# Patient Record
Sex: Male | Born: 1960
Health system: Southern US, Community
[De-identification: ages and names within clinical notes are randomized; demographics above are authoritative.]

## PROBLEM LIST (undated history)

## (undated) DIAGNOSIS — I493 Ventricular premature depolarization: Secondary | ICD-10-CM

## (undated) DIAGNOSIS — I10 Essential (primary) hypertension: Secondary | ICD-10-CM

## (undated) DIAGNOSIS — E785 Hyperlipidemia, unspecified: Secondary | ICD-10-CM

## (undated) DIAGNOSIS — E119 Type 2 diabetes mellitus without complications: Secondary | ICD-10-CM

## (undated) DIAGNOSIS — I2510 Atherosclerotic heart disease of native coronary artery without angina pectoris: Secondary | ICD-10-CM

## (undated) DIAGNOSIS — I5032 Chronic diastolic (congestive) heart failure: Secondary | ICD-10-CM

## (undated) DIAGNOSIS — I4891 Unspecified atrial fibrillation: Secondary | ICD-10-CM

## (undated) DIAGNOSIS — I509 Heart failure, unspecified: Secondary | ICD-10-CM

## (undated) DIAGNOSIS — C801 Malignant (primary) neoplasm, unspecified: Secondary | ICD-10-CM

## (undated) DIAGNOSIS — G4730 Sleep apnea, unspecified: Secondary | ICD-10-CM

## (undated) DIAGNOSIS — I219 Acute myocardial infarction, unspecified: Secondary | ICD-10-CM

## (undated) HISTORY — DX: Atherosclerotic heart disease of native coronary artery without angina pectoris: I25.10

## (undated) HISTORY — PX: INCISIONAL HERNIA REPAIR: SHX193

## (undated) HISTORY — DX: Chronic diastolic (congestive) heart failure: I50.32

## (undated) HISTORY — DX: Hyperlipidemia, unspecified: E78.5

## (undated) HISTORY — PX: ROTATOR CUFF REPAIR: SHX139

## (undated) HISTORY — DX: Ventricular premature depolarization: I49.3

## (undated) HISTORY — DX: Essential (primary) hypertension: I10

## (undated) HISTORY — DX: Type 2 diabetes mellitus without complications: E11.9

## (undated) HISTORY — DX: Sleep apnea, unspecified: G47.30

## (undated) HISTORY — PX: TONSILLECTOMY: SHX5217

## (undated) HISTORY — DX: Heart failure, unspecified: I50.9

## (undated) HISTORY — PX: HERNIA REPAIR: SHX51

## (undated) HISTORY — DX: Malignant (primary) neoplasm, unspecified: C80.1

## (undated) HISTORY — DX: Unspecified atrial fibrillation: I48.91

## (undated) HISTORY — PX: OTHER SURGICAL HISTORY: SHX169

## (undated) HISTORY — PX: HEMORRHOID BANDING: SHX5850

## (undated) HISTORY — PX: SKIN CANCER EXCISION: SHX779

---

## 2002-03-19 HISTORY — PX: CARDIAC CATHETERIZATION: SHX172

## 2002-07-24 ENCOUNTER — Inpatient Hospital Stay (HOSPITAL_COMMUNITY): Admission: AD | Admit: 2002-07-24 | Discharge: 2002-07-27 | Payer: Self-pay | Admitting: Emergency Medicine

## 2002-08-17 ENCOUNTER — Encounter (HOSPITAL_COMMUNITY): Admission: RE | Admit: 2002-08-17 | Discharge: 2002-11-15 | Payer: Self-pay | Admitting: Cardiology

## 2002-11-16 ENCOUNTER — Encounter (HOSPITAL_COMMUNITY): Admission: RE | Admit: 2002-11-16 | Discharge: 2002-12-17 | Payer: Self-pay | Admitting: Cardiology

## 2004-05-09 ENCOUNTER — Ambulatory Visit: Payer: Self-pay | Admitting: Cardiology

## 2004-05-16 ENCOUNTER — Ambulatory Visit: Payer: Self-pay | Admitting: Cardiology

## 2005-09-18 ENCOUNTER — Ambulatory Visit: Payer: Self-pay | Admitting: Cardiology

## 2006-11-06 ENCOUNTER — Ambulatory Visit: Payer: Self-pay | Admitting: Cardiology

## 2006-11-20 ENCOUNTER — Ambulatory Visit: Payer: Self-pay | Admitting: Cardiology

## 2007-10-17 ENCOUNTER — Encounter: Payer: Self-pay | Admitting: Cardiology

## 2007-11-28 ENCOUNTER — Ambulatory Visit: Payer: Self-pay | Admitting: Cardiology

## 2007-12-01 ENCOUNTER — Encounter: Payer: Self-pay | Admitting: Cardiology

## 2007-12-11 ENCOUNTER — Ambulatory Visit: Payer: Self-pay | Admitting: Cardiology

## 2007-12-19 ENCOUNTER — Ambulatory Visit: Payer: Self-pay | Admitting: Cardiovascular Disease

## 2007-12-19 ENCOUNTER — Encounter: Payer: Self-pay | Admitting: Cardiology

## 2007-12-19 ENCOUNTER — Ambulatory Visit: Payer: Self-pay

## 2007-12-26 ENCOUNTER — Ambulatory Visit: Payer: Self-pay | Admitting: Internal Medicine

## 2008-01-02 ENCOUNTER — Ambulatory Visit: Payer: Self-pay | Admitting: Cardiology

## 2008-01-09 ENCOUNTER — Ambulatory Visit: Payer: Self-pay | Admitting: Internal Medicine

## 2008-01-16 ENCOUNTER — Ambulatory Visit: Payer: Self-pay | Admitting: Internal Medicine

## 2008-01-17 ENCOUNTER — Encounter: Payer: Self-pay | Admitting: Cardiology

## 2008-01-20 ENCOUNTER — Ambulatory Visit: Payer: Self-pay | Admitting: Cardiology

## 2008-01-26 ENCOUNTER — Ambulatory Visit: Payer: Self-pay | Admitting: Cardiology

## 2008-02-09 ENCOUNTER — Ambulatory Visit: Payer: Self-pay | Admitting: Internal Medicine

## 2008-02-11 ENCOUNTER — Encounter: Payer: Self-pay | Admitting: Cardiology

## 2008-02-11 ENCOUNTER — Ambulatory Visit: Payer: Self-pay | Admitting: Cardiology

## 2008-02-11 DIAGNOSIS — E781 Pure hyperglyceridemia: Secondary | ICD-10-CM | POA: Insufficient documentation

## 2008-02-11 DIAGNOSIS — I4891 Unspecified atrial fibrillation: Secondary | ICD-10-CM | POA: Insufficient documentation

## 2008-02-11 DIAGNOSIS — I10 Essential (primary) hypertension: Secondary | ICD-10-CM | POA: Insufficient documentation

## 2008-02-11 DIAGNOSIS — I2510 Atherosclerotic heart disease of native coronary artery without angina pectoris: Secondary | ICD-10-CM | POA: Insufficient documentation

## 2008-06-30 ENCOUNTER — Encounter: Payer: Self-pay | Admitting: Cardiology

## 2008-08-18 ENCOUNTER — Ambulatory Visit: Payer: Self-pay | Admitting: Cardiology

## 2008-09-21 ENCOUNTER — Encounter: Payer: Self-pay | Admitting: Cardiology

## 2008-09-27 ENCOUNTER — Ambulatory Visit: Payer: Self-pay | Admitting: Cardiology

## 2008-09-27 ENCOUNTER — Encounter: Payer: Self-pay | Admitting: Cardiology

## 2008-09-27 DIAGNOSIS — E785 Hyperlipidemia, unspecified: Secondary | ICD-10-CM | POA: Insufficient documentation

## 2008-09-30 ENCOUNTER — Ambulatory Visit: Payer: Self-pay | Admitting: Cardiology

## 2008-09-30 LAB — CONVERTED CEMR LAB
POC INR: 1.2
Prothrombin Time: 13.8 s

## 2008-10-07 ENCOUNTER — Ambulatory Visit: Payer: Self-pay | Admitting: Cardiovascular Disease

## 2008-10-07 LAB — CONVERTED CEMR LAB
POC INR: 1.9
Prothrombin Time: 17 s

## 2008-10-14 ENCOUNTER — Ambulatory Visit: Payer: Self-pay | Admitting: Cardiovascular Disease

## 2008-10-14 LAB — CONVERTED CEMR LAB: Prothrombin Time: 22.2 s

## 2008-10-21 ENCOUNTER — Ambulatory Visit: Payer: Self-pay | Admitting: Cardiology

## 2008-10-21 LAB — CONVERTED CEMR LAB: POC INR: 2.3

## 2008-10-28 ENCOUNTER — Ambulatory Visit: Payer: Self-pay | Admitting: Internal Medicine

## 2008-11-01 ENCOUNTER — Encounter: Payer: Self-pay | Admitting: *Deleted

## 2008-11-03 ENCOUNTER — Ambulatory Visit: Payer: Self-pay | Admitting: Cardiology

## 2008-11-05 ENCOUNTER — Encounter: Payer: Self-pay | Admitting: Cardiology

## 2008-11-12 ENCOUNTER — Ambulatory Visit: Payer: Self-pay | Admitting: Cardiovascular Disease

## 2008-11-12 LAB — CONVERTED CEMR LAB: POC INR: 2.4

## 2008-11-18 ENCOUNTER — Ambulatory Visit: Payer: Self-pay | Admitting: Cardiology

## 2008-11-18 LAB — CONVERTED CEMR LAB: POC INR: 2.4

## 2008-11-25 ENCOUNTER — Ambulatory Visit: Payer: Self-pay | Admitting: Cardiovascular Disease

## 2008-11-29 ENCOUNTER — Ambulatory Visit: Payer: Self-pay | Admitting: Cardiology

## 2008-12-02 ENCOUNTER — Encounter: Payer: Self-pay | Admitting: Cardiology

## 2008-12-02 ENCOUNTER — Ambulatory Visit: Payer: Self-pay | Admitting: Cardiology

## 2008-12-02 LAB — CONVERTED CEMR LAB: POC INR: 2.4

## 2008-12-03 ENCOUNTER — Ambulatory Visit: Payer: Self-pay | Admitting: Cardiovascular Disease

## 2008-12-03 ENCOUNTER — Encounter: Payer: Self-pay | Admitting: Cardiology

## 2008-12-03 ENCOUNTER — Ambulatory Visit (HOSPITAL_COMMUNITY): Admission: RE | Admit: 2008-12-03 | Discharge: 2008-12-03 | Payer: Self-pay | Admitting: Cardiovascular Disease

## 2008-12-06 ENCOUNTER — Ambulatory Visit: Payer: Self-pay | Admitting: Cardiology

## 2008-12-06 LAB — CONVERTED CEMR LAB: POC INR: 2.1

## 2008-12-16 ENCOUNTER — Ambulatory Visit: Payer: Self-pay | Admitting: Internal Medicine

## 2008-12-16 LAB — CONVERTED CEMR LAB: POC INR: 2.3

## 2009-01-07 ENCOUNTER — Ambulatory Visit: Payer: Self-pay | Admitting: Cardiology

## 2009-01-25 ENCOUNTER — Encounter: Admission: RE | Admit: 2009-01-25 | Discharge: 2009-01-25 | Payer: Self-pay | Admitting: Family Medicine

## 2009-07-11 ENCOUNTER — Encounter: Payer: Self-pay | Admitting: Cardiology

## 2009-07-18 ENCOUNTER — Ambulatory Visit: Payer: Self-pay | Admitting: Cardiology

## 2009-07-22 ENCOUNTER — Ambulatory Visit: Payer: Self-pay | Admitting: Internal Medicine

## 2009-07-28 ENCOUNTER — Ambulatory Visit: Payer: Self-pay | Admitting: Internal Medicine

## 2009-08-04 ENCOUNTER — Ambulatory Visit: Payer: Self-pay | Admitting: Internal Medicine

## 2009-08-11 ENCOUNTER — Ambulatory Visit: Payer: Self-pay | Admitting: Cardiology

## 2009-08-22 ENCOUNTER — Ambulatory Visit: Payer: Self-pay | Admitting: Internal Medicine

## 2009-08-22 ENCOUNTER — Ambulatory Visit: Payer: Self-pay | Admitting: Cardiology

## 2009-08-22 LAB — CONVERTED CEMR LAB: POC INR: 2.6

## 2009-08-24 ENCOUNTER — Encounter: Payer: Self-pay | Admitting: Cardiology

## 2009-08-24 ENCOUNTER — Ambulatory Visit (HOSPITAL_COMMUNITY): Admission: RE | Admit: 2009-08-24 | Discharge: 2009-08-24 | Payer: Self-pay | Admitting: Cardiovascular Disease

## 2009-08-24 ENCOUNTER — Ambulatory Visit: Payer: Self-pay | Admitting: Cardiovascular Disease

## 2009-08-26 ENCOUNTER — Ambulatory Visit: Payer: Self-pay | Admitting: Cardiovascular Disease

## 2009-08-26 LAB — CONVERTED CEMR LAB: POC INR: 3

## 2009-09-08 ENCOUNTER — Ambulatory Visit: Payer: Self-pay | Admitting: Cardiology

## 2009-09-14 ENCOUNTER — Encounter: Payer: Self-pay | Admitting: Cardiology

## 2009-09-23 ENCOUNTER — Encounter: Payer: Self-pay | Admitting: Cardiology

## 2009-09-29 ENCOUNTER — Ambulatory Visit: Payer: Self-pay | Admitting: Internal Medicine

## 2009-12-12 ENCOUNTER — Encounter: Payer: Self-pay | Admitting: Internal Medicine

## 2009-12-27 ENCOUNTER — Encounter: Payer: Self-pay | Admitting: Cardiology

## 2010-03-09 ENCOUNTER — Encounter: Payer: Self-pay | Admitting: Cardiology

## 2010-03-09 ENCOUNTER — Ambulatory Visit: Payer: Self-pay | Admitting: Cardiology

## 2010-03-17 ENCOUNTER — Ambulatory Visit: Payer: Self-pay | Admitting: Cardiology

## 2010-03-23 ENCOUNTER — Ambulatory Visit: Admission: RE | Admit: 2010-03-23 | Discharge: 2010-03-23 | Payer: Self-pay | Source: Home / Self Care

## 2010-03-23 LAB — CONVERTED CEMR LAB: POC INR: 2.5

## 2010-03-30 ENCOUNTER — Ambulatory Visit: Admission: RE | Admit: 2010-03-30 | Discharge: 2010-03-30 | Payer: Self-pay | Source: Home / Self Care

## 2010-03-30 LAB — CONVERTED CEMR LAB: POC INR: 2.5

## 2010-04-06 ENCOUNTER — Ambulatory Visit: Admission: RE | Admit: 2010-04-06 | Discharge: 2010-04-06 | Payer: Self-pay | Source: Home / Self Care

## 2010-04-06 LAB — CONVERTED CEMR LAB: POC INR: 3.3

## 2010-04-13 ENCOUNTER — Ambulatory Visit: Admission: RE | Admit: 2010-04-13 | Discharge: 2010-04-13 | Payer: Self-pay | Source: Home / Self Care

## 2010-04-13 LAB — CONVERTED CEMR LAB: POC INR: 2.7

## 2010-04-16 LAB — CONVERTED CEMR LAB
Basophils Relative: 0.6 % (ref 0.0–3.0)
CO2: 30 meq/L (ref 19–32)
Chloride: 101 meq/L (ref 96–112)
Eosinophils Absolute: 0.1 10*3/uL (ref 0.0–0.7)
HCT: 45.5 % (ref 39.0–52.0)
Hemoglobin: 15.6 g/dL (ref 13.0–17.0)
Lymphocytes Relative: 34.4 % (ref 12.0–46.0)
Lymphs Abs: 2.5 10*3/uL (ref 0.7–4.0)
MCHC: 34.2 g/dL (ref 30.0–36.0)
MCV: 90.2 fL (ref 78.0–100.0)
Monocytes Absolute: 0.7 10*3/uL (ref 0.1–1.0)
Neutro Abs: 3.9 10*3/uL (ref 1.4–7.7)
RBC: 5.05 M/uL (ref 4.22–5.81)
Sodium: 141 meq/L (ref 135–145)

## 2010-04-18 NOTE — Medication Information (Signed)
Summary: rov/sp  Anticoagulant Therapy  Managed by: Inactive Referring MD: deGent PCP: [facility] [address], Dr. Chilton Si Supervising MD: Graciela Husbands MD, Viviann Spare Indication 1: Atrial Fibrillation (ICD-427.31) Indication 2: Atrial Fibrillation Lab Used: [facility] [place] Site: [address] INR POC 1.6 INR RANGE 2-3  Dietary changes: no    Health status changes: no    Bleeding/hemorrhagic complications: no    Recent/future hospitalizations: yes       Details: cardioversion was on 6/6, coumadin can be d/c 4 weeks post procedure per Dr. Andee Lineman office note from 6/23  Any changes in medication regimen? no    Recent/future dental: no  Any missed doses?: no       Is patient compliant with meds? yes       Allergies: No Known Drug Allergies  Anticoagulation Management History:      The patient is taking warfarin and comes in today for a routine follow up visit.  Negative risk factors for bleeding include an age less than 60 years old.  The bleeding index is 'low risk'.  Positive CHADS2 values include History of HTN.  Negative CHADS2 values include Age > 12 years old.  The start date was 11/28/2007.  His last INR was 5.6 RATIO.  Anticoagulation responsible provider: Graciela Husbands MD, Viviann Spare.  INR POC: 1.6.  Cuvette Lot#: [id number].  Exp: 11/2010.    Anticoagulation Management Assessment/Plan:      The patient's current anticoagulation dose is Coumadin 5 mg tabs: take as directed per coumadin clinic.  The target INR is 2 - 3.  The next INR is due 09/26/2009.  Anticoagulation instructions were given to patient.  Results were reviewed/authorized by Inactive.  He was notified by Dillard Cannon.         Prior Anticoagulation Instructions: INR 3.0  Continue same dose of 1 tablet every day.   Current Anticoagulation Instructions: INR 1.6  Stop coumadin. 4 weeks post procedure.

## 2010-04-18 NOTE — Medication Information (Signed)
Summary: rov/sp  Anticoagulant Therapy  Managed by: Weston Brass, PharmD Referring MD: Andee Lineman PCP: [facility] [address] Dr. Chilton Si Supervising MD: Antoine Poche MD, Fayrene Fearing Indication 1: Atrial Fibrillation (ICD-427.31) Indication 2: Atrial Fibrillation Lab Used: [facility] Site: [facility] INR POC 2.6 INR RANGE 2-3  Dietary changes: no    Health status changes: yes       Details: seeing Dr. Johney Frame today to discuss possible cardioversion  Bleeding/hemorrhagic complications: no    Recent/future hospitalizations: no    Any changes in medication regimen? no    Recent/future dental: no  Any missed doses?: no       Is patient compliant with meds? yes       Allergies: No Known Drug Allergies  Anticoagulation Management History:      The patient is taking warfarin and comes in today for a routine follow up visit.  Negative risk factors for bleeding include an age less than 67 years old.  The bleeding index is 'low risk'.  Positive CHADS2 values include History of HTN.  Negative CHADS2 values include Age > 65 years old.  The start date was 11/28/2007.  His last INR was 5.6 RATIO.  Anticoagulation responsible provider: Antoine Poche MD, Fayrene Fearing.  INR POC: 2.6.  Cuvette Lot#: [id number].  Exp: 10/2010.    Anticoagulation Management Assessment/Plan:      The patient's current anticoagulation dose is Coumadin 5 mg tabs: take as directed per coumadin clinic.  The target INR is 2 - 3.  The next INR is due 08/29/2009.  Anticoagulation instructions were given to patient.  Results were reviewed/authorized by Weston Brass, PharmD.  He was notified by Weston Brass PharmD.         Prior Anticoagulation Instructions: INR 2.7  Continue same dose of 1 tablet every day.    Current Anticoagulation Instructions: INR 2.6  Continue same dose of 1 tablet every day.  F/U after cardioversion.

## 2010-04-18 NOTE — Assessment & Plan Note (Signed)
Summary: nep/afib/eval for ablation   Visit Type:  Initial Consult Referring Provider:  Dr Andee Lineman Primary Provider:  [facility] [address], Dr. Chilton Si  CC:  afib.  History of Present Illness: Scott White is a pleasant 50 yo WM with a h/o persistent atrial fibrillation, CAD and obesity who presents today for EP consultation regarding afib.  He had an acute inferior wall MI 2004 for which he had BMS to RCA.  He did well thereafter.  He reports initially being diagnosed with afib late 2009 after presenting for annual exam with Dr Andee Lineman.  He was cardioverted 11/09 and treated with coumadin.  He did well until 4-5 months later, when he developed sypmtoms of fatigue and decreased exercise tolerance.  He was again cardioverted 9/10.  He reports feeling "much better" after cardioversion.  He was not placed on an antiarrhythmic medicine.  He did well until 4/11 when he again developed fatigue and decreased exercise tolerance.  He was again found to have afib.  He was restarted on coumadin and subsequently on multaq 400mg  two times a day.  He reports tolerating multaq well, without difficulty.  Current Medications (verified): 1)  Fish Oil 1000 Mg Caps (Omega-3 Fatty Acids) .... Take 2 Capsules By Mouth Two Times A Day 2)  Nitroglycerin 0.4 Mg Subl (Nitroglycerin) .... One Tablet Under Tongue Every 5 Minutes As Needed For Chest Pain---May Repeat Times Three 3)  Lisinopril-Hydrochlorothiazide 20-25 Mg Tabs (Lisinopril-Hydrochlorothiazide) .... Take 1 Tablet By Mouth Once A Day 4)  Crestor 5 Mg Tabs (Rosuvastatin Calcium) .... Take One Tablet By Mouth Daily. 5)  Niacin 100 Mg Tabs (Niacin) .... Take 1 Tablet By Mouth Once A Day 6)  Hemp Oil .... Take 2 Tablespoons Once Daily 7)  Coumadin 5 Mg Tabs (Warfarin Sodium) .... Take As Directed Per Coumadin Clinic 8)  Multaq 400 Mg Tabs (Dronedarone Hcl) .... Take 1 Tablet By Mouth Two Times A Day 9)  Aspirin 81 Mg Tbec (Aspirin) .... Take 1 Tablet Daily. 10)   Centrum Cardio  Tabs (Multiple Vitamins-Minerals) .... Take 1 Tablet Twice Daily.  Allergies (verified): No Known Drug Allergies  Past History:  Past Medical History: Persistent atrial fibrillation CHADS2 is one CAD- Status post acute inferior wall myocardial infarction emergent bare metal stenting of the mid RCA, May 2004,  residual nonobstructive coronary artery disease Ejection fraction 60% by echocardiography 04/2007 Hypertension Obesity HL  Past Surgical History: PCI RCA 2004  Family History: maternal family members with CAD/ early CAD (some in their 70s)  Social History: Pt lives in [place] alone.  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  Alcohol Use - 1 beer or glass of wine per day  Review of Systems       All systems are reviewed and negative except as listed in the HPI.   Vital Signs:  Patient profile:   50 year old male Height:      71 inches Weight:      222 pounds BMI:     31.07 Pulse rate:   63 / minute Pulse rhythm:   irregular BP sitting:   126 / 84  (left arm) Cuff size:   regular  Vitals Entered By: Judithe Modest CMA (August 22, 2009 11:41 AM)  Physical Exam  General:  Well developed, well nourished, in no acute distress. Head:  normocephalic and atraumatic Eyes:  PERRLA/EOM intact; conjunctiva and lids normal. Nose:  no deformity, discharge, inflammation, or lesions  Mouth:  Teeth, gums and palate normal. Oral mucosa normal. Neck:  Neck supple, no JVD. No masses, thyromegaly or abnormal cervical nodes. Lungs:  Clear bilaterally to auscultation and percussion. Heart:  irrr, no m/r/g Abdomen:  Bowel sounds positive; abdomen soft and non-tender without masses, organomegaly, or hernias noted. No hepatosplenomegaly. Msk:  Back normal, normal gait. Muscle strength and tone normal. Pulses:  pulses normal in all 4 extremities Extremities:  No clubbing or cyanosis. Neurologic:  Alert and oriented x 3. Skin:   Intact without lesions or rashes. Cervical Nodes:  no significant adenopathy Psych:  Normal affect.   EKG  Procedure date:  08/22/2009  Findings:      afib, V rates 60s,  QT 425,  PVCs  Impression & Recommendations:  Problem # 1:  ATRIAL FIBRILLATION (ICD-427.31) The patient has persistent symptomatic atrial fibrillation.  He has not previoulsy tried an antiarrhythmic medicine.  Therapeutic strategies for afib including medicine and ablation were discussed in detail with the patient today. Risk, benefits, and alternatives to EP study and radiofrequency ablation for afib were also discussed in detail today.  At this point, catheter ablation is reserved as a second line therapy for afib.  We will therefore proceed with cardioversion on multaq.  If he maintains sinus rhythm, then he should continue medical therapy.  If he has further afib, then we should consider catheter ablation at that time.  I have reviewed EKG from 2009 which revealed LA size of <68mm.  Given his persisent afib, I suspect that success rates are not greater than 70 % and would carry a 1/4 chance of needing more procedures.  Problem # 2:  ESSENTIAL HYPERTENSION, BENIGN (ICD-401.1)  stable  His updated medication list for this problem includes:    Lisinopril-hydrochlorothiazide 20-25 Mg Tabs (Lisinopril-hydrochlorothiazide) .[name]... Take 1 tablet by mouth once a day    Aspirin 81 Mg Tbec (Aspirin) .[name]... Take 1 tablet daily.  Orders: TLB-BMP (Basic Metabolic Panel-BMET) (80048-METABOL) TLB-CBC Platelet - w/Differential (85025-CBCD) TLB-PTT (85730-PTTL) TLB-Magnesium (Mg) (83735-MG)  Problem # 3:  CORONARY ATHEROSCLEROSIS NATIVE CORONARY ARTERY (ICD-414.01)  stable  His updated medication list for this problem includes:    Nitroglycerin 0.4 Mg Subl (Nitroglycerin) ..... One tablet under tongue every 5 minutes as needed for chest pain---may repeat times three    Lisinopril-hydrochlorothiazide 20-25 Mg Tabs  (Lisinopril-hydrochlorothiazide) .[name]... Take 1 tablet by mouth once a day    Coumadin 5 Mg Tabs (Warfarin sodium) .[name]... Take as directed per coumadin clinic    Aspirin 81 Mg Tbec (Aspirin) .[name]... Take 1 tablet daily.  Orders: TLB-BMP (Basic Metabolic Panel-BMET) (80048-METABOL) TLB-CBC Platelet - w/Differential (85025-CBCD) TLB-PTT (85730-PTTL) TLB-Magnesium (Mg) (83735-MG)  Other Orders: EKG w/ Interpretation (93000)  Patient Instructions: 1)  Your physician recommends that you schedule a follow-up appointment in: 3 months with Dr Johney Frame 2)  Your physician has recommended that you have a cardioversion (DCCV).  Electrical cardioversion uses a jolt of electricity to your heart either through paddles or wired patches attached to your chest. This is a controlled, usually prescheduled, procedure. Defibrillation is done under light anesthesia in the hospital, and you usually go home the day of the procedure. This is done to get your heart back into a normal rhythm. You are not awake for the procedure. Please see the instruction sheet given to you today.

## 2010-04-18 NOTE — Assessment & Plan Note (Signed)
Summary: 6 MONTH FU   Visit Type:  Follow-up Primary Provider:  [facility] [address], Dr. Chilton Si   History of Present Illness: the patient is a very pleasant 50 year old male with 2 prior cardioversions for paroxysmal atrial fibrillation. The patient also has a history of single-vessel coronary urgency status post bare-metal stent to the mid right coronary artery in 2004. The patient has been cardioverted in November 2009, September 2010 and now has noticed that for approximately months he's been back in atrial fibrillation. The patient is symptomatically due to fibrillation with decreased exercise tolerance and fatigue. The patient works out at [employer] and has noticed increased fatigue and decreased energy level. He denies ever any palpitations, presyncope or syncope. He also reports no recurrent substernal chest pain. An EKG today the patient is having a defibrillation with relative rate control and occasional PVCs.  Preventive Screening-Counseling & Management  Alcohol-Tobacco     Smoking Status: never  Current Medications (verified): 1)  Lipitor 20 Mg Tabs (Atorvastatin Calcium) .... Take One Tablet By Mouth Daily. 2)  Aspirin Ec 325 Mg Tbec (Aspirin) .... Take One Tablet By Mouth Daily 3)  Fish Oil 1000 Mg Caps (Omega-3 Fatty Acids) .... Take 2 Capsules By Mouth Two Times A Day 4)  Centrum Cardio  Tabs (Multiple Vitamins-Minerals) .... Take 1 Tablet By Mouth Two Times A Day 5)  Nitroglycerin 0.4 Mg Subl (Nitroglycerin) .... One Tablet Under Tongue Every 5 Minutes As Needed For Chest Pain---May Repeat Times Three 6)  Lisinopril-Hydrochlorothiazide 20-25 Mg Tabs (Lisinopril-Hydrochlorothiazide) .... Take 1 Tablet By Mouth Once A Day 7)  Crestor 5 Mg Tabs (Rosuvastatin Calcium) .... Take One Tablet By Mouth Daily. 8)  Niacin 100 Mg Tabs (Niacin) .... Take 1 Tablet By Mouth Once A Day 9)  Hemp Oil .... Take 2 Tablespoons Once Daily 10)  Coumadin 5 Mg Tabs (Warfarin Sodium) .... Take As  Directed Per Coumadin Clinic 11)  Multaq 400 Mg Tabs (Dronedarone Hcl) .... Take 1 Tablet By Mouth Two Times A Day  Allergies: No Known Drug Allergies  Comments:  Nurse/Medical Assistant: The patient's medications were reviewed with the patient and were updated in the Medication List. Pt brought a list of medications to office visit.  Cyril Loosen, RN, BSN (Jul 18, 2009 3:01 PM)  Past History:  Past Medical History: Last updated: 09/27/2008 paroxysmal atrial fibrillation with rapid ventricular response. Unknown duration no significant associated symptoms CHADS2 is one single vessel coronary artery disease. Status post acute inferior wall myocardial infarction emergent bare metal stenting of the mid RCA, May 2004 residual nonobstructive coronary artery disease Ejection fraction 60% by echocardiography 04/2007 Hypertension  Family History: Last updated: 01/17/2008 noncontributory  Social History: Last updated: 01/17/2008 Tobacco Use - No.  Alcohol Use - no  Risk Factors: Smoking Status: never (07/18/2009)  Clinical Review Panels:  Cardiac Imaging Cardiac Cath Findings left heart catheterization.  Percutaneous transluminal coronary angioplasty and stent placement in the midright coronary artery.  Placement of an express two stent with improvement of TIMI grade 1 flowTIMI grade 3 (07/24/2002)    Review of Systems       The patient complains of fatigue.  The patient denies malaise, fever, weight gain/loss, vision loss, decreased hearing, hoarseness, chest pain, palpitations, shortness of breath, prolonged cough, wheezing, sleep apnea, coughing up blood, abdominal pain, blood in stool, nausea, vomiting, diarrhea, heartburn, incontinence, blood in urine, muscle weakness, joint pain, leg swelling, rash, skin lesions, headache, fainting, dizziness, depression, anxiety, enlarged lymph nodes,  easy bruising or bleeding, and environmental allergies.    Vital Signs:  Patient  profile:   50 year old male Height:      71 inches Weight:      223.75 pounds BMI:     31.32 Pulse rate:   96 / minute BP sitting:   143 / 79  (left arm) Cuff size:   regular  Vitals Entered By: Cyril Loosen, RN, BSN (Jul 18, 2009 2:58 PM)  Nutrition Counseling: Patient's BMI is greater than 25 and therefore counseled on weight management options. Comments Follow up visit-Back in a.fib   Physical Exam  Additional Exam:  General: Well-developed, well-nourished in no distress head: Normocephalic and atraumatic eyes PERRLA/EOMI intact, conjunctiva and lids normal nose: No deformity or lesions mouth normal dentition, normal posterior pharynx neck: Supple, no JVD.  No masses, thyromegaly or abnormal cervical nodes lungs: Normal breath sounds bilaterally without wheezing.  Normal percussion heart: irregular rate and rhythm with normal S1 and S2, no S3 or S4.  PMI is normal.  No pathological murmurs abdomen: Normal bowel sounds, abdomen is soft and nontender without masses, organomegaly or hernias noted.  No hepatosplenomegaly musculoskeletal: Back normal, normal gait muscle strength and tone normal pulsus: Pulse is normal in all 4 extremities Extremities: No peripheral pitting edema neurologic: Alert and oriented x 3 skin: Intact without lesions or rashes cervical nodes: No significant adenopathy psychologic: Normal affect    EKG  Procedure date:  07/18/2009  Findings:      atrial fibrillation with occasional PVCs heart rate 91 beats per minute  Impression & Recommendations:  Problem # 1:  ATRIAL FIBRILLATION (ICD-427.31) the patient now presents back and recurrent fibrillation he is symptomatic with this. I had a long discussion with the patient about the various options including a strategy of rate control versus rhythm control versus pulmonary vein isolation ablation technique. The patient was interested in ablation but told him that he first will need a consultation with  Dr. Timmie Foerster to discuss the risk and benefits of the procedure. I do not think leaving the patient in atrial fibrillation is a great option as he is symptomatic with this. We will start him back on Coumadin at 5 mg a day and I started patient on dronaderone for 400 mg p.o. b.i.d. to be taken with meals. If the patient does not proceed with ablation then we will have him cardioverted in approximately 4 weeks. The patient does want his cardioversion done in [place] because it is more convenient for him. His updated medication list for this problem includes:    Aspirin Ec 325 Mg Tbec (Aspirin) .[name]... Take one tablet by mouth daily    Coumadin 5 Mg Tabs (Warfarin sodium) .[name]... Take as directed per coumadin clinic    Multaq 400 Mg Tabs (Dronedarone hcl) .[name]... Take 1 tablet by mouth two times a day  Problem # 2:  COUMADIN THERAPY (ICD-V58.61) I start the patient back on Coumadin therapy and thus a patient of cardioversion. PT INR will be followed in the Coumadin clinic in [facility]  Problem # 3:  CORONARY ATHEROSCLEROSIS NATIVE CORONARY ARTERY (ICD-414.01) the patient has known coronary artery disease but has no recurrent chest pain. There is no clear indication for ischemia testing. The following medications were removed from the medication list:    Ramipril 10 Mg Caps (Ramipril) .[name]... Take one capsule by mouth daily His updated medication list for this problem includes:    Aspirin Ec 325 Mg Tbec (Aspirin) .[name]... Take  one tablet by mouth daily    Nitroglycerin 0.4 Mg Subl (Nitroglycerin) ..... One tablet under tongue every 5 minutes as needed for chest pain---may repeat times three    Lisinopril-hydrochlorothiazide 20-25 Mg Tabs (Lisinopril-hydrochlorothiazide) .[name]... Take 1 tablet by mouth once a day    Coumadin 5 Mg Tabs (Warfarin sodium) .[name]... Take as directed per coumadin clinic  Problem # 4:  ESSENTIAL HYPERTENSION, BENIGN (ICD-401.1) the patient is on 2 ACE inhibitors and we will discontinue  Ramipril.  The following medications were removed from the medication list:    Ramipril 10 Mg Caps (Ramipril) .[name]... Take one capsule by mouth daily His updated medication list for this problem includes:    Aspirin Ec 325 Mg Tbec (Aspirin) .[name]... Take one tablet by mouth daily    Lisinopril-hydrochlorothiazide 20-25 Mg Tabs (Lisinopril-hydrochlorothiazide) .[name]... Take 1 tablet by mouth once a day  Other Orders: EKG w/ Interpretation (93000)  Patient Instructions: 1)  Dr. Johney Frame - Monday, June 6 at 12:00 at [facility] office 2)  Begin Coumadin 5mg  every evening  3)  First coumadin check at [facility] on Friday, May 6 at 3:15 4)  Begin Multaq 400mg  two times a day  5)  Stop Ramipril  6)  Follow up in  6-8 weeks Prescriptions: MULTAQ 400 MG TABS (DRONEDARONE HCL) Take 1 tablet by mouth two times a day  #60 x 6   Entered by:   Hoover Brunette, LPN   Authorized by:   Lewayne Bunting, MD, [facility]   Signed by:   Hoover Brunette, LPN on 16/12/9602   Method used:   Electronically to        [facility] (retail)       [address]       [place], [place]  [address]       Ph: [phone] or [phone]       Fax: [phone]   RxID:   [id number] COUMADIN 5 MG TABS (WARFARIN SODIUM) take as directed per coumadin clinic  #30 x 2   Entered by:   Hoover Brunette, LPN   Authorized by:   Lewayne Bunting, MD, [facility]   Signed by:   Hoover Brunette, LPN on 40/34/7425   Method used:   Electronically to        [facility] (retail)       [address]       [place], [place]  [address]       Ph: [phone] or [phone]       Fax: [phone]   RxID:   [id number]

## 2010-04-18 NOTE — Letter (Signed)
Summary: Cardioversion/TEE Instructions  Architectural technologist, Main Office  [address]   [place], [place] [address]   Phone: [phone]  Fax: [phone]     Cardioversion Instructions  You are scheduled for a  Cardioversion on 6/   /11 with Dr. Eden Emms.   Please arrive at the [facility] at          p.m. on the day of your procedure.  1)   DIET:  A)   Nothing to eat or drink after midnight except your medications with a sip of water.   2)   Come to the [place] office on 08/22/09 for lab work. You do not have to be fasting.  3)   MAKE SURE YOU TAKE YOUR COUMADIN.   B)   YOU MAY TAKE ALL of your remaining medications with a small amount of water.   5)  Must have a responsible person to drive you home.  6)   Bring a current list of your medications and current insurance cards.   * Special Note:  Every effort is made to have your procedure done on time. Occasionally there are emergencies that present themselves at the hospital that may cause delays. Please be patient if a delay does occur.  * If you have any questions after you get home, please call the office at [phone]. Anselm Pancoast

## 2010-04-18 NOTE — Medication Information (Signed)
Summary: RX Folder/ FAXED [facility] MULTAQ TABS  RX Folder/ FAXED [facility] MULTAQ TABS   Imported By: Dorise Hiss 09/20/2009 08:49:38  _____________________________________________________________________  External Attachment:    Type:   Image     Comment:   External Document

## 2010-04-18 NOTE — Medication Information (Signed)
Summary: rov/eac  Anticoagulant Therapy  Managed by: Weston Brass, PharmD Referring MD: Andee Lineman PCP: [facility] [address], Dr. Chilton Si Supervising MD: Johney Frame MD, Fayrene Fearing Indication 1: Atrial Fibrillation (ICD-427.31) Indication 2: Atrial Fibrillation Lab Used: [facility] Site: [facility] INR POC 2.1 INR RANGE 2-3  Dietary changes: no    Health status changes: no    Bleeding/hemorrhagic complications: no    Recent/future hospitalizations: yes       Details: pending DCCV, ablation or Tikosyn  Any changes in medication regimen? no    Recent/future dental: no  Any missed doses?: no       Is patient compliant with meds? yes       Allergies: No Known Drug Allergies  Anticoagulation Management History:      The patient is taking warfarin and comes in today for a routine follow up visit.  Negative risk factors for bleeding include an age less than 20 years old.  The bleeding index is 'low risk'.  Positive CHADS2 values include History of HTN.  Negative CHADS2 values include Age > 10 years old.  The start date was 11/28/2007.  His last INR was 5.6 RATIO.  Anticoagulation responsible provider: Evy Lutterman MD, Fayrene Fearing.  INR POC: 2.1.  Cuvette Lot#: [id number].  Exp: 10/2010.    Anticoagulation Management Assessment/Plan:      The patient's current anticoagulation dose is Coumadin 5 mg tabs: take as directed per coumadin clinic.  The target INR is 2 - 3.  The next INR is due 08/04/2009.  Anticoagulation instructions were given to patient.  Results were reviewed/authorized by Weston Brass, PharmD.  He was notified by Weston Brass PharmD.         Prior Anticoagulation Instructions: INR 1.3  Take 1.5 tablets today and tomorrow.  Then return to taking 1 tablet (5 mg) every day.  Return to clinic in 1 week.    Current Anticoagulation Instructions: INR 2.1  Continue same dose of 1 tablet daily

## 2010-04-18 NOTE — Medication Information (Signed)
Summary: rov/cb s/p DCCV  Anticoagulant Therapy  Managed by: Weston Brass, PharmD Referring MD: Andee Lineman PCP: [facility] [address], Dr. Chilton Si Supervising MD: Clifton James MD, Cristal Deer Indication 1: Atrial Fibrillation (ICD-427.31) Indication 2: Atrial Fibrillation Lab Used: [facility] [place] Site: [facility] INR POC 3.0 INR RANGE 2-3  Dietary changes: no    Health status changes: yes       Details: had DCCV on Wednesday.  INR was 1.77.  Unsure why.  Took 7.5mg  x 2 days and Lovenox until last night  Bleeding/hemorrhagic complications: no    Recent/future hospitalizations: no    Any changes in medication regimen? yes       Details: started Multaq after cardioversion  Recent/future dental: no  Any missed doses?: no       Is patient compliant with meds? yes       Allergies: No Known Drug Allergies  Anticoagulation Management History:      The patient is taking warfarin and comes in today for a routine follow up visit.  Negative risk factors for bleeding include an age less than 68 years old.  The bleeding index is 'low risk'.  Positive CHADS2 values include History of HTN.  Negative CHADS2 values include Age > 15 years old.  The start date was 11/28/2007.  His last INR was 5.6 RATIO.  Anticoagulation responsible provider: Clifton James MD, Cristal Deer.  INR POC: 3.0.  Cuvette Lot#: [id number].  Exp: 10/2010.    Anticoagulation Management Assessment/Plan:      The patient's current anticoagulation dose is Coumadin 5 mg tabs: take as directed per coumadin clinic.  The target INR is 2 - 3.  The next INR is due 09/26/2009.  Anticoagulation instructions were given to patient.  Results were reviewed/authorized by Weston Brass, PharmD.  He was notified by Weston Brass PharmD.         Prior Anticoagulation Instructions: INR 2.6  Continue same dose of 1 tablet every day.  F/U after cardioversion.   Current Anticoagulation Instructions: INR 3.0  Continue same dose of 1 tablet  every day.

## 2010-04-18 NOTE — Assessment & Plan Note (Signed)
Summary: 6 TO 8 WKS   Visit Type:  Follow-up Referring Provider:  Dr Andee Lineman Primary Provider:  [facility] [address] Dr. Chilton Si   History of Present Illness: the patient is a 50 year old male with a history of symptomatic atrial fibrillation. He has failed prior cardioversions. I placed the patient on dronaderone and referred for an elective cardioversion. I also asked Dr. Johney Frame for a second opinion regarding the possibility of pulmonaryr vein isolation for management of a fibrillation.he felt the patient should be treated first with antiarrhythmic drug therapy.however, the patient has made it clear to me already that he feels dronaderone he will not consider dofetilide. The patient has remained in normal sinus rhythm. However towards evening he notices frequent extrasystoles. His EKG today also shows frequent PVCs. He is much improved however, including his exercise tolerance and shortness of breath.  Preventive Screening-Counseling & Management  Alcohol-Tobacco     Smoking Status: never  Current Medications (verified): 1)  Fish Oil 1000 Mg Caps (Omega-3 Fatty Acids) .... Take 2 Capsules By Mouth Two Times A Day 2)  Nitroglycerin 0.4 Mg Subl (Nitroglycerin) .... One Tablet Under Tongue Every 5 Minutes As Needed For Chest Pain---May Repeat Times Three 3)  Lisinopril-Hydrochlorothiazide 20-25 Mg Tabs (Lisinopril-Hydrochlorothiazide) .... Take 1 Tablet By Mouth Once A Day 4)  Crestor 5 Mg Tabs (Rosuvastatin Calcium) .... Take One Tablet By Mouth Daily. 5)  Niacin 100 Mg Tabs (Niacin) .... Take 1 Tablet By Mouth Once A Day 6)  Hemp Oil .... Take 2 Tablespoons Once Daily 7)  Coumadin 5 Mg Tabs (Warfarin Sodium) .... Take As Directed Per Coumadin Clinic 8)  Multaq 400 Mg Tabs (Dronedarone Hcl) .... Take 1 Tablet By Mouth Two Times A Day 9)  Aspirin 81 Mg Tbec (Aspirin) .... Take 1 Tablet Daily. 10)  Centrum Cardio  Tabs (Multiple Vitamins-Minerals) .... Take 1 Tablet Twice Daily. 11)   Ramipril 10 Mg Caps (Ramipril) .... Take 1 Tablet By Mouth Once A Day  Allergies (verified): No Known Drug Allergies  Comments:  Nurse/Medical Assistant: The patient's medication list and allergies were reviewed with the patient and were updated in the Medication and Allergy Lists.  Past History:  Past Medical History: Last updated: 08/22/2009 Persistent atrial fibrillation CHADS2 is one CAD- Status post acute inferior wall myocardial infarction emergent bare metal stenting of the mid RCA, May 2004,  residual nonobstructive coronary artery disease Ejection fraction 60% by echocardiography 04/2007 Hypertension Obesity HL  Past Surgical History: Last updated: 08/22/2009 PCI RCA 2004  Family History: Last updated: 08/22/2009 maternal family members with CAD/ early CAD (some in their 30s)  Social History: Last updated: 08/22/2009 Pt lives in [place] alone.  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  Alcohol Use - 1 beer or glass of wine per day  Risk Factors: Smoking Status: never (09/08/2009)  Review of Systems  The patient denies malaise, fever, weight gain/loss, vision loss, decreased hearing, hoarseness, chest pain, palpitations, shortness of breath, prolonged cough, wheezing, sleep apnea, coughing up blood, abdominal pain, blood in stool, nausea, vomiting, diarrhea, heartburn, incontinence, blood in urine, muscle weakness, joint pain, leg swelling, rash, skin lesions, headache, fainting, dizziness, depression, anxiety, enlarged lymph nodes, easy bruising or bleeding, and environmental allergies.    Vital Signs:  Patient profile:   50 year old male Height:      71 inches Weight:      221 pounds Pulse rate:   73 / minute BP  sitting:   119 / 74  (left arm) Cuff size:   large  Vitals Entered By: Carlye Grippe (September 08, 2009 1:18 PM)  Physical Exam  Additional Exam:  General: Well-developed, well-nourished in no  distress head: Normocephalic and atraumatic eyes PERRLA/EOMI intact, conjunctiva and lids normal nose: No deformity or lesions mouth normal dentition, normal posterior pharynx neck: Supple, no JVD.  No masses, thyromegaly or abnormal cervical nodes lungs: Normal breath sounds bilaterally without wheezing.  Normal percussion heart:regular rate and rhythm with normal S1 and S2, no S3 or S4.  PMI is normal.  No pathological murmurs abdomen: Normal bowel sounds, abdomen is soft and nontender without masses, organomegaly or hernias noted.  No hepatosplenomegaly musculoskeletal: Back normal, normal gait muscle strength and tone normal pulsus: Pulse is normal in all 4 extremities Extremities: No peripheral pitting edema neurologic: Alert and oriented x 3 skin: Intact without lesions or rashes cervical nodes: No significant adenopathy psychologic: Normal affect    Impression & Recommendations:  Problem # 1:  COUMADIN THERAPY (ICD-V58.61) Coumadin therapy can be discontinued 4 weeks after cardioversion.  Problem # 2:  CORONARY ATHEROSCLEROSIS NATIVE CORONARY ARTERY (ICD-414.01) the patient has a prior history of inferior wall microinfarction. He has normal ejection fraction. We'll continue medical therapy. His updated medication list for this problem includes:    Nitroglycerin 0.4 Mg Subl (Nitroglycerin) ..... One tablet under tongue every 5 minutes as needed for chest pain---may repeat times three    Lisinopril-hydrochlorothiazide 20-25 Mg Tabs (Lisinopril-hydrochlorothiazide) .[name]... Take 1 tablet by mouth once a day    Coumadin 5 Mg Tabs (Warfarin sodium) .[name]... Take as directed per coumadin clinic    Aspirin 81 Mg Tbec (Aspirin) .[name]... Take 1 tablet daily.    Ramipril 10 Mg Caps (Ramipril) .[name]... Take 1 tablet by mouth once a day    Metoprolol Succinate 25 Mg Xr24h-tab (Metoprolol succinate) .[name]... Take 1/2 tablet by mouth every evening  Problem # 3:  ESSENTIAL HYPERTENSION, BENIGN  (ICD-401.1) blood pressure is well controlled. His updated medication list for this problem includes:    Lisinopril-hydrochlorothiazide 20-25 Mg Tabs (Lisinopril-hydrochlorothiazide) .[name]... Take 1 tablet by mouth once a day    Aspirin 81 Mg Tbec (Aspirin) .[name]... Take 1 tablet daily.    Ramipril 10 Mg Caps (Ramipril) .[name]... Take 1 tablet by mouth once a day    Metoprolol Succinate 25 Mg Xr24h-tab (Metoprolol succinate) .[name]... Take 1/2 tablet by mouth every evening  Problem # 4:  ATRIAL FIBRILLATION (ICD-427.31) continue dronaderone His updated medication list for this problem includes:    Coumadin 5 Mg Tabs (Warfarin sodium) .[name]... Take as directed per coumadin clinic    Multaq 400 Mg Tabs (Dronedarone hcl) .[name]... Take 1 tablet by mouth two times a day    Aspirin 81 Mg Tbec (Aspirin) .[name]... Take 1 tablet daily.    Metoprolol Succinate 25 Mg Xr24h-tab (Metoprolol succinate) .[name]... Take 1/2 tablet by mouth every evening  Patient Instructions: 1)  Take Metoprolol ER 25mg  1/2 tablet by mouth every evening. 2)  Your physician wants you to follow-up in: 6 months. You will receive a reminder letter in the mail one-two months in advance. If you don't receive a letter, please call our office to schedule the follow-up appointment. Prescriptions: METOPROLOL SUCCINATE 25 MG XR24H-TAB (METOPROLOL SUCCINATE) Take 1/2 tablet by mouth every evening  #15 x 6   Entered by:   Cyril Loosen, RN, BSN   Authorized by:   Lewayne Bunting, MD, [facility]   Signed by:  Cyril Loosen, RN, BSN on 09/08/2009   Method used:   Electronically to        [id number] (retail)       [address]       [place], [place]  [address]       Ph: [phone] or [phone]       Fax: [phone]   RxID:   [id number]  I have personnaly reviewed all medications changes and approved new prescriptions and refills. Lewayne Bunting, MD, [facility]  September 08, 2009 1:50 PM

## 2010-04-18 NOTE — Medication Information (Signed)
Summary: Coumadin Clinic  Anticoagulant Therapy  Managed by: Inactive Referring MD: Orhan Mayorga PCP: [facility] [address], Dr. Chilton Si Supervising MD: Clifton James MD, Cristal Deer Indication 1: Atrial Fibrillation (ICD-427.31) Indication 2: Atrial Fibrillation Lab Used: [facility] [place] Site: [facility] INR RANGE 2-3          Comments: Coumadin stopped per Dr. Andee Lineman  Allergies: No Known Drug Allergies  Anticoagulation Management History:      Negative risk factors for bleeding include an age less than 33 years old.  The bleeding index is 'low risk'.  Positive CHADS2 values include History of HTN.  Negative CHADS2 values include Age > 3 years old.  The start date was 11/28/2007.  His last INR was 5.6 RATIO.  Anticoagulation responsible provider: Clifton James MD, Cristal Deer.  Exp: 10/2010.    Anticoagulation Management Assessment/Plan:      The patient's current anticoagulation dose is Coumadin 5 mg tabs: take as directed per coumadin clinic.  The target INR is 2 - 3.  The next INR is due 09/26/2009.  Anticoagulation instructions were given to patient.  Results were reviewed/authorized by Inactive.         Prior Anticoagulation Instructions: INR 3.0  Continue same dose of 1 tablet every day.

## 2010-04-18 NOTE — Medication Information (Signed)
Summary: rov/eac  Anticoagulant Therapy  Managed by: Weston Brass, PharmD Referring MD: Andee Lineman PCP: [facility] [address], Dr. Chilton Si Supervising MD: Daleen Squibb MD, Maisie Fus Indication 1: Atrial Fibrillation (ICD-427.31) Indication 2: Atrial Fibrillation Lab Used: [facility] Site: [facility] INR POC 2.7 INR RANGE 2-3  Dietary changes: no    Health status changes: no    Bleeding/hemorrhagic complications: no    Recent/future hospitalizations: no    Any changes in medication regimen? no    Recent/future dental: no  Any missed doses?: no       Is patient compliant with meds? yes       Allergies: No Known Drug Allergies  Anticoagulation Management History:      The patient is taking warfarin and comes in today for a routine follow up visit.  Negative risk factors for bleeding include an age less than 51 years old.  The bleeding index is 'low risk'.  Positive CHADS2 values include History of HTN.  Negative CHADS2 values include Age > 35 years old.  The start date was 11/28/2007.  His last INR was 5.6 RATIO.  Anticoagulation responsible provider: Daleen Squibb MD, Maisie Fus.  INR POC: 2.7.  Cuvette Lot#: [id number].  Exp: 10/2010.    Anticoagulation Management Assessment/Plan:      The patient's current anticoagulation dose is Coumadin 5 mg tabs: take as directed per coumadin clinic.  The target INR is 2 - 3.  The next INR is due 08/18/2009.  Anticoagulation instructions were given to patient.  Results were reviewed/authorized by Weston Brass, PharmD.  He was notified by Weston Brass PharmD.         Prior Anticoagulation Instructions: INR 2.4  Continue taking 1 tablet (5 mg) daily.  Return to clinic in 1 week.  Pt pending either DCCV or possibly ablation.  Current Anticoagulation Instructions: INR 2.7  Continue same dose of 1 tablet every day.

## 2010-04-18 NOTE — Consult Note (Signed)
Summary: Consultation Report/ TEE  Consultation Report/ TEE   Imported By: Dorise Hiss 09/08/2009 12:09:18  _____________________________________________________________________  External Attachment:    Type:   Image     Comment:   External Document

## 2010-04-18 NOTE — Medication Information (Signed)
Summary: rov/sp  Anticoagulant Therapy  Managed by: Eda Keys, PharmD Referring MD: deGent PCP: [facility] [address], Dr. Chilton Si Supervising MD: Ladona Ridgel MD, Sharlot Gowda Indication 1: Atrial Fibrillation (ICD-427.31) Indication 2: Atrial Fibrillation Lab Used: [facility] [place] Site: [facility] INR POC 2.4 INR RANGE 2-3  Dietary changes: no    Health status changes: no    Bleeding/hemorrhagic complications: no    Recent/future hospitalizations: no    Any changes in medication regimen? no    Recent/future dental: no  Any missed doses?: no       Is patient compliant with meds? yes      Comments: Patient pending DCCV or other method of obtaining SR (tikosyn or ablation).  Pt meeting with Dr. Johney Frame in early June to discuss options.    Allergies: No Known Drug Allergies  Anticoagulation Management History:      The patient is taking warfarin and comes in today for a routine follow up visit.  Negative risk factors for bleeding include an age less than 37 years old.  The bleeding index is 'low risk'.  Positive CHADS2 values include History of HTN.  Negative CHADS2 values include Age > 62 years old.  The start date was 11/28/2007.  His last INR was 5.6 RATIO.  Anticoagulation responsible provider: Ladona Ridgel MD, Sharlot Gowda.  INR POC: 2.4.  Cuvette Lot#: [id number].  Exp: 10/2010.    Anticoagulation Management Assessment/Plan:      The patient's current anticoagulation dose is Coumadin 5 mg tabs: take as directed per coumadin clinic.  The target INR is 2 - 3.  The next INR is due 08/11/2009.  Anticoagulation instructions were given to patient.  Results were reviewed/authorized by Eda Keys, PharmD.  He was notified by Eda Keys.         Prior Anticoagulation Instructions: INR 2.1  Continue same dose of 1 tablet daily   Current Anticoagulation Instructions: INR 2.4  Continue taking 1 tablet (5 mg) daily.  Return to clinic in 1 week.  Pt pending either DCCV or  possibly ablation.

## 2010-04-18 NOTE — Medication Information (Signed)
Summary: restart 5mg s 07/18/09 Afib-Dr Degent  Anticoagulant Therapy  Managed by: Eda Keys, PharmD Referring MD: deGent PCP: [facility] [address], Dr. Chilton Si Supervising MD: Tenny Craw MD, Gunnar Fusi Indication 1: Atrial Fibrillation (ICD-427.31) Indication 2: Atrial Fibrillation Lab Used: [facility] [place] Site: [facility] INR POC 1.3 INR RANGE 2-3  Dietary changes: no    Health status changes: no    Bleeding/hemorrhagic complications: no    Recent/future hospitalizations: no    Any changes in medication regimen? no    Recent/future dental: no  Any missed doses?: no       Is patient compliant with meds? yes      Comments: Pt started back on coumadin this past monday 07/18/09 with 5 mg daily.  pt has been on coumadin previously and is aware of all education regarding coumadin therapy.    Current Medications (verified): 1)  Fish Oil 1000 Mg Caps (Omega-3 Fatty Acids) .... Take 2 Capsules By Mouth Two Times A Day 2)  Centrum Cardio  Tabs (Multiple Vitamins-Minerals) .... Take 1 Tablet By Mouth Two Times A Day 3)  Nitroglycerin 0.4 Mg Subl (Nitroglycerin) .... One Tablet Under Tongue Every 5 Minutes As Needed For Chest Pain---May Repeat Times Three 4)  Lisinopril-Hydrochlorothiazide 20-25 Mg Tabs (Lisinopril-Hydrochlorothiazide) .... Take 1 Tablet By Mouth Once A Day 5)  Crestor 5 Mg Tabs (Rosuvastatin Calcium) .... Take One Tablet By Mouth Daily. 6)  Niacin 100 Mg Tabs (Niacin) .... Take 1 Tablet By Mouth Once A Day 7)  Hemp Oil .... Take 2 Tablespoons Once Daily 8)  Coumadin 5 Mg Tabs (Warfarin Sodium) .... Take As Directed Per Coumadin Clinic 9)  Multaq 400 Mg Tabs (Dronedarone Hcl) .... Take 1 Tablet By Mouth Two Times A Day 10)  Aspirin 81 Mg Tbec (Aspirin) .... Take 1 Tablet Daily. 11)  Centrum Cardio  Tabs (Multiple Vitamins-Minerals) .... Take 1 Tablet Twice Daily.  Allergies (verified): No Known Drug Allergies  Anticoagulation Management History:  The patient comes in today for his initial visit for anticoagulation therapy.  Negative risk factors for bleeding include an age less than 18 years old.  The bleeding index is 'low risk'.  Positive CHADS2 values include History of HTN.  Negative CHADS2 values include Age > 24 years old.  The start date was 11/28/2007.  His last INR was 5.6 RATIO.  Anticoagulation responsible provider: Tenny Craw MD, Gunnar Fusi.  INR POC: 1.3.  Cuvette Lot#: [id number].  Exp: 08/2010.    Anticoagulation Management Assessment/Plan:      The patient's current anticoagulation dose is Coumadin 5 mg tabs: take as directed per coumadin clinic.  The target INR is 2 - 3.  The next INR is due 07/28/2009.  Anticoagulation instructions were given to patient.  Results were reviewed/authorized by Eda Keys, PharmD.  He was notified by Eda Keys.         Prior Anticoagulation Instructions: Coumadin discontinued at office visit with Dr Diona Browner today  Current Anticoagulation Instructions: INR 1.3  Take 1.5 tablets today and tomorrow.  Then return to taking 1 tablet (5 mg) every day.  Return to clinic in 1 week.

## 2010-04-18 NOTE — Miscellaneous (Signed)
Summary: Multaq Review  Clinical Lists Changes   Review of pt on Multaq   EP Quality review of patient taking Multaq.  I have reviewed clinical chart which reveals that Scott White is doing well on Multaq and receiving clinical benefit.  His EF is preserved and he has no symptoms of CHF.  He is maintaining sinus rhythm with multaq.  I do not see recent LFTs obtained in our office.   He will continue multaq and follow with Dr Andee Lineman.  If he fails medical therapy with multaq or develops side effects of this medicine, then we could consider catheter ablation. He will need LFTs obtained for surveillance on multaq.  Appended Document: Multaq Review schedule LFT's q 6 months  Appended Document: Multaq Review Left message to return call.   Appended Document: Multaq Review Patient notified.   Will send info to Dr. Chilton [facility] with [facility] [place].  He will contact them in the next week to have this lab done there as he works in [employer] and will be more convenient.

## 2010-04-19 ENCOUNTER — Telehealth (INDEPENDENT_AMBULATORY_CARE_PROVIDER_SITE_OTHER): Payer: Self-pay | Admitting: *Deleted

## 2010-04-19 ENCOUNTER — Encounter: Payer: Self-pay | Admitting: Cardiology

## 2010-04-20 ENCOUNTER — Ambulatory Visit: Payer: Self-pay | Admitting: Cardiology

## 2010-04-20 ENCOUNTER — Ambulatory Visit: Admit: 2010-04-20 | Payer: Self-pay | Admitting: Cardiology

## 2010-04-20 NOTE — Medication Information (Signed)
Summary: rov/ewj  Anticoagulant Therapy  Managed by: Weston Brass, PharmD Referring MD: Andee Lineman PCP: [facility] [address], Dr. Chilton Si Supervising MD: Myrtis Ser MD, Tinnie Gens Indication 1: Atrial Fibrillation (ICD-427.31) Indication 2: Atrial Fibrillation Lab Used: [facility] [place] Site: [address] INR POC 2.5 INR RANGE 2-3  Dietary changes: no    Health status changes: no    Bleeding/hemorrhagic complications: no    Recent/future hospitalizations: no    Any changes in medication regimen? no    Recent/future dental: no  Any missed doses?: no       Is patient compliant with meds? yes       Allergies: No Known Drug Allergies  Anticoagulation Management History:      The patient is taking warfarin and comes in today for a routine follow up visit.  Negative risk factors for bleeding include an age less than 76 years old.  The bleeding index is 'low risk'.  Positive CHADS2 values include History of HTN.  Negative CHADS2 values include Age > 64 years old.  The start date was 11/28/2007.  His last INR was 5.6 RATIO.  Anticoagulation responsible provider: Myrtis Ser MD, Tinnie Gens.  INR POC: 2.5.  Cuvette Lot#: [id number].  Exp: 04/2011.    Anticoagulation Management Assessment/Plan:      The patient's current anticoagulation dose is Coumadin 5 mg tabs: take as directed per coumadin clinic.  The target INR is 2 - 3.  The next INR is due 03/30/2010.  Anticoagulation instructions were given to patient.  Results were reviewed/authorized by Weston Brass, PharmD.  He was notified by Stephannie Peters, PharmD Candidate .         Prior Anticoagulation Instructions: INR 1.6  Take 1.5 tablets today, then start taking 1 tablet daily except 1.5 tablets on Mondays.  Recheck in 1 week.   Current Anticoagulation Instructions: INR 2.5   Coumadin 5 mg tablets - Continue 1 tablet daily except 1.5 tablets on Mondays

## 2010-04-20 NOTE — Assessment & Plan Note (Addendum)
Summary: 6 MO FUL   Visit Type:  Follow-up Referring Yechiel Erny:  Dr Andee Lineman Primary Chrisotpher Rivero:  [facility] [address] Dr. Chilton Si   History of Present Illness: the patient is a 50 year old male with a history of atrial fibrillation as well as coronary artery disease status-post inferior wall myocardial infarction the bare-metal stent placement of the mid RCA in May of 2004.  His LV function is 60% by echocardiogram in 2009.  From a coronary disease perspective he has been stable.the patient has a history of fibrillation and has failed prior cardioversions.  He was then placed on dronaderone and was also referred her to Dr. Johney Frame for a second opinion regarding the possibility of pulmonary vein isolation.  The patient has declined the use of dofetilide.  During his last office visit he was in normal sinus rhythm, unfortunately now is back in atrial fibrillation.  His heart rate is controlled.  He reports to me that he did not take his dronaderone for several days and is at this time that he suspects he may have converted back to atrial fibrillation.  He has felt his pulse to be irregular.  He denies however any palpitations shortness of breath orthopnea or PND.  Preventive Screening-Counseling & Management  Alcohol-Tobacco     Smoking Status: never  Current Medications (verified): 1)  Fish Oil Double Strength 1200 Mg Caps (Omega-3 Fatty Acids) .... Take 3 Tablet By Mouth Two Times A Day 2)  Nitroglycerin 0.4 Mg Subl (Nitroglycerin) .... One Tablet Under Tongue Every 5 Minutes As Needed For Chest Pain---May Repeat Times Three 3)  Lisinopril-Hydrochlorothiazide 20-25 Mg Tabs (Lisinopril-Hydrochlorothiazide) .... Take 1 Tablet By Mouth Once A Day 4)  Crestor 5 Mg Tabs (Rosuvastatin Calcium) .... Take One Tablet By Mouth Daily. 5)  Niacin 100 Mg Tabs (Niacin) .... Take 1 Tablet By Mouth Once A Day 6)  Multaq 400 Mg Tabs (Dronedarone Hcl) .... Take 1 Tablet By Mouth Two Times A Day 7)  Aspirin 81 Mg  Tbec (Aspirin) .... Take 1 Tablet Daily. 8)  Centrum Cardio  Tabs (Multiple Vitamins-Minerals) .... Take 1 Tablet Twice Daily. 9)  Metoprolol Succinate 25 Mg Xr24h-Tab (Metoprolol Succinate) .... Take 1 Tablet By Mouth Once A Day 10)  Coumadin 5 Mg Tabs (Warfarin Sodium) .... Take As Directed Per Coumadin Clinic  Allergies (verified): No Known Drug Allergies  Comments:  Nurse/Medical Assistant: The patient's medication list and allergies were reviewed with the patient and were updated in the Medication and Allergy Lists.  Past History:  Past Medical History: Last updated: 08/22/2009 Persistent atrial fibrillation CHADS2 is one CAD- Status post acute inferior wall myocardial infarction emergent bare metal stenting of the mid RCA, May 2004,  residual nonobstructive coronary artery disease Ejection fraction 60% by echocardiography 04/2007 Hypertension Obesity HL  Past Surgical History: Last updated: 08/22/2009 PCI RCA 2004  Family History: Last updated: 08/22/2009 maternal family members with CAD/ early CAD (some in their 30s)  Social History: Last updated: 08/22/2009 Pt lives in [place] alone.  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  Alcohol Use - 1 beer or glass of wine per day  Risk Factors: Smoking Status: never (03/09/2010)  Review of Systems       The patient complains of fatigue.  The patient denies malaise, fever, weight gain/loss, vision loss, decreased hearing, hoarseness, chest pain, palpitations, shortness of breath, prolonged cough, wheezing, sleep apnea, coughing up blood, abdominal pain, blood in stool, nausea, vomiting, diarrhea,  heartburn, incontinence, blood in urine, muscle weakness, joint pain, leg swelling, rash, skin lesions, headache, fainting, dizziness, depression, anxiety, enlarged lymph nodes, easy bruising or bleeding, and environmental allergies.    Vital Signs:  Patient profile:   50 year old  male Height:      71 inches Weight:      222 pounds Pulse rate:   87 / minute BP sitting:   136 / 88  (left arm) Cuff size:   large  Vitals Entered By: Carlye Grippe (March 09, 2010 3:31 PM)  Physical Exam  Additional Exam:  General: Well-developed, well-nourished in no distress head: Normocephalic and atraumatic eyes PERRLA/EOMI intact, conjunctiva and lids normal nose: No deformity or lesions mouth normal dentition, normal posterior pharynx neck: Supple, no JVD.  No masses, thyromegaly or abnormal cervical nodes lungs: Normal breath sounds bilaterally without wheezing.  Normal percussion heart:irregular rate and rhythm with normal S1 and S2, no S3 or S4.  PMI is normal.  No pathological murmurs abdomen: Normal bowel sounds, abdomen is soft and nontender without masses, organomegaly or hernias noted.  No hepatosplenomegaly musculoskeletal: Back normal, normal gait muscle strength and tone normal pulsus: Pulse is normal in all 4 extremities Extremities: No peripheral pitting edema neurologic: Alert and oriented x 3 skin: Intact without lesions or rashes cervical nodes: No significant adenopathy psychologic: Normal affect    Impression & Recommendations:  Problem # 1:  COUMADIN THERAPY (ICD-V58.61) the patient will be restarted on Coumadin in anticipation of repeat cardioversion on dronaderone.  if he fails this attempt he will be referred back to Dr. Johney Frame for consideration of pulmonary vein isolation particularly as the patient declines the use of dofetilide.  Problem # 2:  ATRIAL FIBRILLATION (ICD-427.31) as outlined above.  Heart rate is controlled.  Patient will be restarted on Coumadin in anticipation of an elective cardioversion.  I also asked the patient to be very compliant with his dronaderone therapy. The following medications were removed from the medication list:    Coumadin 5 Mg Tabs (Warfarin sodium) .[name]... Take as directed per coumadin clinic    Metoprolol  Succinate 25 Mg Xr24h-tab (Metoprolol succinate) .[name]... Take 1/2 tablet by mouth every evening His updated medication list for this problem includes:    Multaq 400 Mg Tabs (Dronedarone hcl) .[name]... Take 1 tablet by mouth two times a day    Aspirin 81 Mg Tbec (Aspirin) .[name]... Take 1 tablet daily.    Metoprolol Succinate 25 Mg Xr24h-tab (Metoprolol succinate) .[name]... Take 1 tablet by mouth once a day    Coumadin 5 Mg Tabs (Warfarin sodium) .[name]... Take as directed per coumadin clinic  Orders: EKG w/ Interpretation (93000)  Problem # 3:  CORONARY ATHEROSCLEROSIS NATIVE CORONARY ARTERY (ICD-414.01) the patient reports no recurrent chest pain.  Has been stable from a coronary disease perspective. The following medications were removed from the medication list:    Coumadin 5 Mg Tabs (Warfarin sodium) .[name]... Take as directed per coumadin clinic    Ramipril 10 Mg Caps (Ramipril) .[name]... Take 1 tablet by mouth once a day    Metoprolol Succinate 25 Mg Xr24h-tab (Metoprolol succinate) .[name]... Take 1/2 tablet by mouth every evening His updated medication list for this problem includes:    Nitroglycerin 0.4 Mg Subl (Nitroglycerin) ..... One tablet under tongue every 5 minutes as needed for chest pain---may repeat times three    Lisinopril-hydrochlorothiazide 20-25 Mg Tabs (Lisinopril-hydrochlorothiazide) .[name]... Take 1 tablet by mouth once a day    Aspirin 81 Mg Tbec (  Aspirin) .[name]... Take 1 tablet daily.    Metoprolol Succinate 25 Mg Xr24h-tab (Metoprolol succinate) .[name]... Take 1 tablet by mouth once a day    Coumadin 5 Mg Tabs (Warfarin sodium) .[name]... Take as directed per coumadin clinic  Patient Instructions: 1)  Metoprolol ER 25mg  daily 2)  Restart Coumadin 5mg  every evening - coumadin clinic in [place] 3)  Cardioversion after 4 weeks therapeutic  4)  Follow up in  1 month Prescriptions: COUMADIN 5 MG TABS (WARFARIN SODIUM) take as directed per coumadin clinic  #30 x 3   Entered by:   Hoover Brunette, LPN   Authorized by:    Lewayne Bunting, MD, [facility]   Signed by:   Hoover Brunette, LPN on 40/98/1191   Method used:   Electronically to        [facility] (retail)       [address]       [place], [place]  [address]       Ph: [phone] or [phone]       Fax: [phone]   RxID:   [id number] METOPROLOL SUCCINATE 25 MG XR24H-TAB (METOPROLOL SUCCINATE) Take 1 tablet by mouth once a day  #30 x 6   Entered by:   Hoover Brunette, LPN   Authorized by:   Lewayne Bunting, MD, [facility]   Signed by:   Hoover Brunette, LPN on 56/38/7564   Method used:   Electronically to        [facility] (retail)       [address]       [place], [place]  [address]       Ph: [phone] or [phone]       Fax: [phone]   RxID:   [id number]   Appended Document: 6 MO FUL the patient has been scheduled for an elective cardioversion on April 25, 2010 with Dr. Jens Som.  The patient is followed closely in the Coumadin clinic in [place].  He has been therapeutic.  There have been no changes in the history and physical on this patient.  This notes are serves as an updated addendum the last clinic note.

## 2010-04-20 NOTE — Medication Information (Signed)
Summary: restarting coumadin 12/22/sp  Anticoagulant Therapy  Managed by: Cloyde Reams, RN, BSN Referring MD: deGent PCP: [facility] [address], Dr. Chilton Si Supervising MD: Shirlee Latch MD, Freida Busman Indication 1: Atrial Fibrillation (ICD-427.31) Indication 2: Atrial Fibrillation Lab Used: [facility] of Care [place] Site: [address] INR POC 1.6 INR RANGE 2-3  Dietary changes: no    Health status changes: yes       Details: Back in atrial fibrillation.   Bleeding/hemorrhagic complications: no    Recent/future hospitalizations: no    Any changes in medication regimen? yes       Details: Resumed Coumadin 12/22 at 5mg  daily.    Recent/future dental: no  Any missed doses?: no       Is patient compliant with meds? yes       Allergies: No Known Drug Allergies  Anticoagulation Management History:      The patient is taking warfarin and comes in today for a routine follow up visit.  Negative risk factors for bleeding include an age less than 50 years old.  The bleeding index is 'low risk'.  Positive CHADS2 values include History of HTN.  Negative CHADS2 values include Age > 50 years old.  The start date was 11/28/2007.  His last INR was 5.6 RATIO.  Anticoagulation responsible provider: Shirlee Latch MD, [place].  INR POC: 1.6.  Cuvette Lot#: [id number].  Exp: 04/2011.    Anticoagulation Management Assessment/Plan:      The patient's current anticoagulation dose is Coumadin 5 mg tabs: take as directed per coumadin clinic.  The target INR is 2 - 3.  The next INR is due 03/24/2010.  Anticoagulation instructions were given to patient.  Results were reviewed/authorized by Cloyde Reams, RN, BSN.  He was notified by Cloyde Reams RN.         Prior Anticoagulation Instructions: INR 1.6  Stop coumadin. 4 weeks post procedure.  Current Anticoagulation Instructions: INR 1.6  Take 1.5 tablets today, then start taking 1 tablet daily except 1.5 tablets on Mondays.  Recheck in 1 week.

## 2010-04-20 NOTE — Medication Information (Signed)
Summary: rov/tp  Anticoagulant Therapy  Managed by: Geoffry Paradise, PharmD Referring MD: Andee Lineman PCP: [facility] [address], Dr. Chilton Si Supervising MD: Ladona Ridgel MD, Sharlot Gowda Indication 1: Atrial Fibrillation (ICD-427.31) Indication 2: Atrial Fibrillation Lab Used: [facility] Point of Care [place] Site: [address] INR POC 3.3 INR RANGE 2-3  Dietary changes: no    Health status changes: no    Bleeding/hemorrhagic complications: no    Recent/future hospitalizations: no    Any changes in medication regimen? no    Recent/future dental: no  Any missed doses?: no       Is patient compliant with meds? yes       Allergies: No Known Drug Allergies  Anticoagulation Management History:      The patient is taking warfarin and comes in today for a routine follow up visit.  Negative risk factors for bleeding include an age less than 28 years old.  The bleeding index is 'low risk'.  Positive CHADS2 values include History of HTN.  Negative CHADS2 values include Age > 43 years old.  The start date was 11/28/2007.  His last INR was 5.6 RATIO.  Anticoagulation responsible provider: Ladona Ridgel MD, Sharlot Gowda.  INR POC: 3.3.  Cuvette Lot#: [id number].  Exp: 03/2011.    Anticoagulation Management Assessment/Plan:      The patient's current anticoagulation dose is Coumadin 5 mg tabs: take as directed per coumadin clinic.  The target INR is 2 - 3.  The next INR is due 04/13/2010.  Anticoagulation instructions were given to patient.  Results were reviewed/authorized by Geoffry Paradise, PharmD.         Prior Anticoagulation Instructions: INR 2.5 Continue 5mg s everyday except 7.5mg s on Mondays. Recheck in 1 weeks.  Current Anticoagulation Instructions: INR:  3.3  Your INR is slightly higher than goal today.  This is your 3rd week of Coumadin prior to planned cardioversion.  Will rather you be on the higher end than lower at this moment.  Continue taking 5 mg daily except for 7.5 mg on Mondays.  Return in 1  week to recheck.

## 2010-04-20 NOTE — Medication Information (Signed)
Summary: ROV  Anticoagulant Therapy  Managed by: Eda Keys, PharmD Referring MD: deGent PCP: [facility] [address], Dr. Chilton Si Supervising MD: Myrtis Ser MD, Tinnie Gens Indication 1: Atrial Fibrillation (ICD-427.31) Indication 2: Atrial Fibrillation Lab Used: [facility] [place] Site: [address] INR POC 2.7 INR RANGE 2-3  Dietary changes: no    Health status changes: no    Bleeding/hemorrhagic complications: no    Recent/future hospitalizations: no    Any changes in medication regimen? no    Recent/future dental: yes     Details: patient pending DCCV (this is week 4)  Any missed doses?: no       Is patient compliant with meds? yes      Comments: Pending Cardioversion, this is week 4.   Current Medications (verified): 1)  Fish Oil Double Strength 1200 Mg Caps (Omega-3 Fatty Acids) .... Take 3 Tablet By Mouth Two Times A Day 2)  Nitroglycerin 0.4 Mg Subl (Nitroglycerin) .... One Tablet Under Tongue Every 5 Minutes As Needed For Chest Pain---May Repeat Times Three 3)  Lisinopril-Hydrochlorothiazide 20-25 Mg Tabs (Lisinopril-Hydrochlorothiazide) .... Take 1 Tablet By Mouth Once A Day 4)  Crestor 5 Mg Tabs (Rosuvastatin Calcium) .... Take One Tablet By Mouth Daily. 5)  Niacin 100 Mg Tabs (Niacin) .... Take 1 Tablet By Mouth Once A Day 6)  Multaq 400 Mg Tabs (Dronedarone Hcl) .... Take 1 Tablet By Mouth Two Times A Day 7)  Aspirin 81 Mg Tbec (Aspirin) .... Take 1 Tablet Daily. 8)  Centrum Cardio  Tabs (Multiple Vitamins-Minerals) .... Take 1 Tablet Twice Daily. 9)  Metoprolol Succinate 25 Mg Xr24h-Tab (Metoprolol Succinate) .... Take 1 Tablet By Mouth Once A Day 10)  Coumadin 5 Mg Tabs (Warfarin Sodium) .... Take As Directed Per Coumadin Clinic  Allergies (verified): No Known Drug Allergies  Anticoagulation Management History:      The patient is taking warfarin and comes in today for a routine follow up visit.  Negative risk factors for bleeding include an  age less than 67 years old.  The bleeding index is 'low risk'.  Positive CHADS2 values include History of HTN.  Negative CHADS2 values include Age > 21 years old.  The start date was 11/28/2007.  His last INR was 5.6 RATIO.  Anticoagulation responsible provider: Myrtis Ser MD, Tinnie Gens.  INR POC: 2.7.  Cuvette Lot#: [id number].  Exp: 03/2011.    Anticoagulation Management Assessment/Plan:      The patient's current anticoagulation dose is Coumadin 5 mg tabs: take as directed per coumadin clinic.  The target INR is 2 - 3.  The next INR is due 04/20/2010.  Anticoagulation instructions were given to patient.  Results were reviewed/authorized by Eda Keys, PharmD.  He was notified by Weston Brass PharmD.         Prior Anticoagulation Instructions: INR:  3.3  Your INR is slightly higher than goal today.  This is your 3rd week of Coumadin prior to planned cardioversion.  Will rather you be on the higher end than lower at this moment.  Continue taking 5 mg daily except for 7.5 mg on Mondays.  Return in 1 week to recheck.       Current Anticoagulation Instructions: INR 2.7  Continue taking 1.5 tablets on Monday and 1 tablet all other days.  Return to clinic.

## 2010-04-20 NOTE — Medication Information (Signed)
Summary: ROV/SP  Anticoagulant Therapy  Managed by: Bethena Midget, RN, BSN Referring MD: deGent PCP: [facility] [address] Dr. Chilton Si Supervising MD: Ladona Ridgel MD, Sharlot Gowda Indication 1: Atrial Fibrillation (ICD-427.31) Indication 2: Atrial Fibrillation Lab Used: [facility] [place] Site: [address] INR POC 2.5 INR RANGE 2-3  Dietary changes: no    Health status changes: no    Bleeding/hemorrhagic complications: no    Recent/future hospitalizations: no    Any changes in medication regimen? no    Recent/future dental: no  Any missed doses?: no       Is patient compliant with meds? yes      Comments: Pending DCCV  Allergies: No Known Drug Allergies  Anticoagulation Management History:      The patient is taking warfarin and comes in today for a routine follow up visit.  Negative risk factors for bleeding include an age less than 50 years old.  The bleeding index is 'low risk'.  Positive CHADS2 values include History of HTN.  Negative CHADS2 values include Age > 50 years old.  The start date was 11/28/2007.  His last INR was 5.6 RATIO.  Anticoagulation responsible provider: Ladona Ridgel MD, Sharlot Gowda.  INR POC: 2.5.  Cuvette Lot#: [id number].  Exp: 04/2011.    Anticoagulation Management Assessment/Plan:      The patient's current anticoagulation dose is Coumadin 5 mg tabs: take as directed per coumadin clinic.  The target INR is 2 - 3.  The next INR is due 04/06/2010.  Anticoagulation instructions were given to patient.  Results were reviewed/authorized by Bethena Midget, RN, BSN.  He was notified by Bethena Midget, RN, BSN.         Prior Anticoagulation Instructions: INR 2.5   Coumadin 5 mg tablets - Continue 1 tablet daily except 1.5 tablets on Mondays   Current Anticoagulation Instructions: INR 2.5 Continue 5mg s everyday except 7.5mg s on Mondays. Recheck in 1 weeks.

## 2010-04-21 ENCOUNTER — Encounter: Payer: Self-pay | Admitting: Cardiology

## 2010-04-21 ENCOUNTER — Encounter (INDEPENDENT_AMBULATORY_CARE_PROVIDER_SITE_OTHER): Payer: BC Managed Care – PPO

## 2010-04-21 DIAGNOSIS — I4891 Unspecified atrial fibrillation: Secondary | ICD-10-CM

## 2010-04-21 DIAGNOSIS — Z7901 Long term (current) use of anticoagulants: Secondary | ICD-10-CM

## 2010-04-24 ENCOUNTER — Encounter: Payer: Self-pay | Admitting: Cardiology

## 2010-04-25 ENCOUNTER — Ambulatory Visit (HOSPITAL_COMMUNITY)
Admission: RE | Admit: 2010-04-25 | Discharge: 2010-04-25 | Disposition: A | Discharge status: Home / Self Care | Payer: BC Managed Care – PPO | Source: Ambulatory Visit | Attending: Cardiology | Admitting: Cardiology

## 2010-04-25 ENCOUNTER — Encounter: Payer: Self-pay | Admitting: Cardiology

## 2010-04-25 ENCOUNTER — Ambulatory Visit (HOSPITAL_COMMUNITY): Admit: 2010-04-25 | Payer: Self-pay | Admitting: Cardiology

## 2010-04-25 DIAGNOSIS — I4891 Unspecified atrial fibrillation: Secondary | ICD-10-CM | POA: Insufficient documentation

## 2010-04-25 DIAGNOSIS — Z01810 Encounter for preprocedural cardiovascular examination: Secondary | ICD-10-CM | POA: Insufficient documentation

## 2010-04-25 DIAGNOSIS — I10 Essential (primary) hypertension: Secondary | ICD-10-CM | POA: Insufficient documentation

## 2010-04-25 DIAGNOSIS — Z8249 Family history of ischemic heart disease and other diseases of the circulatory system: Secondary | ICD-10-CM | POA: Insufficient documentation

## 2010-04-25 DIAGNOSIS — I252 Old myocardial infarction: Secondary | ICD-10-CM | POA: Insufficient documentation

## 2010-04-25 DIAGNOSIS — Z01812 Encounter for preprocedural laboratory examination: Secondary | ICD-10-CM | POA: Insufficient documentation

## 2010-04-25 DIAGNOSIS — I2510 Atherosclerotic heart disease of native coronary artery without angina pectoris: Secondary | ICD-10-CM | POA: Insufficient documentation

## 2010-04-25 DIAGNOSIS — E669 Obesity, unspecified: Secondary | ICD-10-CM | POA: Insufficient documentation

## 2010-04-25 DIAGNOSIS — E785 Hyperlipidemia, unspecified: Secondary | ICD-10-CM | POA: Insufficient documentation

## 2010-04-25 DIAGNOSIS — Z7901 Long term (current) use of anticoagulants: Secondary | ICD-10-CM | POA: Insufficient documentation

## 2010-04-25 LAB — APTT: aPTT: 36 seconds (ref 24–37)

## 2010-04-25 LAB — BASIC METABOLIC PANEL
BUN: 11 mg/dL (ref 6–23)
CO2: 28 mEq/L (ref 19–32)
Calcium: 9.6 mg/dL (ref 8.4–10.5)
Creatinine, Ser: 1.09 mg/dL (ref 0.4–1.5)
GFR calc Af Amer: >60 mL/min (ref >60)
Glucose, Bld: 110 mg/dL — ABNORMAL HIGH (ref 70–99)

## 2010-04-25 LAB — CBC
MCH: 30.2 pg (ref 26.0–34.0)
Platelets: 164 10*3/uL (ref 150–400)
RBC: 5.99 MIL/uL — ABNORMAL HIGH (ref 4.22–5.81)

## 2010-04-26 NOTE — Miscellaneous (Signed)
Summary: Orders Update - DCCV  Clinical Lists Changes  Orders: Added new Referral order of Cardioversion (Cardioversion) - Signed 

## 2010-04-26 NOTE — Progress Notes (Signed)
Summary: ready for DCCV  ---- Converted from flag ---- ---- 04/14/2010 10:13 AM, Lewayne Bunting, MD, [facility] wrote: yes no problem at all go ahead and schedule the patient in [place].  A forward a copy to [place].  Guy  ---- 04/13/2010 5:53 PM, Weston Brass PharmD wrote: Pt with 4 therapeutic INRs.  Ready for DCCV.  Would like to be scheduled in [place] if at all possible.  thanks. ------------------------------

## 2010-04-26 NOTE — Medication Information (Signed)
Summary: rov/eac pending dccv  Anticoagulant Therapy  Managed by: Cloyde Reams, RN, BSN Referring MD: deGent PCP: [facility] [address], Dr. Chilton Si Supervising MD: Jens Som MD, Arlys John Indication 1: Atrial Fibrillation (ICD-427.31) Indication 2: Atrial Fibrillation Lab Used: [facility] [place] Site: [address] INR POC 3.0 INR RANGE 2-3  Dietary changes: no    Health status changes: no    Bleeding/hemorrhagic complications: no    Recent/future hospitalizations: no    Any changes in medication regimen? no    Recent/future dental: no  Any missed doses?: no       Is patient compliant with meds? yes      Comments: DCCV scheduled for Tuesday 04/25/10.   Allergies: No Known Drug Allergies  Anticoagulation Management History:      The patient is taking warfarin and comes in today for a routine follow up visit.  Negative risk factors for bleeding include an age less than 63 years old.  The bleeding index is 'low risk'.  Positive CHADS2 values include History of HTN.  Negative CHADS2 values include Age > 40 years old.  The start date was 11/28/2007.  His last INR was 5.6 RATIO.  Anticoagulation responsible provider: Jens Som MD, Arlys John.  INR POC: 3.0.  Cuvette Lot#: [id number].  Exp: 03/2011.    Anticoagulation Management Assessment/Plan:      The patient's current anticoagulation dose is Coumadin 5 mg tabs: take as directed per coumadin clinic.  The target INR is 2 - 3.  The next INR is due 05/05/2010.  Anticoagulation instructions were given to patient.  Results were reviewed/authorized by Cloyde Reams, RN, BSN.  He was notified by Cloyde Reams RN.         Prior Anticoagulation Instructions: INR 2.7  Continue taking 1.5 tablets on Monday and 1 tablet all other days.  Return to clinic.   Current Anticoagulation Instructions: INR 3.0  Continue on same dosage 1 tablet daily except 1.5 tablets on Mondays.  Recheck in 1 week.

## 2010-05-03 NOTE — H&P (Signed)
NAME:  UTAH, Scott White NO.:  [id number]  MEDICAL RECORD NO.:  [id number]           PATIENT TYPE:  O  LOCATION:  [facility]                         FACILITY:  [facility]  PHYSICIAN:  Madolyn Frieze. Jens Som, MD, FACCDATE OF BIRTH:  1961-02-23  DATE OF ADMISSION:  04/25/2010 DATE OF DISCHARGE:  04/25/2010                             HISTORY & PHYSICAL   PRIMARY CARDIOLOGIST:  Learta Codding, MD, [facility]  PRIMARY CARE PROVIDERS:  [facility] and Dr. Chilton Si.  PATIENT PROFILE:  A 50 year old male with prior history of coronary disease status post prior MI and subsequently AFib recurrent late last year, now on Coumadin and presenting today for elective cardioversion.  PROBLEMS: 1. Coronary artery disease.     a.     Status post inferior myocardial infarction in May 2004 with      bare-metal stenting of the right coronary artery.     b.     Ejection fraction 60% by echocardiogram in December 1979. 2. Atrial fibrillation. 3. Hypertension. 4. Obesity. 5. Hyperlipidemia. 6. Significant family history of coronary disease on his mother's     side.  HISTORY OF PRESENT ILLNESS:  A 50 year old male with prior history of CAD and atrial fibrillation who has failed cardioversions in the past. She was subsequently placed on dronedarone and seen by Dr. Johney Frame for a second opinion regarding possibly pulmonary vein isolation.  At one point, Tikosyn was recommended but the patient declined.  He had previously been in sinus rhythm, but when seen in the office on March 09, 2010, he was noted to be back in AFib which was rate controlled. The patient reported symptoms of fatigue and decision was made to resume Coumadin with plan for elective cardioversion following 4 weeks of therapeutic INR.  The patient's INRs have now therapeutic for 5 weeks, and he presents today for elective cardioversion.  His only complaint is fatigue.  He does occasionally have PVCs which he notes are skipping  but otherwise has been stable without chest pain or dyspnea.  He denies PND, orthopnea, dizziness, syncope, or edema.  ALLERGIES:  No known drug allergies.  HOME MEDICATIONS: 1. Fish oil 1200 mg 3 tablets twice a day. 2. Nitroglycerin 0.4 mg sublingual p.r.n. chest pain. 3. Lisinopril HCT 20/25 mg daily. 4. Crestor 5 mg daily. 5. Niacin 100 mg daily. 6. Multaq 400 mg b.i.d. 7. Aspirin 81 mg daily. 8. Centrum cardio tablets b.i.d. 9. Toprol-XL 25 mg daily. 10.Coumadin as directed.  FAMILY HISTORY:  Mother is alive with history of coronary disease at an early age.  Most of her siblings and relatives also have coronary disease.  Father died at 54 with Alzheimer's.  He has an older brother and a younger brother, both are alive and well.  SOCIAL HISTORY:  The patient lives in [place] by himself.  He works as a Engineer, water for [employer].  He denies tobacco or drug use.  He drinks one glass of wine or beer daily.  He tries to remain active.  REVIEW OF SYSTEMS:  Notable for fatigue and occasional skipping of his heart beat which he identifies as  PVCs.  Otherwise, all systems reviewed and negative.  PHYSICAL EXAMINATION:  VITAL SIGNS:  He is afebrile, heart rate is 88, respirations 16, blood pressure is 128/86, pulse ox 98% on room air. GENERAL:  Pleasant white male in no acute distress.  Awake, alert, and oriented x3.  He has a normal affect. HEENT:  Normal.  Nares grossly intact and nonfocal. SKIN:  Warm and dry without lesions or masses. NECK:  Supple without bruits or JVD. LUNGS:  Respirations regular and unlabored.  Clear to auscultation. CARDIAC:  Irregularly irregular.  S1 and S2.  No S3, S4, murmurs. ABDOMEN:  Round, soft, nontender, nondistended.  Bowel sounds present x4. EXTREMITIES:  Warm, dry, and pink.  No clubbing, cyanosis, or edema. Dorsalis pedis and posterior tibial pulses 2+ and equal bilaterally.  ASSESSMENT AND PLAN: 1. Recurrent  atrial fibrillation.  The patient complains of fatigue     and has been maintained on dronedarone and Coumadin.  His INRs have     not been therapeutic for 5 weeks and his INR is 2.09 today.  He is     here for elective cardioversion with plan for discharge from short-     stay following. 2. Coronary artery disease.  No current complaints.  Continue home     medications.     Nicolasa Ducking, ANP   ______________________________ Madolyn Frieze. Jens Som, MD, [facility]    CB/MEDQ  D:  04/25/2010  T:  04/26/2010  Job:  [id number]  Electronically Signed by Nicolasa Ducking ANP on 05/01/2010 12:04:59 PM Electronically Signed by Olga Millers MD [facility] on 05/03/2010 07:20:16 PM

## 2010-05-04 NOTE — Letter (Signed)
Summary: Internal Correspondence/ FAXED CARDIOVERSION ORDER  Internal Correspondence/ FAXED CARDIOVERSION ORDER   Imported By: Dorise Hiss 04/26/2010 11:35:49  _____________________________________________________________________  External Attachment:    Type:   Image     Comment:   External Document

## 2010-05-05 ENCOUNTER — Encounter: Payer: Self-pay | Admitting: Cardiology

## 2010-05-05 ENCOUNTER — Encounter (INDEPENDENT_AMBULATORY_CARE_PROVIDER_SITE_OTHER): Payer: BC Managed Care – PPO

## 2010-05-05 DIAGNOSIS — Z7901 Long term (current) use of anticoagulants: Secondary | ICD-10-CM

## 2010-05-05 DIAGNOSIS — I4891 Unspecified atrial fibrillation: Secondary | ICD-10-CM

## 2010-05-10 NOTE — Medication Information (Signed)
Summary: rov/sp  Anticoagulant Therapy  Managed by: Weston Brass, PharmD Referring MD: Andee Lineman PCP: [facility] [address], Dr. Chilton Si Supervising MD: Daleen Squibb MD, Maisie Fus Indication 1: Atrial Fibrillation (ICD-427.31) Indication 2: Atrial Fibrillation Lab Used: [facility] [place] Site: [address] INR POC 2.9 INR RANGE 2-3  Dietary changes: no    Health status changes: no    Bleeding/hemorrhagic complications: no    Recent/future hospitalizations: no    Any changes in medication regimen? no    Recent/future dental: no  Any missed doses?: no       Is patient compliant with meds? yes       Allergies: No Known Drug Allergies  Anticoagulation Management History:      The patient is taking warfarin and comes in today for a routine follow up visit.  Negative risk factors for bleeding include an age less than 18 years old.  The bleeding index is 'low risk'.  Positive CHADS2 values include History of HTN.  Negative CHADS2 values include Age > 80 years old.  The start date was 11/28/2007.  His last INR was 5.6 RATIO.  Anticoagulation responsible provider: Daleen Squibb MD, Maisie Fus.  INR POC: 2.9.  Cuvette Lot#: [id number].  Exp: 03/2011.    Anticoagulation Management Assessment/Plan:      The patient's current anticoagulation dose is Coumadin 5 mg tabs: take as directed per coumadin clinic.  The target INR is 2 - 3.  The next INR is due 06/01/2010.  Anticoagulation instructions were given to patient.  Results were reviewed/authorized by Weston Brass, PharmD.  He was notified by Margot Chimes PharmD Candidate.         Prior Anticoagulation Instructions: INR 3.0  Continue on same dosage 1 tablet daily except 1.5 tablets on Mondays.  Recheck in 1 week.    Current Anticoagulation Instructions: INR 2.9  Continue current dose of 1 tablet daily except on Mondays when you take 1 and 1/2 tablets.  Recheck INR in 4 weeks.

## 2010-05-25 ENCOUNTER — Ambulatory Visit (INDEPENDENT_AMBULATORY_CARE_PROVIDER_SITE_OTHER): Payer: BC Managed Care – PPO | Admitting: Cardiology

## 2010-05-25 ENCOUNTER — Encounter: Payer: Self-pay | Admitting: Cardiology

## 2010-05-25 DIAGNOSIS — I4891 Unspecified atrial fibrillation: Secondary | ICD-10-CM

## 2010-05-31 ENCOUNTER — Encounter: Payer: Self-pay | Admitting: Cardiology

## 2010-05-31 DIAGNOSIS — Z7901 Long term (current) use of anticoagulants: Secondary | ICD-10-CM | POA: Insufficient documentation

## 2010-05-31 DIAGNOSIS — I4891 Unspecified atrial fibrillation: Secondary | ICD-10-CM

## 2010-06-05 ENCOUNTER — Encounter (INDEPENDENT_AMBULATORY_CARE_PROVIDER_SITE_OTHER): Payer: BC Managed Care – PPO

## 2010-06-05 ENCOUNTER — Encounter: Payer: Self-pay | Admitting: Internal Medicine

## 2010-06-05 DIAGNOSIS — I4891 Unspecified atrial fibrillation: Secondary | ICD-10-CM

## 2010-06-05 DIAGNOSIS — Z7901 Long term (current) use of anticoagulants: Secondary | ICD-10-CM

## 2010-06-06 NOTE — Assessment & Plan Note (Signed)
Summary: F1M-AGH/SRS   Visit Type:  Follow-up Referring Provider:  Dr Andee Lineman Primary Provider:  [facility] [address], Dr. Chilton Si   History of Present Illness: The patient underwent cardioversion last month.  The patient presents for follow-up.  He was last seen in December.  The patient was found to be in recurrent atrial fibrillation.  At that time the patient had not taken his dronedarone for several days. His ejection fraction is normal by echo.  He does have a history of coronary artery disease and status post inferior myocardial infarction the bare metal stent to the mid RCA in 2004. Unfortunately the patient is back in atrial fibrillation although the initial cardioversion was successful.  The patient has now failed antiarrhythmic therapy on several occasions.  He remains symptomatic with atrial fibrillation he feels tired and fatigued.  More importantly he still has palpitations off and on particularly the middle of the night when he feels his heart racing.  The patient stated he does not have the same energy level than when he is in normal sinus rhythm.  He also has PVCs on electrocardiogram today.   Preventive Screening-Counseling & Management  Alcohol-Tobacco     Smoking Status: never  Current Medications (verified): 1)  Fish Oil Double Strength 1200 Mg Caps (Omega-3 Fatty Acids) .... Take 3 Tablet By Mouth Two Times A Day 2)  Nitroglycerin 0.4 Mg Subl (Nitroglycerin) .... One Tablet Under Tongue Every 5 Minutes As Needed For Chest Pain---May Repeat Times Three 3)  Lisinopril-Hydrochlorothiazide 20-25 Mg Tabs (Lisinopril-Hydrochlorothiazide) .... Take 1 Tablet By Mouth Once A Day 4)  Crestor 5 Mg Tabs (Rosuvastatin Calcium) .... Take One Tablet By Mouth Daily. 5)  Niacin 100 Mg Tabs (Niacin) .... Take 1 Tablet By Mouth Once A Day 6)  Aspirin 81 Mg Tbec (Aspirin) .... Take 1 Tablet Daily. 7)  Centrum Cardio  Tabs (Multiple Vitamins-Minerals) .... Take 1 Tablet Twice Daily. 8)   Metoprolol Succinate 25 Mg Xr24h-Tab (Metoprolol Succinate) .... Take 1 Tablet By Mouth Once A Day 9)  Coumadin 5 Mg Tabs (Warfarin Sodium) .... Take As Directed Per Coumadin Clinic 10)  Cardizem 30 Mg Tabs (Diltiazem Hcl) .... Take One Tab Evey 8 Hours X 10 Days, Then As Needed For Palpitations 11)  Cardizem Cd 120 Mg Xr24h-Cap (Diltiazem Hcl Coated Beads) .... Take 1 Tablet By Mouth Once A Day  Allergies (verified): No Known Drug Allergies  Comments:  Nurse/Medical Assistant: The patient's medication list and allergies were reviewed with the patient and were updated in the Medication and Allergy Lists.  Past History:  Past Medical History: Last updated: 08/22/2009 Persistent atrial fibrillation CHADS2 is one CAD- Status post acute inferior wall myocardial infarction emergent bare metal stenting of the mid RCA, May 2004,  residual nonobstructive coronary artery disease Ejection fraction 60% by echocardiography 04/2007 Hypertension Obesity HL  Past Surgical History: Last updated: 08/22/2009 PCI RCA 2004  Family History: Last updated: 08/22/2009 maternal family members with CAD/ early CAD (some in their 30s)  Social History: Last updated: 08/22/2009 Pt lives in [place] alone.  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  Alcohol Use - 1 beer or glass of wine per day  Risk Factors: Smoking Status: never (05/25/2010)  Review of Systems       The patient complains of palpitations.  The patient denies fatigue, malaise, fever, weight gain/loss, vision loss, decreased hearing, hoarseness, chest pain, shortness of breath, prolonged cough, wheezing, sleep apnea, coughing  up blood, abdominal pain, blood in stool, nausea, vomiting, diarrhea, heartburn, incontinence, blood in urine, muscle weakness, joint pain, leg swelling, rash, skin lesions, headache, fainting, dizziness, depression, anxiety, enlarged lymph nodes, easy bruising or bleeding,  and environmental allergies.    Vital Signs:  Patient profile:   50 year old male Height:      71 inches Weight:      225 pounds BMI:     31.49 Pulse rate:   81 / minute BP sitting:   115 / 81  (left arm) Cuff size:   large  Vitals Entered By: Carlye Grippe (May 25, 2010 2:33 PM)  Nutrition Counseling: Patient's BMI is greater than 25 and therefore counseled on weight management options.  Physical Exam  Additional Exam:  General: Well-developed, well-nourished in no distress head: Normocephalic and atraumatic eyes PERRLA/EOMI intact, conjunctiva and lids normal nose: No deformity or lesions mouth normal dentition, normal posterior pharynx neck: Supple, no JVD.  No masses, thyromegaly or abnormal cervical nodes lungs: Normal breath sounds bilaterally without wheezing.  Normal percussion heart:irregular rate and rhythm with normal S1 and S2, no S3 or S4.  PMI is normal.  No pathological murmurs abdomen: Normal bowel sounds, abdomen is soft and nontender without masses, organomegaly or hernias noted.  No hepatosplenomegaly musculoskeletal: Back normal, normal gait muscle strength and tone normal pulsus: Pulse is normal in all 4 extremities Extremities: No peripheral pitting edema neurologic: Alert and oriented x 3 skin: Intact without lesions or rashes cervical nodes: No significant adenopathy psychologic: Normal affect    Impression & Recommendations:  Problem # 1:  ATRIAL FIBRILLATION (ICD-427.31) atrial fibrillation: Status post cardioversion last month.  The plan is to refer to patient the EP service for possible pulmonary vein .  The patient has declined dofetilide.  I do feel he is a reasonable candidate for pulmonary vein isolation.  In the interim we will stop dronedarone and switch him over to an initial short acting Cardizem and on long acting Cardizem.  He can also use p.r.n. diltiazem for tachycardia palpitations.  The patient will remain on Coumadin in the  interim. The following medications were removed from the medication list:    Multaq 400 Mg Tabs (Dronedarone hcl) .[name]... Take 1 tablet by mouth two times a day His updated medication list for this problem includes:    Aspirin 81 Mg Tbec (Aspirin) .[name]... Take 1 tablet daily.    Metoprolol Succinate 25 Mg Xr24h-tab (Metoprolol succinate) .[name]... Take 1 tablet by mouth once a day    Coumadin 5 Mg Tabs (Warfarin sodium) .[name]... Take as directed per coumadin clinic  Orders: EKG w/ Interpretation (93000)  Problem # 2:  CORONARY ATHEROSCLEROSIS NATIVE CORONARY ARTERY (ICD-414.01) Coronary artery disease: Stable no recurrent chest pain ejection fraction 65% His updated medication list for this problem includes:    Nitroglycerin 0.4 Mg Subl (Nitroglycerin) ..... One tablet under tongue every 5 minutes as needed for chest pain---may repeat times three    Lisinopril-hydrochlorothiazide 20-25 Mg Tabs (Lisinopril-hydrochlorothiazide) .[name]... Take 1 tablet by mouth once a day    Aspirin 81 Mg Tbec (Aspirin) .[name]... Take 1 tablet daily.    Metoprolol Succinate 25 Mg Xr24h-tab (Metoprolol succinate) .[name]... Take 1 tablet by mouth once a day    Coumadin 5 Mg Tabs (Warfarin sodium) .[name]... Take as directed per coumadin clinic    Cardizem 30 Mg Tabs (Diltiazem hcl) .[name]... Take one tab evey 8 hours x 10 days, then as needed for palpitations  Cardizem Cd 120 Mg Xr24h-cap (Diltiazem hcl coated beads) .[name]... Take 1 tablet by mouth once a day  Problem # 3:  COUMADIN THERAPY (ICD-V58.61) Coumadin anticoagulation.  Continue Coumadin in the interim  Patient Instructions: 1)  Referral to Dr. Johney Frame - Thursday, April 19 at 10:30 in [facility]. office 2)  Stop Multaq 3)  Cardizem 30mg  every 8 hours x 10 days, then change to Cardizem CD 120mg  daily 4)  Follow up in  3 months Prescriptions: CARDIZEM CD 120 MG XR24H-CAP (DILTIAZEM HCL COATED BEADS) Take 1 tablet by mouth once a day  #30 x 6   Entered by:   Hoover Brunette, LPN    Authorized by:   Lewayne Bunting, MD, [facility]   Signed by:   Hoover Brunette, LPN on 40/98/1191   Method used:   Electronically to        [id number] (retail)       [address]       [place], [place]  [address]       Ph: [phone] or [phone]       Fax: [phone]   RxID:   [id number] CARDIZEM 30 MG TABS (DILTIAZEM HCL) take one tab evey 8 hours x 10 days, then as needed for palpitations  #60 x 1   Entered by:   Hoover Brunette, LPN   Authorized by:   Lewayne Bunting, MD, [facility]   Signed by:   Hoover Brunette, LPN on 56/38/7564   Method used:   Electronically to        [facility] (retail)       [address]       [place], [place]  [address]       Ph: [phone] or [phone]       Fax: [phone]   RxID:   [id number]

## 2010-06-15 NOTE — Medication Information (Signed)
Summary: rov/tp  Anticoagulant Therapy  Managed by: Geoffry Paradise, PharmD Referring MD: Andee Lineman PCP: [facility] [address] Dr. Chilton Si Supervising MD: Tenny Craw MD, Gunnar Fusi Indication 1: Atrial Fibrillation (ICD-427.31) Indication 2: Atrial Fibrillation Lab Used: [facility] [place] Site: [address] INR POC 2.2 INR RANGE 2-3  Dietary changes: no    Health status changes: no    Bleeding/hemorrhagic complications: no    Recent/future hospitalizations: no    Any changes in medication regimen? yes       Details: Cardizem added two week ago   Recent/future dental: no  Any missed doses?: yes     Details: missed 1 dose last Friday (3/16)  Is patient compliant with meds? yes       Allergies: No Known Drug Allergies  Anticoagulation Management History:      The patient is taking warfarin and comes in today for a routine follow up visit.  Negative risk factors for bleeding include an age less than 48 years old.  The bleeding index is 'low risk'.  Positive CHADS2 values include History of HTN.  Negative CHADS2 values include Age > 37 years old.  The start date was 11/28/2007.  His last INR was 5.6 RATIO.  Anticoagulation responsible provider: Tenny Craw MD, Gunnar Fusi.  INR POC: 2.2.  Cuvette Lot#: [id number].  Exp: 05/2011.    Anticoagulation Management Assessment/Plan:      The patient's current anticoagulation dose is Coumadin 5 mg tabs: take as directed per coumadin clinic.  The target INR is 2 - 3.  The next INR is due 07/03/2010.  Anticoagulation instructions were given to patient.  Results were reviewed/authorized by Geoffry Paradise, PharmD.         Prior Anticoagulation Instructions: INR 2.9  Continue current dose of 1 tablet daily except on Mondays when you take 1 and 1/2 tablets.  Recheck INR in 4 weeks.    Current Anticoagulation Instructions: INR:  2.2 (goal 2-3)  Your INR is at goal today.  Continue taking 1 tablet everyday EXCEPT 1.5 tablet on Mondays.  Return in 4  weeks for another INR check.

## 2010-06-23 LAB — CBC
Hemoglobin: 15.6 g/dL (ref 13.0–17.0)
MCHC: 33.8 g/dL (ref 30.0–36.0)
MCV: 90.6 fL (ref 78.0–100.0)
RBC: 5.08 MIL/uL (ref 4.22–5.81)

## 2010-06-23 LAB — BASIC METABOLIC PANEL
CO2: 26 mEq/L (ref 19–32)
Chloride: 103 mEq/L (ref 96–112)
GFR calc Af Amer: >60 mL/min (ref >60)
Potassium: 3.9 mEq/L (ref 3.5–5.1)
Sodium: 140 mEq/L (ref 135–145)

## 2010-07-03 ENCOUNTER — Encounter: Payer: Self-pay | Admitting: Cardiology

## 2010-07-03 ENCOUNTER — Ambulatory Visit (INDEPENDENT_AMBULATORY_CARE_PROVIDER_SITE_OTHER): Payer: BC Managed Care – PPO | Admitting: *Deleted

## 2010-07-03 DIAGNOSIS — I4891 Unspecified atrial fibrillation: Secondary | ICD-10-CM

## 2010-07-03 DIAGNOSIS — Z7901 Long term (current) use of anticoagulants: Secondary | ICD-10-CM

## 2010-07-03 LAB — POCT INR: INR: 2.7

## 2010-07-03 MED ORDER — WARFARIN SODIUM 5 MG PO TABS: 5.0000 mg | ORAL_TABLET | ORAL | Status: DC

## 2010-07-05 ENCOUNTER — Encounter: Payer: Self-pay | Admitting: Internal Medicine

## 2010-07-06 ENCOUNTER — Institutional Professional Consult (permissible substitution): Payer: BC Managed Care – PPO | Admitting: Internal Medicine

## 2010-07-10 ENCOUNTER — Ambulatory Visit (INDEPENDENT_AMBULATORY_CARE_PROVIDER_SITE_OTHER): Payer: BC Managed Care – PPO | Admitting: Internal Medicine

## 2010-07-10 ENCOUNTER — Encounter: Payer: Self-pay | Admitting: Internal Medicine

## 2010-07-10 ENCOUNTER — Encounter: Payer: Self-pay | Admitting: *Deleted

## 2010-07-10 DIAGNOSIS — I2510 Atherosclerotic heart disease of native coronary artery without angina pectoris: Secondary | ICD-10-CM

## 2010-07-10 DIAGNOSIS — R0609 Other forms of dyspnea: Secondary | ICD-10-CM

## 2010-07-10 DIAGNOSIS — R0683 Snoring: Secondary | ICD-10-CM

## 2010-07-10 DIAGNOSIS — I10 Essential (primary) hypertension: Secondary | ICD-10-CM

## 2010-07-10 DIAGNOSIS — I4891 Unspecified atrial fibrillation: Secondary | ICD-10-CM

## 2010-07-10 DIAGNOSIS — R0989 Other specified symptoms and signs involving the circulatory and respiratory systems: Secondary | ICD-10-CM

## 2010-07-10 NOTE — Patient Instructions (Addendum)
Your physician has recommended that you have an ablation. Catheter ablation is a medical procedure used to treat some cardiac arrhythmias (irregular heartbeats). During catheter ablation, a long, thin, flexible tube is put into a blood vessel in your groin (upper thigh), or neck. This tube is called an ablation catheter. It is then guided to your heart through the blood vessel. Radio frequency waves destroy small areas of heart tissue where abnormal heartbeats may cause an arrhythmia to start. Please see the instruction sheet given to you today. afib  Your physician has recommended that you have a sleep study. This test records several body functions during sleep, including: brain activity, eye movement, oxygen and carbon dioxide blood levels, heart rate and rhythm, breathing rate and rhythm, the flow of air through your mouth and nose, snoring, body muscle movements, and chest and belly movement. afib snoring  TEE will be on 07/31/10 with Dr Tenny Craw  Check in at short stay center  At 9:00am  Nothing to eat or drink after midnight

## 2010-07-10 NOTE — Assessment & Plan Note (Signed)
The patient has symptomatic persistent atrial fibrillation despite medical therapy with multaq, metoprolol, and cardizem.  He is appropriately anticoagulated with coumadin.  Therapeutic strategies for afib including medicine and ablation were discussed in detail with the patient today. Risk, benefits, and alternatives to EP study and radiofrequency ablation for afib were also discussed in detail today. These risks include but are not limited to stroke, bleeding, vascular damage, tamponade, perforation, damage to the esophagus, lungs, and other structures, pulmonary vein stenosis, worsening renal function, and death. The patient understands these risk and wishes to proceed.  We will therefore proceed with catheter ablation at the next available time.  He snores and feels fatigued in the daytime.  I will therefore refer him for sleep study to evaluate for sleep apnea prior to ablation.

## 2010-07-10 NOTE — Assessment & Plan Note (Signed)
Stable without ischemic symptoms No change required today  

## 2010-07-10 NOTE — Progress Notes (Signed)
The patient presents today for further electrophysiology followup of persistent atrial fibrillation. He had an acute inferior wall MI 2004 for which he had BMS to RCA.  He did well thereafter.  He reports initially being diagnosed with afib late 2009 after presenting for annual exam with Dr Andee Lineman.  He was cardioverted 11/09 and treated with coumadin.  He did well until 4-5 months later, when he developed sypmtoms of fatigue and decreased exercise tolerance.  He was again cardioverted 9/10.  He reports feeling "much better" after cardioversion.  He was not placed on an antiarrhythmic medicine.  He did well until 4/11 when he again developed fatigue and decreased exercise tolerance.  He was again found to have afib.  He was restarted on coumadin and subsequently on multaq 400mg  two times a day.  He was cardioverted 6/11 and maintained sinus rhythm for 4-5 months.  He reports significant improvement in fatigue and exercise tolerance when in sinus rhythm.  Unfortunately, upon return to Dr Andee Lineman 12/11, he had returned to atrial fibrillation.  He was again cardioverted 2/12 but returned to afib after several weeks.  He now presents for further EP evaluation.   Past Medical History  Diagnosis Date  . Atrial fibrillation     Persistent, failed medical therapy with multaq  . CAD (coronary artery disease)     Status post acute inferior wall myocardial infarction emergent bare metal stenting of the mid RCA, May 2004,  residual nonobstructive coronary artery disease Ejection fraction 60% by echocardiography 04/2007  . HTN (hypertension)   . Hyperlipidemia   . Snoring    Past Surgical History  Procedure Date  . Cardiac catheterization 2004     Successful PTCA and stent placement in the mid right   coronary artery with an extra 99% narrowing to 0% with placement of a 3.5 x  20 mm Express 2 stent with improvement of TIMI grade 1 flow to TIMI grade 3.    Current Outpatient Prescriptions  Medication Sig Dispense  Refill  . aspirin 81 MG tablet Take 81 mg by mouth daily.        [name] diltiazem (CARDIZEM) 120 MG tablet Take 120 mg by mouth daily.       [name] lisinopril-hydrochlorothiazide (PRINZIDE,ZESTORETIC) 20-25 MG per tablet Take 1 tablet by mouth daily.        . metoprolol succinate (TOPROL-XL) 25 MG 24 hr tablet Take 25 mg by mouth daily.        . Multiple Vitamins-Minerals (CENTRUM PO) 1 tab po qd       . niacin 100 MG tablet Take 100 mg by mouth daily with breakfast.        . nitroGLYCERIN (NITROSTAT) 0.4 MG SL tablet Place 0.4 mg under the tongue every 5 (five) minutes as needed.        . Omega-3 Fatty Acids (FISH OIL) 1200 MG CAPS 1 tab po qd       . warfarin (COUMADIN) 5 MG tablet Take 1 tablet (5 mg total) by mouth as directed.  40 tablet  3  . rosuvastatin (CRESTOR) 5 MG tablet Take 5 mg by mouth daily.          No Known Allergies  History   Social History  . Marital Status: Single    Spouse Name: N/A    Number of Children: N/A  . Years of Education: N/A   Occupational History  . Not on file.   Social History Main Topics  . Smoking status: Never  Smoker   . Smokeless tobacco: Not on file  . Alcohol Use: Yes     1 drink per day  . Drug Use: No  . Sexually Active: Not on file   Other Topics Concern  . Not on file   Social History Narrative   Pt lives in [place] alone.  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 Coronary artery disease      ROS-  All systems are reviewed and are negative except as outlined in the HPI above  Physical Exam: Filed Vitals:   07/10/10 1509  BP: 136/82  Pulse: 89  Height: 5\' 11"  (1.803 m)  Weight: 229 lb (103.874 kg)    GEN- The patient is well appearing, alert and oriented x 3 today.   Head- normocephalic, atraumatic Eyes-  Sclera clear, conjunctiva pink Ears- hearing intact Oropharynx- clear Neck- supple, no JVP Lymph- no cervical lymphadenopathy Lungs- Clear  to ausculation bilaterally, normal work of breathing Heart- irregular rate and rhythm, no murmurs, rubs or gallops, PMI not laterally displaced GI- soft, NT, ND, + BS Extremities- no clubbing, cyanosis, or edema MS- no significant deformity or atrophy Skin- no rash or lesion Psych- euthymic mood, full affect Neuro- strength and sensation are intact  Echo 2009 reveals preserved EF with normal LA size. TEE 6/11 reveals moderate biatrial enlargement with mild MR EKG 05/25/10 reveals afib with occasional PVCs, V rate 80 bpm  Assessment and Plan:

## 2010-07-10 NOTE — Assessment & Plan Note (Signed)
Stable No change required today  

## 2010-07-25 ENCOUNTER — Other Ambulatory Visit (INDEPENDENT_AMBULATORY_CARE_PROVIDER_SITE_OTHER): Payer: BC Managed Care – PPO | Admitting: *Deleted

## 2010-07-25 DIAGNOSIS — I4891 Unspecified atrial fibrillation: Secondary | ICD-10-CM

## 2010-07-26 LAB — CBC WITH DIFFERENTIAL/PLATELET
Basophils Relative: 2.5 % (ref 0.0–3.0)
Eosinophils Absolute: 0.1 10*3/uL (ref 0.0–0.7)
Eosinophils Relative: 1.8 % (ref 0.0–5.0)
Hemoglobin: 15.8 g/dL (ref 13.0–17.0)
Lymphocytes Relative: 34.3 % (ref 12.0–46.0)
MCHC: 34.5 g/dL (ref 30.0–36.0)
MCV: 89.1 fl (ref 78.0–100.0)
Monocytes Absolute: 0.8 10*3/uL (ref 0.1–1.0)
Neutro Abs: 3.6 10*3/uL (ref 1.4–7.7)
RBC: 5.14 Mil/uL (ref 4.22–5.81)

## 2010-07-26 LAB — PROTIME-INR: Prothrombin Time: 39.5 s — ABNORMAL HIGH (ref 10.2–12.4)

## 2010-07-26 LAB — BASIC METABOLIC PANEL
BUN: 18 mg/dL (ref 6–23)
Chloride: 98 mEq/L (ref 96–112)
Creatinine, Ser: 1.2 mg/dL (ref 0.4–1.5)
Glucose, Bld: 102 mg/dL — ABNORMAL HIGH (ref 70–99)

## 2010-07-31 ENCOUNTER — Encounter: Payer: BC Managed Care – PPO | Admitting: *Deleted

## 2010-07-31 ENCOUNTER — Ambulatory Visit (HOSPITAL_COMMUNITY)
Admission: RE | Admit: 2010-07-31 | Discharge: 2010-07-31 | Disposition: A | Discharge status: Home / Self Care | Payer: BC Managed Care – PPO | Source: Ambulatory Visit | Attending: Internal Medicine | Admitting: Internal Medicine

## 2010-07-31 ENCOUNTER — Ambulatory Visit (INDEPENDENT_AMBULATORY_CARE_PROVIDER_SITE_OTHER): Payer: BC Managed Care – PPO | Admitting: *Deleted

## 2010-07-31 DIAGNOSIS — I4891 Unspecified atrial fibrillation: Secondary | ICD-10-CM

## 2010-07-31 DIAGNOSIS — Z7901 Long term (current) use of anticoagulants: Secondary | ICD-10-CM

## 2010-08-01 ENCOUNTER — Ambulatory Visit (HOSPITAL_COMMUNITY)
Admission: RE | Admit: 2010-08-01 | Discharge: 2010-08-01 | Disposition: A | Discharge status: Home / Self Care | Payer: BC Managed Care – PPO | Source: Ambulatory Visit | Attending: Internal Medicine | Admitting: Internal Medicine

## 2010-08-01 DIAGNOSIS — I10 Essential (primary) hypertension: Secondary | ICD-10-CM | POA: Insufficient documentation

## 2010-08-01 DIAGNOSIS — I2510 Atherosclerotic heart disease of native coronary artery without angina pectoris: Secondary | ICD-10-CM | POA: Insufficient documentation

## 2010-08-01 DIAGNOSIS — I4891 Unspecified atrial fibrillation: Secondary | ICD-10-CM | POA: Insufficient documentation

## 2010-08-01 NOTE — Assessment & Plan Note (Signed)
[facility]                          [facility]   Abrian, [place] GIULIAN GOLDRING                    MRN:          [id number]  DATE:11/06/2006                            DOB:          12-24-1960    HISTORY OF PRESENT ILLNESS:  The patient is a 50 year old male with a  history of inferior wall myocardial infarction in 2004.  The patient has  done remarkably well.  He has no recurrence of jaw or chest pain.  He  has good exercise tolerance.  He has no shortness of breath, orthopnea,  PND or palpitations.  EKG in the office demonstrates normal sinus  rhythm, small inferior Q waves.  He has an ejection fraction of 40%  established by catheterization 2004.  Followup stress study in 2005  showed no evidence of ischemia.  The patient did not have blood work  done recently, but does report that he has some muscle tightness and  weakness on Lipitor.   MEDICATIONS:  1. Metoprolol 25 mg p.o. b.i.d.  2. Altace 10 mg p.o. daily.  3. Lipitor 40 mg p.o. daily.  4. Aspirin 81 mg p.o. daily.  5. Fish oil daily.  6. Multivitamin.  7. Plavix 75 mg p.o. daily.   PHYSICAL EXAMINATION:  VITAL SIGNS:  Blood pressure 135/96, heart rate  83.  GENERAL:  A well-nourished white male in no apparent distress.  NECK:  Normal carotid upstroke, no bruits.  LUNGS:  Clear breath sounds bilaterally.  HEART:  Regular rate and rhythm, normal S1, S2, no murmurs, rubs or  gallops.  ABDOMEN:  Soft, nontender, no rebound or guarding, good bowel sounds.  EXTREMITIES:  No cyanosis, clubbing or edema.   PROBLEM LIST:  1. Coronary artery disease.      a.     Status post prior inferior wall myocardial infarction May       2004.      b.     Residual nonobstructive coronary artery disease.      c.     Ejection fraction 40% by catheterization.  2. Dyslipidemia.  3. Hypertension  .   PLAN:  1. The patient will have blood work done at the urgent care center in      six weeks.  2. We cut his Lipitor to 20 mg p.o. daily due to side effects.  We      will _increase  fish oil to 1000 mg b.i.d.  3. The patient's blood pressure is elevated, and I have asked him to      monitor it very closely.      He did, however, not take any of his medications today.  4. The patient will follow up with [place] in one year.     Learta Codding, MD,FACC  Electronically Signed    GED/MedQ  DD: 11/06/2006  DT: 11/07/2006  Job #: [id number]   cc:   Jonita Albee, M.D.

## 2010-08-01 NOTE — Assessment & Plan Note (Signed)
[facility]                          [facility]   Scott White, Scott White                    MRN:          [id number]  DATE:08/18/2008                            DOB:          [birth year]    REFERRING PHYSICIAN:  [facility]   HISTORY OF PRESENT ILLNESS:  The patient is a 50 year old male with a  history of paroxysmal atrial fibrillation with rapid response.  He also  has single-vessel coronary artery disease requiring bare-metal stent of  the RCA in May 2004.  The patient underwent cardioversion in November by  Dr. Jens Som.  Initially, he maintained normal sinus rhythm.  The  patient stated, however, 2 months ago thought having a little bit more  fatigue and felt skipped beats in his heart.  When he brought a blood  pressure cuff and again noted that his heart rate was irregular.  Today  in the office in deed presence of atrial fibrillation confirmed by EKG.  The patient denies, however, any significant decrease in exercise  tolerance, shortness of breath, orthopnea, PND, or chest pain for that  matter.   MEDICATIONS:  1. Aspirin 81 mg p.o. daily.  2. Fish oil 2000 mg p.o. b.i.d.  3. Multivitamin b.i.d.  4. Lipitor 20 mg p.o. daily.  5. Ramipril 10 mg p.o. daily.  6. Lisinopril and hydrochlorothiazide 20/25 mg p.o. daily.   PHYSICAL EXAMINATION:  VITAL SIGNS:  Blood pressure 129/85, heart rate  is 72, weight is 221 pounds.  NECK:  Normal carotid upstroke and no carotid bruits.  No thyromegaly.  Non-nodular thyroid.  LUNGS:  Clear breath sounds bilaterally.  HEART:  Irregular rate and rhythm with normal S1 and S2.  No murmur,  rubs, or gallops.  ABDOMEN:  Soft, nontender.  EXTREMITIES:  No cyanosis, clubbing, or edema.   PROBLEM LIST:  1. Recurrent atrial fibrillation.  2. Coronary artery disease, status post stenting of the right coronary      artery.  3. Dyslipidemia.   PLAN:  1. The patient is back in  atrial fibrillation.  We discussed at length      rate control versus rhythm control strategy.  The patient has opted      for rhythm control.  He is going to check with his insurance      company first whether they will reimburse the hospitalization for 3      days to initiate Tikosyn versus outpatient Dronedarone.  2. As soon as the patient calls [place] back, we start on Coumadin in      preparation for an elective cardioversion on antiarrhythmic drug      therapy.     Learta Codding, MD,FACC  Electronically Signed    GED/MedQ  DD: 08/18/2008  DT: 08/19/2008  Job #: [id number]   cc:   [facility]

## 2010-08-01 NOTE — Assessment & Plan Note (Signed)
[facility]                          [facility] NOTE   Scott White, Scott White                    MRN:          [id number]  DATE:11/28/2007                            DOB:          1960/11/29    PRIMARY CARDIOLOGIST:  Scott Codding, MD, [facility]   REASON FOR VISIT:  Annual followup.   Scott White reports no interim development of signs/symptoms suggestive  of unstable angina pectoris, since last seen in our clinic a little over  a year ago.  He has, however, noted occasional irregular heartbeat,  but with no associated tachy palpitations.  He has no prior history of  documented dysrhythmia.   His last ischemic workup consisted of a routine treadmill test in  September 2008, which was reviewed by Dr. Andee White, and which was negative  for ischemia.  He has not had a repeat cardiac catheterization since he  presented with acute inferior MI in 2004, at which time he was treated  with bare metal stenting of the right coronary artery.   Electrocardiogram in our office today reveals atrial fibrillation at  approximately 90 bpm; normal axis; single PVCs; and no definite ischemic  changes, with Q waves in leads III and aVF.   CURRENT MEDICATIONS:  1. Aspirin 81 daily.  2. Lipitor 40 daily.  3. Altace 10 daily.  4. Metoprolol 25 b.i.d.  5. Fish oil 2000 b.i.d.   PHYSICAL EXAMINATION:  VITAL SIGNS:  Blood pressure 130/85, pulse 71,  regular, weight 227.  GENERAL:  A 50 year old male, moderately obese, sitting upright, in no  distress.  HEENT:  Normocephalic and atraumatic.  NECK:  Palpable bilateral pulses without bruits; no JVD.  LUNGS:  Clear to auscultation.  HEART:  Regular rate and rhythm.  No significant murmurs.  No rubs.  ABDOMEN:  Protuberant, nontender, intact bowel sounds.  EXTREMITIES:  Palpable distal pulses without edema.  NEURO:  No focal deficit.   IMPRESSION:  1. Paroxysmal atrial fibrillation with CVR.      a.     Unknown  duration.      b.     No significant associated symptoms.      c.     CHAD2 score: 1.  2. Single-vessel CAD.      a.     Status post acute inferior MI/emergent bare metal stenting       of mid RCA, May 2004.      b.     Residual nonobstructive CAD.      c.     Ejection fraction 40%.  3. Dyslipidemia.  4. Hypertension.   PLAN:  1. Initiate Coumadin anticoagulation with plans to proceed with DC      cardioversion in approximately 4 weeks.  If this is successful,      then the patient is to remain on Coumadin for an additional 4      weeks.  This can then be subsequently discontinued and he can      remain on aspirin indefinitely, given his low risk of stroke.  2. Baseline 2-D echo for reassessment of left ventricular function.  3.  TSH level.  Request most recent lipid profile from the patient's      primary care office.  4. The patient is advised to diminish his caffeinated beverage intake.  5. Schedule return clinic followup with myself and Dr. Andee White in      approximately 2 months.      Scott Serpe, PA-C  Electronically Signed      Scott Codding, MD,FACC  Electronically Signed   GS/MedQ  DD: 11/28/2007  DT: 11/29/2007  Job #: [id number]   cc:   Jonita Albee, M.D.

## 2010-08-02 ENCOUNTER — Telehealth: Payer: Self-pay | Admitting: Internal Medicine

## 2010-08-02 NOTE — Telephone Encounter (Signed)
Pt calling to see if ablation has been rs yet? Pt aware kelly out today, will call tomorrow

## 2010-08-03 NOTE — Telephone Encounter (Signed)
Spoke with pt and it has been moved to 08/15/10  Labs on 08/08/10 at 4:30

## 2010-08-04 NOTE — Cardiovascular Report (Signed)
NAME:  Scott White, Scott White NO.:  [id number]   MEDICAL RECORD NO.:  [id number]                   PATIENT TYPE:  INP   LOCATION:  1827                                 FACILITY:  [facility]   PHYSICIAN:  Veneda Melter, M.D.                   DATE OF BIRTH:  [birth year]   DATE OF PROCEDURE:  07/24/2002  DATE OF DISCHARGE:                              CARDIAC CATHETERIZATION   PROCEDURES PERFORMED:  1. Left heart catheterization.  2. Left ventriculogram.  3. Selective coronary angiography.  4. Percutaneous transluminal coronary angioplasty and stent placement in the     mid right coronary artery.  5. Perclose right femoral artery.   DIAGNOSES:  1. Coronary atherosclerotic disease.  2. Mild left ventricular systolic dysfunction.  3. Acute inferior wall myocardial infarction.   HISTORY OF PRESENT ILLNESS:  The patient is a 50 year old gentleman with a  strong family history of coronary disease who presents with stuttering chest  discomfort. The patient had mild discomfort the day  prior to  admission  that resolved. On the morning of presentation approximately 5:30 a.m. he had  some recurrence of the chest pain. He presented to [facility] where  he was found to have ST elevation in the inferior leads and was referred for  urgent assessment at [facility].   DESCRIPTION OF PROCEDURE:  Informed consent was obtained. The patient was  brought  to the catheterization laboratory. A 6 French sheath was placed in  the right femoral artery using modified Seldinger technique. A 6 French JL-4  and JR-4 catheter was then used  to engage the left and right coronary  arteries. Selective angiography was performed in various projections using  manual injection of contrast. A 6 French pigtail catheter was advanced to  the left ventricle and a left ventriculogram was performed using power  injection of contrast.   FINDINGS:  Initial findings are as follows:  1.  The left main trunk is a medium caliber vessel with a proximal taper of     20%.  2. The _________ is a medium caliber vessel that provides 2 small diagonal     branches in the midsection. The LAD has mild irregularities of 20% in the     midsection. The 1st diagonal branch has proximal narrowing of 20% to 30%.  3. Left circumflex artery. This is a medium caliber vessel that provides 2     marginal branches in the midsection. The proximal circumflex has moderate     irregular plaque of 40% to 50%. The distal branch vessels have mild     disease at 20% to 30%.  4. The right coronary artery is a dominant single large caliber vessel. It     provides the posterior descending artery and posterior ventricular branch     terminal segment. The right coronary artery has a severe narrowing of 99%  in the midsection with an ulcerated plaque. There is TIMI 1 flow     distally.  5. Left ventricle. Mildly dilated end systolic dimension. Overall left     ventricular function is mildly impaired. Ejection fraction approximately     40%. There is akinesis of the inferior wall. No mitral regurgitation is     noted. Left ventricular pressure is 130/10. Aortic is 130/90. LVEDP is     18.   With these finding it was elected to proceed with percutaneous intervention  of the right coronary artery. The patient was given heparin and Integrilin  at a weighted dose base to maintain ACT greater than 225 seconds. He was  also given 600 mg of Plavix. He had been pretreated with aspirin. Metoprolol  was given during the case for control of blood pressure and heart rate.   A 6 [place] JR-4 guide catheter was used  to engage the right coronary artery  and a 0.014 support wire was advanced into the distal RCA. A 2.5 x 50 mm  Cross Sail balloon was introduced and used to predilate the lesion at 6  atmospheres for 30 seconds. Repeat angiography showed significant  improvement in vessel diameter.   A 3.5 x 20 mm Express  2 stent was then introduced and carefully positioned  in the mid RCA and deployed at 10 atmospheres for 60 seconds. Inflation and  repeat angiography showed mild residual waist in the proximal segment of 20%  and a 3.5 x 12 mm Quantum Maverick balloon was then introduced and used to  post dilate the stent. Three inflations were performed within the distal,  mid and proximal segments of the stent at 12, 14 and 16 atmospheres  respectively x30 seconds each.   Angiography was then performed after the administration of intracoronary  nitroglycerin as well as IC verapamil showing no residual stenosis, no  distal vessel damage and improvement of flow to TIMI grade 3. Final  angiography was performed in various projections and the guide catheter was  then removed.   A Perclose suture closure device was deployed to the right femoral artery  until adequate hemostasis was achieved. The patient tolerated the procedure  well and was transferred to the CCU in stable condition.   FINAL RESULTS:  Successful PTCA and stent placement in the mid right  coronary artery with an extra 99% narrowing to 0% with placement of a 3.5 x  20 mm Express 2 stent with improvement of TIMI grade 1 flow to TIMI grade 3.                                               Veneda Melter, M.D.    NG/MEDQ  D:  07/24/2002  T:  07/27/2002  Job:  [id number]   cc:   [facility]   Learta Codding, M.D.  [address]  [address]  [place]  [place] [address]
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
## 2010-08-04 NOTE — Discharge Summary (Signed)
NAME:  Scott White, Scott White NO.:  [id number]   MEDICAL RECORD NO.:  [id number]                   PATIENT TYPE:  INP   LOCATION:  3737                                 FACILITY:  [facility]   PHYSICIAN:  Learta Codding, M.D.                 DATE OF BIRTH:  12/09/1960   DATE OF ADMISSION:  07/24/2002  DATE OF DISCHARGE:  07/27/2002                           DISCHARGE SUMMARY - REFERRING   BRIEF HISTORY:  This is a 50 year old male with no previous history of  coronary artery disease who had sudden onset of substernal chest pain at  approximately 1:30 the day prior to admission.  The patient went to the  Urgent Care Center on Jul 24, 2002.  He was found to have an abnormal EKG.  He was sent to [facility] where he was admitted.   PAST MEDICAL HISTORY:  Significant for mild obesity with a body mass index  of approximately 30.  He has not had regular medical follow-up but  apparently has been healthy for the most part.   ALLERGIES:  NO KNOWN DRUG ALLERGIES.   MEDICATIONS PRIOR TO ADMISSION:  Occasional aspirin.   SOCIAL HISTORY:  The patient lives alone in [place].  He works as a  Comptroller.  He does not use alcohol or tobacco.   FAMILY HISTORY:  The patient's mother is alive at age 42.  She has a history  of coronary artery disease that started in her 55s.  His father is age 66,  is living and has no known coronary artery disease.   HOSPITAL COURSE:  As noted, this patient was admitted to [facility]  through the emergency room after being seen at the Urgent Care Center with  an acute inferior wall MI.  The patient was taken to the catheterization  laboratory where he underwent PTCA stenting of a mid RCA lesion that was 90%  occluded.  This was reduced to 0%.  The patient had mild LV dysfunction.  The left main coronary artery had a proximal taper of approximately 20%.  The LAD had a 20% mid lesion.  The left circumflex had a 40-50%  AV lesion.  The ejection fraction was estimated at 40%.  It was recommended that the  patient stay on Plavix for at least a month.  The procedure was performed by  Dr. Chales Abrahams.   The remainder of the patient's hospital course was fairly unremarkable.  His  enzymes were elevated as would be expected.  They peaked on Jul 24, 2002 with  a troponin of 10.93.  A CK was 1501, MB was 175, index was 11.7.   The patient was also noted to have elevated glucose levels and a hemoglobin  A1c was elevated at 7.2.   The patient had a lipid profile performed that revealed cholesterol 200,  triglycerides 496, HDL 34, LDL was not calculated.  The patient was  placed  on a Statin.   The patient was discharged in improved condition on Jul 27, 2002.   LABORATORY DATA:  Please see results of labs as noted above, including lipid  profile and cardiac enzymes.  Stool for blood was negative.  A CBC on May 8  revealed hemoglobin 15, hematocrit 44, WBCs 9.4 thousand, platelets 184,000.  A chemistry profile on May 8 revealed a BUN of 8, creatinine 1.0, potassium  4.4, glucose was 153.  Previous glucose level was 334.  An AST was 65.  Hemoglobin A1c was 7.2 as noted.  A TSH was 2.829.   DISCHARGE MEDICATIONS:  1. Enteric coated aspirin 325 mg daily.  2. Lipitor 40 mg each evening.  3. Altace 10 mg daily.  4. Plavix 75 mg daily for six months.  5. Lopressor 50 mg b.i.d.  6. Nitroglycerin p.r.n. chest pain.  The patient was given instructions in     the use of nitroglycerin.  7. Tylenol as needed for pain.   The patient was told to avoid any strenuous activity or driving until  cleared by his physician.  He was not to lift more than 10 pounds for two  weeks.  He was to be on a low salt, low fat, no sweets diet.  He was to call  the office if he had any increased pain, swelling, or bleeding from his  groin.  He was to follow up with cardiac rehab as instructed.  He was to see  Dr. Margarita Mail physician assistant on  May 26 at 12 noon.  He was to follow up  with his primary care physician in 1-2 weeks for further evaluation of his  glucose levels.   PROBLEM LIST AT THE TIME OF DISCHARGE:  1. Acute inferior myocardial infarction.  2. Percutaneous transluminal coronary angioplasty stenting of the right     coronary artery performed on Jul 24, 2002 by Dr. Chales Abrahams with minimal     residual disease as noted above.  Ejection fraction 40%.  3. Elevated glucose levels with no previous history of diabetes.  4. Dyslipidemia.  5. Mild obesity.  6. Hypertension.  7. Positive family history of coronary artery disease.     Delton See, P.A. [facility]                  Learta Codding, M.D.    DR/MEDQ  D:  07/27/2002  T:  07/28/2002  Job:  [id number]   cc:   [facility]  [address]

## 2010-08-08 ENCOUNTER — Other Ambulatory Visit (INDEPENDENT_AMBULATORY_CARE_PROVIDER_SITE_OTHER): Payer: BC Managed Care – PPO | Admitting: *Deleted

## 2010-08-08 DIAGNOSIS — I4891 Unspecified atrial fibrillation: Secondary | ICD-10-CM

## 2010-08-09 LAB — CBC WITH DIFFERENTIAL/PLATELET
Basophils Relative: 1.2 % (ref 0.0–3.0)
Eosinophils Absolute: 0.1 10*3/uL (ref 0.0–0.7)
HCT: 45.7 % (ref 39.0–52.0)
Hemoglobin: 15.7 g/dL (ref 13.0–17.0)
Lymphocytes Relative: 37.2 % (ref 12.0–46.0)
Lymphs Abs: 2.6 10*3/uL (ref 0.7–4.0)
MCHC: 34.5 g/dL (ref 30.0–36.0)
MCV: 89.6 fl (ref 78.0–100.0)
Monocytes Absolute: 0.6 10*3/uL (ref 0.1–1.0)
Neutro Abs: 3.6 10*3/uL (ref 1.4–7.7)
RBC: 5.09 Mil/uL (ref 4.22–5.81)

## 2010-08-09 LAB — BASIC METABOLIC PANEL
BUN: 19 mg/dL (ref 6–23)
Chloride: 101 mEq/L (ref 96–112)
Creatinine, Ser: 1.1 mg/dL (ref 0.4–1.5)

## 2010-08-15 ENCOUNTER — Ambulatory Visit (HOSPITAL_COMMUNITY)
Admission: RE | Admit: 2010-08-15 | Discharge: 2010-08-16 | Disposition: A | Discharge status: Home / Self Care | Payer: BC Managed Care – PPO | Source: Ambulatory Visit | Attending: Internal Medicine | Admitting: Internal Medicine

## 2010-08-15 DIAGNOSIS — E669 Obesity, unspecified: Secondary | ICD-10-CM | POA: Insufficient documentation

## 2010-08-15 DIAGNOSIS — Z01810 Encounter for preprocedural cardiovascular examination: Secondary | ICD-10-CM | POA: Insufficient documentation

## 2010-08-15 DIAGNOSIS — I10 Essential (primary) hypertension: Secondary | ICD-10-CM | POA: Insufficient documentation

## 2010-08-15 DIAGNOSIS — E785 Hyperlipidemia, unspecified: Secondary | ICD-10-CM | POA: Insufficient documentation

## 2010-08-15 DIAGNOSIS — I2510 Atherosclerotic heart disease of native coronary artery without angina pectoris: Secondary | ICD-10-CM | POA: Insufficient documentation

## 2010-08-15 DIAGNOSIS — I4891 Unspecified atrial fibrillation: Secondary | ICD-10-CM

## 2010-08-15 LAB — APTT: aPTT: 40 seconds — ABNORMAL HIGH (ref 24–37)

## 2010-08-15 LAB — POCT ACTIVATED CLOTTING TIME
Activated Clotting Time: 299 seconds
Activated Clotting Time: 305 seconds
Activated Clotting Time: 158 seconds

## 2010-08-16 HISTORY — PX: OTHER SURGICAL HISTORY: SHX169

## 2010-08-18 ENCOUNTER — Ambulatory Visit (HOSPITAL_BASED_OUTPATIENT_CLINIC_OR_DEPARTMENT_OTHER): Payer: BC Managed Care – PPO | Attending: Internal Medicine

## 2010-08-18 ENCOUNTER — Telehealth: Payer: Self-pay | Admitting: Internal Medicine

## 2010-08-18 DIAGNOSIS — G4737 Central sleep apnea in conditions classified elsewhere: Secondary | ICD-10-CM | POA: Insufficient documentation

## 2010-08-18 DIAGNOSIS — I4891 Unspecified atrial fibrillation: Secondary | ICD-10-CM | POA: Insufficient documentation

## 2010-08-18 DIAGNOSIS — G4733 Obstructive sleep apnea (adult) (pediatric): Secondary | ICD-10-CM | POA: Insufficient documentation

## 2010-08-18 DIAGNOSIS — R0683 Snoring: Secondary | ICD-10-CM

## 2010-08-18 NOTE — Telephone Encounter (Signed)
Had physical today and back in  a-fib

## 2010-08-18 NOTE — Progress Notes (Signed)
Addended by: Dennis Bast F on: 08/18/2010 11:21 AM   Modules accepted: Orders

## 2010-08-18 NOTE — Telephone Encounter (Signed)
Went out of rhythm on Thurs His HR 89-96  BP 139/89  Will call him back on Monday after discussing with Dr Johney Frame  Also need to make sure his return to work papers have been resent

## 2010-08-21 ENCOUNTER — Encounter: Payer: Self-pay | Admitting: Cardiology

## 2010-08-21 ENCOUNTER — Telehealth: Payer: Self-pay | Admitting: Internal Medicine

## 2010-08-21 ENCOUNTER — Ambulatory Visit: Payer: BC Managed Care – PPO | Admitting: Cardiology

## 2010-08-21 ENCOUNTER — Encounter: Payer: Self-pay | Admitting: *Deleted

## 2010-08-21 ENCOUNTER — Encounter: Payer: BC Managed Care – PPO | Admitting: *Deleted

## 2010-08-21 DIAGNOSIS — I4891 Unspecified atrial fibrillation: Secondary | ICD-10-CM

## 2010-08-21 MED ORDER — AMIODARONE HCL 200 MG PO TABS: 200.0000 mg | ORAL_TABLET | Freq: Two times a day (BID) | ORAL | Status: DC

## 2010-08-21 NOTE — Telephone Encounter (Signed)
Per pt calling. Need a note to return to work on tomorrow. Status of FMLA paperwork

## 2010-08-21 NOTE — Telephone Encounter (Signed)
Spoke with patient  Papers refaxed  He will stop by today to get a note for work  Also showed Dr Johney Frame his EKG and he wants to start him on Amiodarone 200mg  bid  Have an EKG in two weeks  Around 09/04/10

## 2010-08-23 DIAGNOSIS — I4891 Unspecified atrial fibrillation: Secondary | ICD-10-CM

## 2010-08-23 DIAGNOSIS — G4733 Obstructive sleep apnea (adult) (pediatric): Secondary | ICD-10-CM

## 2010-08-23 DIAGNOSIS — G4737 Central sleep apnea in conditions classified elsewhere: Secondary | ICD-10-CM

## 2010-08-24 NOTE — Procedures (Signed)
NAME:  Scott White, Scott White NO.:  [id number]  MEDICAL RECORD NO.:  [id number]          PATIENT TYPE:  OUT  LOCATION:  SLEEP CENTER                 FACILITY[facility]  [facility]  PHYSICIAN:  Barbaraann Share, MD,FCCPDATE OF BIRTH:  08/19/60  DATE OF STUDY:  08/18/2010                           NOCTURNAL POLYSOMNOGRAM  REFERRING PHYSICIAN:  JAMES D ALLRED  INDICATION FOR STUDY:  Hypersomnia with sleep apnea.  EPWORTH SLEEPINESS SCORE:  6.  MEDICATIONS:  SLEEP ARCHITECTURE:  The patient had a total sleep time of 349 minutes with no slow wave sleep and only 46 minutes of REM.  Sleep onset latency was prolonged at 43 minutes, and REM onset was very prolonged at 241 minutes.  Sleep efficiency was moderately reduced at 83%.  RESPIRATORY DATA:  The patient was found to have 17 obstructive apneas, 50 central apneas, and 71 obstructive hypopneas, giving him an apnea- hypopnea index of 24 events per hour.  The events occurred all in the supine position and there was moderate snoring noted throughout.  The patient did not meet split night criteria secondary to the majority of his events occurring well after 2 a.m.  OXYGEN DATA:  There was O2 desaturation as low as 87% with his obstructive and central events.  CARDIAC DATA:  The patient was noted to be in atrial fibrillation with a controlled ventricular response throughout.  He did have fairly frequent wide complex QRS occurrences that appeared to possibly be paced.  MOVEMENT-PARASOMNIA:  The patient was found to have moderate numbers of periodic leg movements, but very few resulted in arousal or awakening. This is felt to be clinically insignificant.  IMPRESSIONS-RECOMMENDATIONS: 1. Moderate obstructive and central sleep apnea with an AHI of 24     events per hour and O2 desaturation as low as 87%.  Treatment for     his complex apnea can include a trial of weight loss alone, upper     airway surgery, dental appliance, and also  CPAP.  Clinical     correlation is suggested. 2. The patient was found to have atrial fibrillation with a controlled     ventricular response throughout the night.  He had superimposed     over this frequent wide complex beats that appeared to be paced?     Barbaraann Share, MD,FCCP Diplomate, American Board of Sleep Medicine Electronically Signed    [facility]/MEDQ  D:  08/23/2010 21:26:30  T:  08/24/2010 11:43:01  Job:  [id number]

## 2010-08-28 ENCOUNTER — Encounter (HOSPITAL_BASED_OUTPATIENT_CLINIC_OR_DEPARTMENT_OTHER): Payer: BC Managed Care – PPO

## 2010-08-28 NOTE — Op Note (Signed)
NAME:  SEITH, AIKEY NO.:  [id number]  MEDICAL RECORD NO.:  [id number]           PATIENT TYPE:  I  LOCATION:  2920                         FACILITY:  [facility]  PHYSICIAN:  Hillis Range, MD       DATE OF BIRTH:  [birth year]  DATE OF PROCEDURE: DATE OF DISCHARGE:                              OPERATIVE REPORT   SURGEON:  Hillis Range, MD  PREPROCEDURE DIAGNOSIS:  Persistent atrial fibrillation.  POSTPROCEDURE DIAGNOSES:  Persistent atrial fibrillation.  PROCEDURES: 1. Comprehensive EP study. 2. Coronary sinus pacing and recording. 3. A 3-D mapping of SVT. 4. Radiofrequency ablation of SVT. 5. Arterial blood pressure monitoring. 6. Intracardiac echocardiography. 7. Transseptal puncture of an intact septum. 8. Pulmonary venography. 9. External cardioversion.  INTRODUCTION:  Mr. Gentle is a pleasant 50 year old gentleman with a history of symptomatic persistent atrial fibrillation who presents today for EP study and radiofrequency ablation.  He has previously failed medical therapy with Multaq, metoprolol, and Cardizem.  He reports ongoing symptomatic atrial fibrillation.  He therefore presents today for EP study and radiofrequency ablation.  DESCRIPTION OF PROCEDURE:  Informed written consent was obtained and the patient was brought to the electrophysiology lab in the fasting state. He was adequately sedated with intravenous medications as outlined in the anesthesia report.  Prior to his ablation, the patient underwent transesophageal echocardiography as performed by Dr. Arvilla Meres and reported separately.  The patient did have some difficulty swallowing the TEE probe; however, this was subsequently achieved after several attempts by Dr. Gala Romney.  The patient was noted to have mild left atrial enlargement with no left atrial appendage.  There was no evidence of thrombus within the left atrium.  The patient was then prepped and draped in the usual  sterile fashion by the EP lab staff. Using a percutaneous Seldinger technique, the 7-French and one 8-French hemostasis sheaths were placed in the right common femoral vein.  A 4- [place] hemostasis sheath was placed in the right common femoral artery for blood pressure monitoring.  An 11-French hemostasis sheath was placed in the left common femoral vein.  A 7-French Biosense Webster decapolar coronary sinus catheter was introduced through the right common femoral vein and advanced into the coronary sinus for recording and pacing from this location.  The patient was noted to have an anteriorly displaced coronary sinus.  A 6-French quadripolar Josephson catheter was introduced through the right common femoral vein and advanced into the right ventricle for recording and pacing.  This catheter was then pulled back to the His bundle location.  The patient presented to the electrophysiology lab in atrial fibrillation.  His QRS duration was 75 msec with a QT interval of 366 msec.  His HV interval was 49 msec.  A 10-French Biosense Webster AcuNav intracardiac echocardiography catheter was introduced through the left common femoral vein and advanced into the right atrium.  Intracardiac echocardiography was performed which revealed a mildly enlarged left atrium.  The patient was noted to have four separate pulmonary veins with no evidence of pulmonary vein stenosis.  The middle right common femoral vein sheath was exchanged for an 8.5-French SL-2 transseptal  sheath and transseptal access was achieved in a standard fashion using a Brockenbrough needle under biplane fluoroscopy with intracardiac echocardiography confirmation of the transseptal puncture.  Once transseptal access had been achieved, heparin was administered intravenously and intra- arterially with a target ACT of greater than 350 seconds.  A 6-French multipurpose angiographic catheter with guidewire was introduced through the  transseptal sheath and positioned over the mouth of all four pulmonary veins.  Pulmonary venograms were performed by hand injection of nonionic contrast.  This demonstrated a moderate-sized left atrium. The patient was noted to have four separate pulmonary veins with no evidence of pulmonary vein stenosis.  The pulmonary veins were all moderate in size.  The angiographic catheter was then removed.  The His bundle catheter was removed and in its place a 3.5-mm Biosense Webster EZ Steer cervical ablation catheter was advanced into the right atrium. The transseptal sheath was pulled back into the IVC over a guidewire. The ablation catheter was advanced across the transseptal hole using the wire as a guide.  The transseptal sheath was then re-advanced over the guidewire into the left atrium.  A dual decapolar circular mapping catheter was introduced through the transseptal sheath and positioned over the mouth of all four pulmonary veins.  Three-dimensional electroanatomical mapping was performed using [employer].  The patient was noted to have prodigious conduction within all four pulmonary veins at baseline.  He underwent successful sequential electrical isolation and anatomical encircling of all four pulmonary veins using radiofrequency current with a circular mapping catheter as a guide.  Following ablation, complex fractionated atrial electrograms were identified and successfully ablated along the roof of the left atrium, at the base of the left atrial appendage, along the lateral wall of the left atrium, and along the mitral valve annulus above the coronary sinus.  After ablation of complex fractionated atrial electrograms, the patient was then successfully cardioverted to sinus rhythm with a single synchronized 360-joule biphasic shock with cardioversion electrodes in the anterior-posterior thoracic configuration.  He remained in sinus rhythm thereafter.  He was noted to have  frequent premature ventricular contractions.  These PVCs were of a right bundle-branch left superior axis with a duration of 132 msec. Following ablation, ventricular pacing was performed which revealed VA dissociation when pacing at a cycle length of 700 msec.  Atrial pacing was then performed which revealed decremental AV conduction with no evidence of PR greater than RR.  The AV Wenckebach cycle length was 520 msec.  Atrial pacing was continued down to a cycle length of 300 msec with no arrhythmias induced.  Following ablation, the AH interval measured 91 msec with an HV interval of 52 msec.  Left atrial pacing was performed following ablation and confirmed pulmonary vein isolation. The procedure was therefore considered completed.  All catheters were removed and the sheaths were aspirated and flushed.  Intracardiac echocardiography was again performed and revealed no pericardial effusion.  The patient was transferred to the recovery area for sheath removal per protocol.  A limited bedside transthoracic echocardiogram confirmed no pericardial effusion.  There were no early apparent complications.  CONCLUSIONS: 1. Atrial fibrillation upon presentation. 2. Successful electrical isolation and anatomical encircling of all     four pulmonary veins using radiofrequency current. 3. Complex fractionated atrial electrograms were identified and     ablated along the roof of the left atrium at the base of the left     atrial appendage along the lateral wall of the left atrium and  along the mitral valve annulus     above the coronary sinus. 4. Successful cardioversion to sinus rhythm. 5. No early apparent complications.     Hillis Range, MD     JA/MEDQ  D:  08/15/2010  T:  08/16/2010  Job:  [id number]  cc:   Learta Codding, MD,FACC  Electronically Signed by Hillis Range MD on 08/28/2010 09:18:15 AM

## 2010-08-28 NOTE — Discharge Summary (Signed)
NAME:  Scott White, Scott White NO.:  [id number]  MEDICAL RECORD NO.:  [id number]           PATIENT TYPE:  I  LOCATION:  2920                         FACILITY:  [facility]  PHYSICIAN:  Hillis Range, MD       DATE OF BIRTH:  08-Jul-1960  DATE OF ADMISSION:  08/15/2010 DATE OF DISCHARGE:  08/16/2010                              DISCHARGE SUMMARY   PRIMARY CARE PHYSICIAN:  Lenon Curt. Chilton Si, MD  PRIMARY CARDIOLOGIST:  Learta Codding, MD,FACC  ELECTROPHYSIOLOGIST:  Hillis Range, MD.  PRIMARY DIAGNOSIS:  Atrial fibrillation.  SECONDARY DIAGNOSES: 1. Coronary artery disease - status post inferior myocardial     infarction in May 2004 with intervention of the right     coronary artery.  Ejection fraction is 60% by echocardiogram. 2. Hypertension. 3. Obesity. 4. Hyperlipidemia.  ALLERGIES:  The patient has no known drug allergies.  PROCEDURES THIS ADMISSION: 1. TEE on Aug 15, 2010 by Dr. Gala Romney demonstrated an ejection     fraction of 55% with no clot in the left atrial appendage. 2. Electrophysiology study and radiofrequency catheter ablation of     atrial fibrillation on Aug 15, 2010 by Dr. Johney Frame.  This study     demonstrated atrial fibrillation upon presentation.     Successful electrical isolation and anatomical encircling of all 4     pulmonary veins, complex fractionated atrial electrocardiogram were     identified and ablated.  The patient also underwent successful     cardioversion to sinus rhythm during the procedure and had no early     apparent complications.  BRIEF HISTORY AND PRESENT ILLNESS:  Scott White is a 50 year old male with a history of coronary artery disease and atrial fibrillation for which he failed medical therapy with Multaq, metoprolol, and Cardizem.  He was referred to Dr. Johney Frame in the outpatient setting for alternative treatment options.  Risks, benefits, and alternatives of radiofrequency catheter ablation were discussed with the patient.   He wished to proceed.  HOSPITAL COURSE:  The patient was admitted on Aug 15, 2010 with planned ablation of atrial fibrillation.  This was carried out by Dr. Johney Frame with the details as outlined above.  The patient was monitored on telemetry overnight which demonstrated sinus rhythm with PVCs.  The patient's groin incisions were without hematoma or bruise.  Dr. Johney Frame examined the patient and considered him stable for discharge.  DISCHARGE INSTRUCTIONS: 1. Increase activity slowly. 2. No driving for 4 days. 3. Follow a low-sodium, heart-healthy diet. 4. Keep incisions clean and dry.  FOLLOWUP APPOINTMENTS: 1. Learta Codding, MD,FACC on August 21, 2010 at 3:15 p.m. 2. Hillis Range, MD on October 01, 2010 at 10 a.m.  DISCHARGE MEDICATIONS: 1. Pradaxa 150 mg twice daily. 2. Niacin 100 mg daily. 3. Multivitamin daily. 4. Metoprolol XL succinate 25 mg daily. 5. Lisinopril/HCTZ 20/25 mg daily. 6. Fish oil 1000 mg daily. 7. Diltiazem 120 mg daily. 8. Aspirin 81 mg daily. 9. Artificial tears 1 drop in both eyes every 4 hours as needed for     dry eyes.  DISPOSITION:  The patient was seen and examined by Dr.  Kyannah Climer on Aug 16, 2010 and considered stable for discharge.  DURATION OF DISCHARGE ENCOUNTER:  35 minutes.     Gypsy Balsam, RN,BSN   ______________________________ Hillis Range, MD    AS/MEDQ  D:  08/16/2010  T:  08/16/2010  Job:  [id number]  cc:   Learta Codding, MD,FACC  Electronically Signed by Gypsy Balsam RNBSN on 08/23/2010 05:36:35 PM Electronically Signed by Hillis Range MD on 08/28/2010 09:18:18 AM

## 2010-09-04 ENCOUNTER — Telehealth: Payer: Self-pay | Admitting: Internal Medicine

## 2010-09-04 DIAGNOSIS — G4730 Sleep apnea, unspecified: Secondary | ICD-10-CM

## 2010-09-04 NOTE — Telephone Encounter (Signed)
Spoke with patient and will have him come in tomorrow for EKG at 4pm  Will refer to Dr [name] for sleep apnea

## 2010-09-04 NOTE — Telephone Encounter (Signed)
Pt calling re results of sleep test, pt taking amiodarone for 2 full weeks now wants to know how long to be on it before having an ekg

## 2010-09-05 ENCOUNTER — Other Ambulatory Visit (INDEPENDENT_AMBULATORY_CARE_PROVIDER_SITE_OTHER): Payer: BC Managed Care – PPO | Admitting: *Deleted

## 2010-09-05 ENCOUNTER — Encounter: Payer: Self-pay | Admitting: *Deleted

## 2010-09-05 ENCOUNTER — Telehealth: Payer: Self-pay | Admitting: *Deleted

## 2010-09-05 DIAGNOSIS — I4891 Unspecified atrial fibrillation: Secondary | ICD-10-CM

## 2010-09-05 NOTE — Telephone Encounter (Signed)
Pt here for EKG  Still in afib  Will arrange DCCV for 09/11/10 with Dr Tenny Craw

## 2010-09-06 LAB — MAGNESIUM: Magnesium: 2.1 mg/dL (ref 1.5–2.5)

## 2010-09-06 LAB — CBC WITH DIFFERENTIAL/PLATELET
Eosinophils Relative: 1.7 % (ref 0.0–5.0)
HCT: 42.1 % (ref 39.0–52.0)
Lymphocytes Relative: 36.3 % (ref 12.0–46.0)
Monocytes Relative: 9.9 % (ref 3.0–12.0)
Neutrophils Relative %: 51.4 % (ref 43.0–77.0)
Platelets: 153 10*3/uL (ref 150.0–400.0)
WBC: 6.2 10*3/uL (ref 4.5–10.5)

## 2010-09-06 LAB — BASIC METABOLIC PANEL
BUN: 15 mg/dL (ref 6–23)
Creatinine, Ser: 1 mg/dL (ref 0.4–1.5)
GFR: 81.2 mL/min (ref >60.00)
Potassium: 3.9 mEq/L (ref 3.5–5.1)

## 2010-09-11 ENCOUNTER — Ambulatory Visit (HOSPITAL_COMMUNITY)
Admission: RE | Admit: 2010-09-11 | Discharge: 2010-09-11 | Disposition: A | Discharge status: Home / Self Care | Payer: BC Managed Care – PPO | Source: Ambulatory Visit | Attending: Internal Medicine | Admitting: Internal Medicine

## 2010-09-11 DIAGNOSIS — Z01810 Encounter for preprocedural cardiovascular examination: Secondary | ICD-10-CM | POA: Insufficient documentation

## 2010-09-11 DIAGNOSIS — I2510 Atherosclerotic heart disease of native coronary artery without angina pectoris: Secondary | ICD-10-CM | POA: Insufficient documentation

## 2010-09-11 DIAGNOSIS — G4733 Obstructive sleep apnea (adult) (pediatric): Secondary | ICD-10-CM | POA: Insufficient documentation

## 2010-09-11 DIAGNOSIS — I4891 Unspecified atrial fibrillation: Secondary | ICD-10-CM

## 2010-09-11 DIAGNOSIS — Z9861 Coronary angioplasty status: Secondary | ICD-10-CM | POA: Insufficient documentation

## 2010-09-19 ENCOUNTER — Encounter: Payer: Self-pay | Admitting: Internal Medicine

## 2010-09-21 ENCOUNTER — Encounter: Payer: Self-pay | Admitting: Pulmonary Disease

## 2010-09-22 ENCOUNTER — Ambulatory Visit (INDEPENDENT_AMBULATORY_CARE_PROVIDER_SITE_OTHER): Payer: BC Managed Care – PPO | Admitting: Pulmonary Disease

## 2010-09-22 ENCOUNTER — Encounter: Payer: Self-pay | Admitting: Pulmonary Disease

## 2010-09-22 VITALS — BP 118/82 | HR 57 | Temp 98.0°F | Ht 71.0 in | Wt 227.8 lb

## 2010-09-22 DIAGNOSIS — G4731 Primary central sleep apnea: Secondary | ICD-10-CM | POA: Insufficient documentation

## 2010-09-22 DIAGNOSIS — G4730 Sleep apnea, unspecified: Secondary | ICD-10-CM

## 2010-09-22 DIAGNOSIS — G4733 Obstructive sleep apnea (adult) (pediatric): Secondary | ICD-10-CM | POA: Insufficient documentation

## 2010-09-22 NOTE — Progress Notes (Signed)
  Subjective:    Patient ID: Scott White, male    DOB: 09-08-1960, 50 y.o.   MRN: [id number]  HPI The pt is a 50y/o male who I have been asked to see for management of complex apnea.  He recently underwent npsg where he was found to have both central and obstructive events, with AHI 24/hr.  He has afib, and there is concern about exacerbation from his sleep apnea.  The pt's history is significant for the following: -unsure if snores (lives alone), +occas choking arousals during sleep -frequent awakenings, +nonrestorative sleep -denies EDS, but can have decreased alertness after lunch.  No issues with tv in evening or driving distances. -weight neutral the past 5 yrs.  Sleep Questionnaire: What time do you typically go to bed?( Between what hours) 9:30 to 10:30pm How long does it take you to fall asleep? 30 mins How many times during the night do you wake up? 4 What time do you get out of bed to start your day? 4098 Do you drive or operate heavy machinery in your occupation? No How much has your weight changed (up or down) over the past two years? (In pounds) 3 lb (1.361 kg) Have you ever had a sleep study before? Yes If yes, location of study? [facility] If yes, date of study? 2012 Do you currently use CPAP? No Do you wear oxygen at any time? No     Review of Systems  Constitutional: Negative for fever and unexpected weight change.  HENT: Negative for ear pain, nosebleeds, congestion, sore throat, rhinorrhea, sneezing, trouble swallowing, dental problem, postnasal drip and sinus pressure.   Eyes: Negative for redness and itching.  Respiratory: Negative for cough, chest tightness, shortness of breath and wheezing.   Cardiovascular: Positive for palpitations. Negative for leg swelling.  Gastrointestinal: Negative for nausea and vomiting.  Genitourinary: Negative for dysuria.  Musculoskeletal: Negative for joint swelling.  Skin: Negative for rash.  Neurological: Negative for headaches.    Hematological: Does not bruise/bleed easily.  Psychiatric/Behavioral: Negative for dysphoric mood. The patient is not nervous/anxious.        Objective:   Physical Exam Constitutional:  Well developed, no acute distress  HENT:  Nares patent without discharge  Oropharynx without exudate, palate and uvula are mildly elongated.  Eyes:  Perrla, eomi, no scleral icterus  Neck:  No JVD, no TMG  Cardiovascular:  Normal rate, regular except for occasional ectopic, no rubs or gallops.  No murmurs        Intact distal pulses  Pulmonary :  Normal breath sounds, no stridor or respiratory distress   No rales, rhonchi, or wheezing  Abdominal:  Soft, nondistended, bowel sounds present.  No tenderness noted.   Musculoskeletal:  No lower extremity edema noted.  Lymph Nodes:  No cervical lymphadenopathy noted  Skin:  No cyanosis noted  Neurologic:  Alert, appropriate, moves all 4 extremities without obvious deficit.         Assessment & Plan:

## 2010-09-22 NOTE — Assessment & Plan Note (Signed)
The pt has moderate complex apnea with both obstructive and central events.  I suspect the centrals are more related to the obstructive events, and would respond to both cpap and dental appliance.  I have outlined a conservative approach with a trial of weight loss alone vs dental appliance or cpap while working on weight reduction.  He would like to think about both, and is willing to see a dentist in [place] who specializes in appliances.   I lean more toward more aggressive treatment given his afib, and the increased sympathetic drive associated with sleep apnea.

## 2010-09-22 NOTE — Patient Instructions (Signed)
Will refer you to dental medicine for consideration of a dental appliance.  If you decide to try cpap instead, please call me.   Work on weight loss

## 2010-09-28 ENCOUNTER — Encounter: Payer: Self-pay | Admitting: Internal Medicine

## 2010-10-02 ENCOUNTER — Encounter: Payer: BC Managed Care – PPO | Admitting: Internal Medicine

## 2010-10-10 ENCOUNTER — Telehealth: Payer: Self-pay | Admitting: Pulmonary Disease

## 2010-10-10 NOTE — Telephone Encounter (Signed)
Last ov pt was to decide on dental appliance vs CPAP- LMTCB

## 2010-10-11 NOTE — Telephone Encounter (Signed)
Spoke with pt. He states that he had tried using oral appliance and this did not give him relief. Wants to try CPAP. He states that his brother used to use CPAP and gave him his machine and so now just wants a mask. I advised KC will probably want him to have a new machine as well, but he just wants a mask. Pt aware KC gone until next wk and is okay with waiting. KC, pls advise, thanks!

## 2010-10-12 ENCOUNTER — Encounter: Payer: Self-pay | Admitting: Internal Medicine

## 2010-10-16 ENCOUNTER — Ambulatory Visit (INDEPENDENT_AMBULATORY_CARE_PROVIDER_SITE_OTHER): Payer: BC Managed Care – PPO | Admitting: Internal Medicine

## 2010-10-16 ENCOUNTER — Encounter: Payer: Self-pay | Admitting: Internal Medicine

## 2010-10-16 VITALS — BP 118/74 | HR 54 | Ht 71.0 in | Wt 223.4 lb

## 2010-10-16 DIAGNOSIS — I4891 Unspecified atrial fibrillation: Secondary | ICD-10-CM

## 2010-10-16 DIAGNOSIS — G4730 Sleep apnea, unspecified: Secondary | ICD-10-CM

## 2010-10-16 MED ORDER — AMIODARONE HCL 200 MG PO TABS: 200.0000 mg | ORAL_TABLET | Freq: Every day | ORAL | Status: DC

## 2010-10-16 NOTE — Progress Notes (Signed)
The patient presents today for routine electrophysiology followup.  He is doing well at this time.  He developed ERAF post ablation and required early cardioversion.  He has been maintaining sinus rhythm since that time and presently appears to feel well.  Today, he denies symptoms of palpitations, chest pain, shortness of breath, orthopnea, PND, lower extremity edema, dizziness, presyncope, syncope, or neurologic sequela.  The patient feels that he is tolerating medications without difficulties and is otherwise without complaint today.   Past Medical History  Diagnosis Date  . Atrial fibrillation     Persistent, s/p afib ablation by JA 08/16/10  . CAD (coronary artery disease)     Status post acute inferior wall myocardial infarction emergent bare metal stenting of the mid RCA, May 2004,  residual nonobstructive coronary artery disease Ejection fraction 60% by echocardiography 04/2007  . HTN (hypertension)   . Hyperlipidemia   . Sleep apnea     followed by Dr [name]   Past Surgical History  Procedure Date  . Cardiac catheterization 2004     Successful PTCA and stent placement in the mid right   coronary artery with an extra 99% narrowing to 0% with placement of a 3.5 x  20 mm Express 2 stent with improvement of TIMI grade 1 flow to TIMI grade 3.  . Atrial ablatio 08/16/10    afib ablation by JA    Current Outpatient Prescriptions  Medication Sig Dispense Refill  . amiodarone (PACERONE) 200 MG tablet Take 1 tablet (200 mg total) by mouth daily.  60 tablet  3  . aspirin 81 MG tablet Take 81 mg by mouth daily.        [name] diltiazem (CARDIZEM) 120 MG tablet Take 120 mg by mouth daily.       [name] lisinopril-hydrochlorothiazide (PRINZIDE,ZESTORETIC) 20-25 MG per tablet Take 1 tablet by mouth daily.        . metoprolol succinate (TOPROL-XL) 25 MG 24 hr tablet Take 25 mg by mouth daily.        . Multiple Vitamins-Minerals (CENTRUM PO) 1 tab po qd       . niacin 100 MG tablet Take 100 mg by mouth daily  with breakfast.        . nitroGLYCERIN (NITROSTAT) 0.4 MG SL tablet Place 0.4 mg under the tongue every 5 (five) minutes as needed.        . Omega-3 Fatty Acids (FISH OIL) 1200 MG CAPS 1 tab po qd       . PRADAXA 150 MG CAPS 150 mg. One po bid      . rosuvastatin (CRESTOR) 5 MG tablet Take 5 mg by mouth daily.        [name] DISCONTD: amiodarone (PACERONE) 200 MG tablet Take 1 tablet (200 mg total) by mouth 2 (two) times daily.  60 tablet  3    No Known Allergies  History   Social History  . Marital Status: Single    Spouse Name: N/A    Number of Children: N  . Years of Education: N/A   Occupational History  . Engineer, water    Social History Main Topics  . Smoking status: Never Smoker   . Smokeless tobacco: Never Used  . Alcohol Use: Yes     1 drink per day  . Drug Use: No  . Sexually Active: Not on file   Other Topics Concern  . Not on file   Social History Narrative   Pt lives in [place] alone.  Single.He  is a Engineer, water for [employer]    Family History  Problem Relation Age of Onset  . Coronary artery disease Mother    Physical Exam: Filed Vitals:   10/16/10 1558  BP: 118/74  Pulse: 54  Height: 5\' 11"  (1.803 m)  Weight: 223 lb 6.4 oz (101.334 kg)    GEN- The patient is well appearing, alert and oriented x 3 today.   Head- normocephalic, atraumatic Eyes-  Sclera clear, conjunctiva pink Ears- hearing intact Oropharynx- clear Neck- supple, no JVP Lymph- no cervical lymphadenopathy Lungs- Clear to ausculation bilaterally, normal work of breathing Heart- Regular rate and rhythm, no murmurs, rubs or gallops, PMI not laterally displaced GI- soft, NT, ND, + BS Extremities- no clubbing, cyanosis, or edema MS- no significant deformity or atrophy Skin- no rash or lesion Psych- euthymic mood, full affect Neuro- strength and sensation are intact  ekg today reveals sinus bradycardia 54 bpm, otherwise normal ekg  Assessment and Plan:

## 2010-10-16 NOTE — Assessment & Plan Note (Signed)
Doing well s/p ablation. Decrease amiodarone to 200mg  daily at this time. Continue pradaxa.  He will return in 6 weeks.  If he is maintaining sinus rhythm at that time, we will decrease amiodarone to 100mg  daily.  Our goal is to stop amiodarone once his atria have remodeled.

## 2010-10-16 NOTE — Assessment & Plan Note (Signed)
Weight loss advised He will continue to receive treatment under direction of Dr [name].

## 2010-10-16 NOTE — Patient Instructions (Addendum)
Your physician recommends that you schedule a follow-up appointment in: 6 weeks with Dr Johney Frame  Your physician has recommended you make the following change in your medication: decrease Amiodarone to 200mg  daily

## 2010-10-17 ENCOUNTER — Other Ambulatory Visit: Payer: Self-pay | Admitting: Pulmonary Disease

## 2010-10-17 DIAGNOSIS — G4730 Sleep apnea, unspecified: Secondary | ICD-10-CM

## 2010-10-17 NOTE — Telephone Encounter (Signed)
Let him know that he can't just get brother's machine and start "using cpap".  He will need to have dme check machine, see if it is working, and get pressure calibrated for him.  I am happy to get dme to due this.  Will send order to [facility].  Let pt know.

## 2010-10-18 NOTE — Telephone Encounter (Signed)
Spoke with pt and notified of recs per [facility]. He verbalized understanding and denied any further questions.

## 2010-10-19 ENCOUNTER — Other Ambulatory Visit: Payer: Self-pay | Admitting: *Deleted

## 2010-10-19 MED ORDER — METOPROLOL SUCCINATE ER 25 MG PO TB24: 25.0000 mg | ORAL_TABLET | Freq: Every day | ORAL | Status: DC

## 2010-11-07 ENCOUNTER — Encounter: Payer: Self-pay | Admitting: Internal Medicine

## 2010-11-27 ENCOUNTER — Encounter: Payer: Self-pay | Admitting: Internal Medicine

## 2010-11-27 ENCOUNTER — Ambulatory Visit (INDEPENDENT_AMBULATORY_CARE_PROVIDER_SITE_OTHER): Payer: BC Managed Care – PPO | Admitting: Internal Medicine

## 2010-11-27 VITALS — BP 122/80 | HR 63 | Ht 71.0 in | Wt 218.8 lb

## 2010-11-27 DIAGNOSIS — I4891 Unspecified atrial fibrillation: Secondary | ICD-10-CM

## 2010-11-27 MED ORDER — AMIODARONE HCL 200 MG PO TABS: 100.0000 mg | ORAL_TABLET | Freq: Every day | ORAL | Status: DC

## 2010-11-27 NOTE — Progress Notes (Signed)
The patient presents today for routine electrophysiology followup.  He is doing well at this time.  He has been maintaining sinus rhythm since his last visit to our office.  Today, he denies symptoms of palpitations, chest pain, shortness of breath, orthopnea, PND, lower extremity edema, dizziness, presyncope, syncope, or neurologic sequela.  The patient feels that he is tolerating medications without difficulties and is otherwise without complaint today.   Past Medical History  Diagnosis Date  . Atrial fibrillation     Persistent, s/p afib ablation by JA 08/16/10  . CAD (coronary artery disease)     Status post acute inferior wall myocardial infarction emergent bare metal stenting of the mid RCA, May 2004,  residual nonobstructive coronary artery disease Ejection fraction 60% by echocardiography 04/2007  . HTN (hypertension)   . Hyperlipidemia   . Sleep apnea     followed by Dr [name]   Past Surgical History  Procedure Date  . Cardiac catheterization 2004     Successful PTCA and stent placement in the mid right   coronary artery with an extra 99% narrowing to 0% with placement of a 3.5 x  20 mm Express 2 stent with improvement of TIMI grade 1 flow to TIMI grade 3.  . Atrial ablatio 08/16/10    afib ablation by JA    Current Outpatient Prescriptions  Medication Sig Dispense Refill  . amiodarone (PACERONE) 200 MG tablet Take 1 tablet (200 mg total) by mouth daily.  60 tablet  3  . aspirin 81 MG tablet Take 81 mg by mouth daily.        [name] diltiazem (CARDIZEM) 120 MG tablet Take 120 mg by mouth daily.       [name] lisinopril-hydrochlorothiazide (PRINZIDE,ZESTORETIC) 20-25 MG per tablet Take 1 tablet by mouth daily.        . metoprolol succinate (TOPROL-XL) 25 MG 24 hr tablet Take 1 tablet (25 mg total) by mouth daily.  30 tablet  6  . Multiple Vitamins-Minerals (CENTRUM PO) 1 tab po qd       . niacin 100 MG tablet Take 100 mg by mouth daily with breakfast.        . nitroGLYCERIN (NITROSTAT) 0.4 MG  SL tablet Place 0.4 mg under the tongue every 5 (five) minutes as needed.        . Omega-3 Fatty Acids (FISH OIL) 1200 MG CAPS 1 tab po qd       . PRADAXA 150 MG CAPS 150 mg. One po bid      . rosuvastatin (CRESTOR) 5 MG tablet Take 5 mg by mouth daily.          No Known Allergies  History   Social History  . Marital Status: Single    Spouse Name: N/A    Number of Children: N  . Years of Education: N/A   Occupational History  . Engineer, water    Social History Main Topics  . Smoking status: Never Smoker   . Smokeless tobacco: Never Used  . Alcohol Use: Yes     1 drink per day  . Drug Use: No  . Sexually Active: Not on file   Other Topics Concern  . Not on file   Social History Narrative   Pt lives in [place] alone.  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 Coronary artery disease Mother    Physical Exam: Filed Vitals:  11/27/10 1545  BP: 122/80  Pulse: 63  Height: 5\' 11"  (1.803 m)  Weight: 218 lb 12.8 oz (99.247 kg)    GEN- The patient is well appearing, alert and oriented x 3 today.   Head- normocephalic, atraumatic Eyes-  Sclera clear, conjunctiva pink Ears- hearing intact Oropharynx- clear Neck- supple, no JVP Lymph- no cervical lymphadenopathy Lungs- Clear to ausculation bilaterally, normal work of breathing Heart- Regular rate and rhythm, no murmurs, rubs or gallops, PMI not laterally displaced GI- soft, NT, ND, + BS Extremities- no clubbing, cyanosis, or edema MS- no significant deformity or atrophy Skin- no rash or lesion Psych- euthymic mood, full affect Neuro- strength and sensation are intact  ekg today reveals sinus rhythm 69 bpm,  Nonspecific ST/ T changes  Assessment and Plan:

## 2010-11-27 NOTE — Patient Instructions (Addendum)
Your physician recommends that you schedule a follow-up appointment in: 3 months with Dr Johney Frame  Decrease Amiodarone to 100mg  daily

## 2010-11-27 NOTE — Assessment & Plan Note (Signed)
Doing well s/p ablation. Decrease amiodarone to 100mg  daily at this time. Continue pradaxa.  He will return in 3 months.  If he is maintaining sinus rhythm at that time, we will stop amiodarone.  Our goal is to stop amiodarone once his atria have remodeled.

## 2010-11-29 LAB — HEPATIC FUNCTION PANEL
Bilirubin, Direct: 0.1 mg/dL (ref 0.0–0.3)
Total Bilirubin: 0.6 mg/dL (ref 0.3–1.2)

## 2010-12-04 ENCOUNTER — Other Ambulatory Visit: Payer: Self-pay | Admitting: Internal Medicine

## 2010-12-08 ENCOUNTER — Encounter: Payer: Self-pay | Admitting: *Deleted

## 2011-01-01 ENCOUNTER — Telehealth: Payer: Self-pay | Admitting: Internal Medicine

## 2011-01-01 ENCOUNTER — Other Ambulatory Visit: Payer: Self-pay | Admitting: *Deleted

## 2011-01-01 MED ORDER — DILTIAZEM HCL 120 MG PO TABS: 120.0000 mg | ORAL_TABLET | Freq: Every day | ORAL | Status: DC

## 2011-01-01 NOTE — Telephone Encounter (Signed)
Returned call to patient and discussed with him what is going on  Thursday had a sharpe pain, lasting seconds and then Friday got some heaviness that lasted 5-32min (sitting at desk) Saturday he felt SOB, and Wynelle Link was a good day  Walked 2 miles no problems  Last night woke up and felt heaviness in chest, denies SOB, lasted 15-20 min  Around 8:00am he says had a heaviness in chest described has a real heavy contraction with a dull pain(lasted 5-10 sec)   Feels good now  Just wondering if he needs a stress test or some kind of follow up  He was previously followed by Dr DeGent(prior to ablation) but wants to be seen in the [place] office  Will forward to Dr Johney Frame for review and call patient back

## 2011-01-01 NOTE — Telephone Encounter (Signed)
Pain sounds atypical.  Would recommend that if he has persistent pain that he go to the ER.  Otherwise, please schedule with Tereso Newcomer this week or I could add him to my schedule on Wednesday.

## 2011-01-01 NOTE — Telephone Encounter (Signed)
Pt would like for you to call him regarding scheduling an ekg.  He had some issues and would like to get some test scheduled.  Pl call him back

## 2011-01-02 ENCOUNTER — Telehealth: Payer: Self-pay | Admitting: *Deleted

## 2011-01-02 ENCOUNTER — Other Ambulatory Visit: Payer: Self-pay | Admitting: *Deleted

## 2011-01-02 MED ORDER — DILTIAZEM HCL ER COATED BEADS 120 MG PO CP24: 120.0000 mg | ORAL_CAPSULE | Freq: Every day | ORAL | Status: DC

## 2011-01-02 NOTE — Telephone Encounter (Signed)
Per pharmacist, patient normally get diltiazem er 120mg  and our office sent back plain diltiazem. Nurse informed pharmacist to continue same dosage as before and we would correct in chart. Patient to remain on cardizem cd 120mg  daily.

## 2011-01-03 NOTE — Telephone Encounter (Signed)
Will set patient up with Scott White for appointment to follow up on pain per Dr Johney Frame

## 2011-01-15 ENCOUNTER — Other Ambulatory Visit: Payer: Self-pay | Admitting: *Deleted

## 2011-01-15 MED ORDER — LISINOPRIL-HYDROCHLOROTHIAZIDE 20-25 MG PO TABS: 1.0000 | ORAL_TABLET | Freq: Every day | ORAL | Status: DC

## 2011-02-24 ENCOUNTER — Ambulatory Visit (INDEPENDENT_AMBULATORY_CARE_PROVIDER_SITE_OTHER): Payer: BC Managed Care – PPO

## 2011-02-24 DIAGNOSIS — I10 Essential (primary) hypertension: Secondary | ICD-10-CM

## 2011-02-24 DIAGNOSIS — R7989 Other specified abnormal findings of blood chemistry: Secondary | ICD-10-CM

## 2011-03-05 ENCOUNTER — Ambulatory Visit: Payer: BC Managed Care – PPO | Admitting: Internal Medicine

## 2011-03-08 ENCOUNTER — Encounter: Payer: Self-pay | Admitting: Internal Medicine

## 2011-03-08 ENCOUNTER — Ambulatory Visit (INDEPENDENT_AMBULATORY_CARE_PROVIDER_SITE_OTHER): Payer: BC Managed Care – PPO | Admitting: Internal Medicine

## 2011-03-08 VITALS — BP 133/80 | HR 59 | Resp 18 | Ht 71.0 in | Wt 215.8 lb

## 2011-03-08 DIAGNOSIS — I4891 Unspecified atrial fibrillation: Secondary | ICD-10-CM

## 2011-03-08 NOTE — Patient Instructions (Signed)
Your physician recommends that you schedule a follow-up appointment in: 3 months with Dr Johney Frame  Your physician has recommended you make the following change in your medication:  1) STOP Amiodarone 2) STOP Aspirin

## 2011-03-08 NOTE — Assessment & Plan Note (Signed)
Doing very well s/p ablation Stop amiodarone Continue pradaxa,  Stop ASA  Return in 3 months Consider stopping diltiazem if in sinus rhythm upon return

## 2011-03-08 NOTE — Progress Notes (Signed)
The patient presents today for routine electrophysiology followup.  He is doing well at this time.  He has been maintaining sinus rhythm since his last visit to our office.  Today, he denies symptoms of palpitations, chest pain, shortness of breath,  dizziness, presyncope, syncope, or neurologic sequela.  The patient feels that he is tolerating medications without difficulties and is otherwise without complaint today.   Past Medical History  Diagnosis Date  . Atrial fibrillation     Persistent, s/p afib ablation by JA 08/16/10  . CAD (coronary artery disease)     Status post acute inferior wall myocardial infarction emergent bare metal stenting of the mid RCA, May 2004,  residual nonobstructive coronary artery disease Ejection fraction 60% by echocardiography 04/2007  . HTN (hypertension)   . Hyperlipidemia   . Sleep apnea     followed by Dr [name]   Past Surgical History  Procedure Date  . Cardiac catheterization 2004     Successful PTCA and stent placement in the mid right   coronary artery with an extra 99% narrowing to 0% with placement of a 3.5 x  20 mm Express 2 stent with improvement of TIMI grade 1 flow to TIMI grade 3.  . Atrial ablatio 08/16/10    afib ablation by JA    Current Outpatient Prescriptions  Medication Sig Dispense Refill  . diltiazem (CARDIZEM CD) 120 MG 24 hr capsule Take 1 capsule (120 mg total) by mouth daily.  30 capsule  5  . lisinopril-hydrochlorothiazide (PRINZIDE,ZESTORETIC) 20-25 MG per tablet Take 1 tablet by mouth daily.  30 tablet  6  . metoprolol succinate (TOPROL-XL) 25 MG 24 hr tablet Take 1 tablet (25 mg total) by mouth daily.  30 tablet  6  . Multiple Vitamins-Minerals (CENTRUM PO) 1 tab po qd       . niacin 100 MG tablet Take 100 mg by mouth daily with breakfast.        . nitroGLYCERIN (NITROSTAT) 0.4 MG SL tablet Place 0.4 mg under the tongue every 5 (five) minutes as needed.        . Omega-3 Fatty Acids (FISH OIL) 1200 MG CAPS 1 tab po qd       .  PRADAXA 150 MG CAPS TAKE 1 CAPSULE TWICE A DAY  60 capsule  3  . rosuvastatin (CRESTOR) 5 MG tablet Take 5 mg by mouth daily.          No Known Allergies  History   Social History  . Marital Status: Single    Spouse Name: N/A    Number of Children: N  . Years of Education: N/A   Occupational History  . Engineer, water    Social History Main Topics  . Smoking status: Never Smoker   . Smokeless tobacco: Never Used  . Alcohol Use: Yes     1 drink per day  . Drug Use: No  . Sexually Active: Not on file   Other Topics Concern  . Not on file   Social History Narrative   Pt lives in [place] alone.  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 Coronary artery disease Mother    Physical Exam: Filed Vitals:   03/08/11 1639  BP: 133/80  Pulse: 59  Resp: 18  Height: 5\' 11"  (1.803 m)  Weight: 215 lb 12.8 oz (97.886 kg)    GEN- The patient is well appearing, alert and  oriented x 3 today.   Head- normocephalic, atraumatic Eyes-  Sclera clear, conjunctiva pink Ears- hearing intact Oropharynx- clear Neck- supple, no JVP Lymph- no cervical lymphadenopathy Lungs- Clear to ausculation bilaterally, normal work of breathing Heart- Regular rate and rhythm, no murmurs, rubs or gallops, PMI not laterally displaced GI- soft, NT, ND, + BS Extremities- no clubbing, cyanosis, or edema MS- no significant deformity or atrophy Skin- no rash or lesion Psych- euthymic mood, full affect Neuro- strength and sensation are intact  ekg today reveals sinus rhythm 59 bpm, rate PVCs (RBB L superior axis)  Assessment and Plan:

## 2011-04-02 ENCOUNTER — Other Ambulatory Visit: Payer: Self-pay

## 2011-04-02 MED ORDER — DABIGATRAN ETEXILATE MESYLATE 150 MG PO CAPS: 150.0000 mg | ORAL_CAPSULE | Freq: Two times a day (BID) | ORAL | Status: DC

## 2011-04-03 ENCOUNTER — Ambulatory Visit: Payer: Self-pay | Admitting: Pharmacist

## 2011-04-03 DIAGNOSIS — I4891 Unspecified atrial fibrillation: Secondary | ICD-10-CM

## 2011-04-03 DIAGNOSIS — Z7901 Long term (current) use of anticoagulants: Secondary | ICD-10-CM

## 2011-05-12 ENCOUNTER — Other Ambulatory Visit: Payer: Self-pay | Admitting: Cardiology

## 2011-06-06 ENCOUNTER — Ambulatory Visit (INDEPENDENT_AMBULATORY_CARE_PROVIDER_SITE_OTHER): Payer: BC Managed Care – PPO | Admitting: Internal Medicine

## 2011-06-06 ENCOUNTER — Encounter: Payer: Self-pay | Admitting: Internal Medicine

## 2011-06-06 ENCOUNTER — Encounter: Payer: Self-pay | Admitting: *Deleted

## 2011-06-06 VITALS — BP 118/69 | HR 97 | Resp 18 | Ht 71.0 in | Wt 213.4 lb

## 2011-06-06 DIAGNOSIS — I4891 Unspecified atrial fibrillation: Secondary | ICD-10-CM

## 2011-06-06 DIAGNOSIS — G4730 Sleep apnea, unspecified: Secondary | ICD-10-CM

## 2011-06-06 DIAGNOSIS — I10 Essential (primary) hypertension: Secondary | ICD-10-CM

## 2011-06-06 NOTE — Patient Instructions (Addendum)
Your physician has recommended that you have a Cardioversion (DCCV). Electrical Cardioversion uses a jolt of electricity to your heart either through paddles or wired patches attached to your chest. This is a controlled, usually prescheduled, procedure. Defibrillation is done under light anesthesia in the hospital, and you usually go home the day of the procedure. This is done to get your heart back into a normal rhythm. You are not awake for the procedure. Please see the instruction sheet given to you today.---06/15/11 at 9:00am  Your physician recommends that you schedule a follow-up appointment in: 5 weeks with Dr Johney Frame   Your physician has requested that you have an echocardiogram. Echocardiography is a painless test that uses sound waves to create images of your heart. It provides your doctor with information about the size and shape of your heart and how well your heart's chambers and valves are working. This procedure takes approximately one hour. There are no restrictions for this procedure.

## 2011-06-07 ENCOUNTER — Other Ambulatory Visit: Payer: Self-pay | Admitting: *Deleted

## 2011-06-07 DIAGNOSIS — I4891 Unspecified atrial fibrillation: Secondary | ICD-10-CM

## 2011-06-07 LAB — BASIC METABOLIC PANEL
BUN: 24 mg/dL — ABNORMAL HIGH (ref 6–23)
Chloride: 99 mEq/L (ref 96–112)
Creatinine, Ser: 1.1 mg/dL (ref 0.4–1.5)
GFR: 78.31 mL/min (ref >60.00)

## 2011-06-07 LAB — MAGNESIUM: Magnesium: 2 mg/dL (ref 1.5–2.5)

## 2011-06-07 LAB — CBC WITH DIFFERENTIAL/PLATELET
Eosinophils Relative: 0.7 % (ref 0.0–5.0)
MCV: 88.9 fl (ref 78.0–100.0)
Monocytes Absolute: 0.7 10*3/uL (ref 0.1–1.0)
Neutrophils Relative %: 53.2 % (ref 43.0–77.0)
Platelets: 175 10*3/uL (ref 150.0–400.0)
WBC: 6.8 10*3/uL (ref 4.5–10.5)

## 2011-06-10 ENCOUNTER — Encounter: Payer: Self-pay | Admitting: Internal Medicine

## 2011-06-10 NOTE — Progress Notes (Signed)
PCP:  Shade Flood, MD, MD  The patient presents today for routine electrophysiology followup.  Since last being seen in our clinic, the patient reports doing reasonably well.  He remains very active.  Unfortunately, he has returned to afib.  He is much less symptomatic with his afib now than he was prior to his ablation.  He does have frequent palpitations but feels that his exercise tolerance is mostly preserved.  Today, he denies symptoms of  chest pain, shortness of breath, orthopnea, PND, lower extremity edema, dizziness, presyncope, syncope, or neurologic sequela.  The patient feels that he is tolerating medications without difficulties and is otherwise without complaint today.   Past Medical History  Diagnosis Date  . Atrial fibrillation     Persistent, s/p afib ablation by JA 08/16/10  . CAD (coronary artery disease)     Status post acute inferior wall myocardial infarction emergent bare metal stenting of the mid RCA, May 2004,  residual nonobstructive coronary artery disease Ejection fraction 60% by echocardiography 04/2007  . HTN (hypertension)   . Hyperlipidemia   . Sleep apnea     followed by Dr [name]   Past Surgical History  Procedure Date  . Cardiac catheterization 2004     Successful PTCA and stent placement in the mid right   coronary artery with an extra 99% narrowing to 0% with placement of a 3.5 x  20 mm Express 2 stent with improvement of TIMI grade 1 flow to TIMI grade 3.  . Atrial ablatio 08/16/10    afib ablation by JA    Current Outpatient Prescriptions  Medication Sig Dispense Refill  . dabigatran (PRADAXA) 150 MG CAPS Take 1 capsule (150 mg total) by mouth 2 (two) times daily.  60 capsule  3  . diltiazem (CARDIZEM CD) 120 MG 24 hr capsule Take 1 capsule (120 mg total) by mouth daily.  30 capsule  5  . lisinopril-hydrochlorothiazide (PRINZIDE,ZESTORETIC) 20-25 MG per tablet Take 1 tablet by mouth daily.  30 tablet  6  . metoprolol succinate (TOPROL-XL) 25 MG 24  hr tablet TAKE 1 TABLET BY MOUTH EVERY DAY  30 tablet  6  . Multiple Vitamins-Minerals (CENTRUM PO) 1 tab po qd       . niacin 100 MG tablet Take 100 mg by mouth daily with breakfast.        . nitroGLYCERIN (NITROSTAT) 0.4 MG SL tablet Place 0.4 mg under the tongue every 5 (five) minutes as needed.        . Omega-3 Fatty Acids (FISH OIL) 1200 MG CAPS 1 tab po qd       . rosuvastatin (CRESTOR) 5 MG tablet Take 10 mg by mouth daily.         No Known Allergies  History   Social History  . Marital Status: Single    Spouse Name: N/A    Number of Children: N  . Years of Education: N/A   Occupational History  . Engineer, water    Social History Main Topics  . Smoking status: Never Smoker   . Smokeless tobacco: Never Used  . Alcohol Use: Yes     1 drink per day  . Drug Use: No  . Sexually Active: Not on file   Other Topics Concern  . Not on file   Social History Narrative   Pt lives in [place] alone.  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  Coronary artery disease Mother     ROS-  All systems are reviewed and are negative except as outlined in the HPI above   Physical Exam: Filed Vitals:   06/06/11 1553  BP: 118/69  Pulse: 97  Resp: 18  Height: 5\' 11"  (1.803 m)  Weight: 213 lb 6.4 oz (96.798 kg)    GEN- The patient is well appearing, alert and oriented x 3 today.   Head- normocephalic, atraumatic Eyes-  Sclera clear, conjunctiva pink Ears- hearing intact Oropharynx- clear Neck- supple, no JVP Lymph- no cervical lymphadenopathy Lungs- Clear to ausculation bilaterally, normal work of breathing Heart- irregular rate and rhythm, no murmurs, rubs or gallops, PMI not laterally displaced GI- soft, NT, ND, + BS Extremities- no clubbing, cyanosis, or edema MS- no significant deformity or atrophy Skin- no rash or lesion Psych- euthymic mood, full affect Neuro- strength and sensation are  intact  ekg today reveals afib with occasional PVCs, nonspecific ST/T changes  Assessment and Plan:

## 2011-06-10 NOTE — Assessment & Plan Note (Signed)
He is doing very well with weight loss.  I am hopeful that his sleep apnea will improve with this (however he has both central and obstructive dz)

## 2011-06-10 NOTE — Assessment & Plan Note (Signed)
Stable No change required today  

## 2011-06-10 NOTE — Assessment & Plan Note (Signed)
The patient has symptomatic persistent atrial fibrillation.  Unfortunately, he has returned to afib post ablation.  He is much less symptomatic at this point however. Therapeutic strategies for afib including medicine and ablation were discussed in detail with the patient today. At this point, he and I agree to proceed with cardioversion as this is his first recurrence post ablation.  Risks, benefits, and alternatives to cardioversion were discussed at length with the patient who wishes to proceed.  He has been adequately anticoagulated.  We will therefore scheduled cardioversion according to his work schedule (probably late next week).   If he has further afib then we will consider either tikosyn or repeat ablation at that point.

## 2011-06-13 ENCOUNTER — Encounter (HOSPITAL_COMMUNITY): Payer: Self-pay | Admitting: Pharmacy Technician

## 2011-06-15 ENCOUNTER — Encounter (HOSPITAL_COMMUNITY)
Admission: RE | Disposition: A | Discharge status: Home / Self Care | Payer: Self-pay | Source: Ambulatory Visit | Attending: Internal Medicine

## 2011-06-15 ENCOUNTER — Encounter (HOSPITAL_COMMUNITY): Payer: Self-pay | Admitting: Anesthesiology

## 2011-06-15 ENCOUNTER — Other Ambulatory Visit: Payer: Self-pay

## 2011-06-15 ENCOUNTER — Ambulatory Visit (HOSPITAL_COMMUNITY): Payer: BC Managed Care – PPO | Admitting: Anesthesiology

## 2011-06-15 ENCOUNTER — Ambulatory Visit (HOSPITAL_COMMUNITY)
Admission: RE | Admit: 2011-06-15 | Discharge: 2011-06-15 | Disposition: A | Discharge status: Home / Self Care | Payer: BC Managed Care – PPO | Source: Ambulatory Visit | Attending: Internal Medicine | Admitting: Internal Medicine

## 2011-06-15 DIAGNOSIS — I4891 Unspecified atrial fibrillation: Secondary | ICD-10-CM | POA: Insufficient documentation

## 2011-06-15 HISTORY — PX: CARDIOVERSION: SHX1299

## 2011-06-15 SURGERY — CARDIOVERSION
Anesthesia: General | Wound class: Clean

## 2011-06-15 MED ORDER — SODIUM CHLORIDE 0.9 % IJ SOLN: 3.0000 mL | Freq: Two times a day (BID) | INTRAMUSCULAR | Status: DC

## 2011-06-15 MED ORDER — HYDROCORTISONE 1 % EX CREA: 1.0000 "application " | TOPICAL_CREAM | Freq: Three times a day (TID) | CUTANEOUS | Status: DC | PRN

## 2011-06-15 MED ORDER — SODIUM CHLORIDE 0.9 % IJ SOLN: 3.0000 mL | INTRAMUSCULAR | Status: DC | PRN

## 2011-06-15 MED ORDER — HYDROCORTISONE 1 % EX CREA
TOPICAL_CREAM | CUTANEOUS | Status: AC
  Filled 2011-06-15: qty 28

## 2011-06-15 MED ORDER — SODIUM CHLORIDE 0.9 % IV SOLN: 250.0000 mL | INTRAVENOUS | Status: DC

## 2011-06-15 MED ORDER — PROPOFOL 10 MG/ML IV EMUL
INTRAVENOUS | Status: DC | PRN
  Administered 2011-06-15: 100 mg via INTRAVENOUS

## 2011-06-15 NOTE — Op Note (Signed)
Patient anesthetized by anesthesia with 100 mg Propofol IV  With pads in AP position attempt at cardioverson with 150J synchronized biphasic energy was unsuccessful for conversion.  Repeat at 200J was successful with return to NSR.  12 lead EKG pending  Procedure without complication.  Dietrich Pates

## 2011-06-15 NOTE — Transfer of Care (Signed)
Immediate Anesthesia Transfer of Care Note  Patient: Scott White  Procedure(s) Performed: Procedure(s) (LRB): CARDIOVERSION (N/A)  Patient Location: PACU  Anesthesia Type: MAC  Level of Consciousness: awake, alert , oriented and sedated  Airway & Oxygen Therapy: Patient Spontanous Breathing and Patient connected to nasal cannula oxygen  Post-op Assessment: Report given to PACU RN and Post -op Vital signs reviewed and stable  Post vital signs: Reviewed and stable  Complications: No apparent anesthesia complications

## 2011-06-15 NOTE — Anesthesia Postprocedure Evaluation (Signed)
  Anesthesia Post-op Note  Patient: Scott White  Procedure(s) Performed: Procedure(s) (LRB): CARDIOVERSION (N/A)  Patient Location: PACU  Anesthesia Type: MAC  Level of Consciousness: awake and alert   Airway and Oxygen Therapy: Patient Spontanous Breathing and Patient connected to nasal cannula oxygen  Post-op Pain: none  Post-op Assessment: Post-op Vital signs reviewed, Patient's Cardiovascular Status Stable and Respiratory Function Stable  Post-op Vital Signs: Reviewed and stable  Complications: No apparent anesthesia complications

## 2011-06-15 NOTE — Discharge Instructions (Signed)
Electrical Cardioversion Cardioversion is the delivery of a jolt of electricity to change the rhythm of the heart. Sticky patches or metal paddles are placed on the chest to deliver the electricity from a special device. This is done to restore a normal rhythm. A rhythm that is too fast or not regular keeps the heart from pumping well. Compared to medicines used to change an abnormal rhythm, cardioversion is faster and works better. It is also unpleasant and may dislodge blood clots from the heart. WHEN WOULD THIS BE DONE?  In an emergency:   There is low or no blood pressure as a result of the heart rhythm.   Normal rhythm must be restored as fast as possible to protect the brain and heart from further damage.   It may save a life.   For less serious heart rhythms, such as atrial fibrillation or flutter, in which:   The heart is beating too fast or is not regular.   The heart is still able to pump enough blood, but not as well as it should.   Medicine to change the rhythm has not worked.   It is safe to wait in order to allow time for preparation.  LET YOUR CAREGIVER KNOW ABOUT:   Every medicine you are taking. It is very important to do this! Know when to take or stop taking any of them.   Any time in the past that you have felt your heart was not beating normally.  RISKS AND COMPLICATIONS   Clots may form in the chambers of the heart if it is beating too fast. These clots may be dislodged during the procedure and travel to other parts of the body.   There is risk of a stroke during and after the procedure if a clot moves. Blood thinners lower this risk.   You may have a special test of your heart (TEE) to make sure there are no clots in your heart.  BEFORE THE PROCEDURE   You may have some tests to see how well your heart is working.   You may start taking blood thinners so your blood does not clot as easily.   Other drugs may be given to help your heart work better.   PROCEDURE (SCHEDULED)  The procedure is typically done in a hospital by a heart doctor (cardiologist).   You will be told when and where to go.   You may be given some medicine through an intravenous (IV) access to reduce discomfort and make you sleepy before the procedure.   Your whole body may move when the shock is delivered. Your chest may feel sore.   You may be able to go home after a few hours. Your heart rhythm will be watched to make sure it does not change.  HOME CARE INSTRUCTIONS   Only take medicine as directed by your caregiver. Be sure you understand how and when to take your medicine.   Learn how to feel your pulse and check it often.   Limit your activity for 48 hours.   Avoid caffeine and other stimulants as directed.  SEEK MEDICAL CARE IF:   You feel like your heart is beating too fast or your pulse is not regular.   You have any questions about your medicines.   You have bleeding that will not stop.  SEEK IMMEDIATE MEDICAL CARE IF:   You are dizzy or feel faint.   It is hard to breathe or you feel short of breath.     There is a change in discomfort in your chest.   Your speech is slurred or you have trouble moving your arm or leg on one side.   You get a muscle cramp.   Your fingers or toes turn cold or blue.  MAKE SURE YOU:   Understand these instructions.   Will watch your condition.   Will get help right away if you are not doing well or get worse.  Document Released: 02/23/2002 Document Revised: 02/22/2011 Document Reviewed: 06/25/2007 Exit[employer] Patient Information 2012 [employer]. 

## 2011-06-15 NOTE — Preoperative (Signed)
Beta Blockers   Reason not to administer Beta Blockers:Not Applicable 

## 2011-06-15 NOTE — Anesthesia Preprocedure Evaluation (Addendum)
Anesthesia Evaluation  Patient identified by MRN, date of birth, ID band Patient awake    Reviewed: Allergy & Precautions, H&P , NPO status , Patient's Chart, lab work & pertinent test results, reviewed documented beta blocker date and time   Airway Mallampati: II TM Distance: >3 FB Neck ROM: full    Dental  (+) Dental Advidsory Given, Caps and Teeth Intact   Pulmonary sleep apnea ,          Cardiovascular hypertension, On Home Beta Blockers and On Medications + CAD + dysrhythmias Atrial Fibrillation Rhythm:irregular Rate:Normal     Neuro/Psych    GI/Hepatic   Endo/Other    Renal/GU      Musculoskeletal   Abdominal   Peds  Hematology   Anesthesia Other Findings   Reproductive/Obstetrics                          Anesthesia Physical Anesthesia Plan  ASA: III  Anesthesia Plan: General   Post-op Pain Management:    Induction: Intravenous  Airway Management Planned: Mask  Additional Equipment:   Intra-op Plan:   Post-operative Plan:   Informed Consent: I have reviewed the patients History and Physical, chart, labs and discussed the procedure including the risks, benefits and alternatives for the proposed anesthesia with the patient or authorized representative who has indicated his/her understanding and acceptance.   Dental Advisory Given  Plan Discussed with: Anesthesiologist, CRNA and Surgeon  Anesthesia Plan Comments:        Anesthesia Quick Evaluation

## 2011-06-18 ENCOUNTER — Other Ambulatory Visit: Payer: Self-pay

## 2011-06-18 ENCOUNTER — Ambulatory Visit (HOSPITAL_COMMUNITY): Payer: BC Managed Care – PPO | Attending: Cardiology

## 2011-06-18 DIAGNOSIS — I10 Essential (primary) hypertension: Secondary | ICD-10-CM | POA: Insufficient documentation

## 2011-06-18 DIAGNOSIS — I4891 Unspecified atrial fibrillation: Secondary | ICD-10-CM

## 2011-06-18 DIAGNOSIS — I252 Old myocardial infarction: Secondary | ICD-10-CM | POA: Insufficient documentation

## 2011-06-18 DIAGNOSIS — E785 Hyperlipidemia, unspecified: Secondary | ICD-10-CM | POA: Insufficient documentation

## 2011-06-19 ENCOUNTER — Encounter (HOSPITAL_COMMUNITY): Payer: Self-pay | Admitting: Internal Medicine

## 2011-06-25 ENCOUNTER — Other Ambulatory Visit: Payer: Self-pay | Admitting: Cardiology

## 2011-07-11 ENCOUNTER — Ambulatory Visit (INDEPENDENT_AMBULATORY_CARE_PROVIDER_SITE_OTHER): Payer: BC Managed Care – PPO | Admitting: Internal Medicine

## 2011-07-11 ENCOUNTER — Encounter: Payer: Self-pay | Admitting: Internal Medicine

## 2011-07-11 VITALS — BP 127/75 | HR 66 | Resp 18 | Ht 71.0 in | Wt 215.4 lb

## 2011-07-11 DIAGNOSIS — Z7901 Long term (current) use of anticoagulants: Secondary | ICD-10-CM

## 2011-07-11 DIAGNOSIS — I10 Essential (primary) hypertension: Secondary | ICD-10-CM

## 2011-07-11 DIAGNOSIS — I4891 Unspecified atrial fibrillation: Secondary | ICD-10-CM

## 2011-07-11 DIAGNOSIS — I2510 Atherosclerotic heart disease of native coronary artery without angina pectoris: Secondary | ICD-10-CM

## 2011-07-11 MED ORDER — LISINOPRIL 20 MG PO TABS: 20.0000 mg | ORAL_TABLET | Freq: Every day | ORAL | Status: DC

## 2011-07-11 NOTE — Progress Notes (Signed)
PCP:  Shade Flood, MD, MD  The patient presents today for routine electrophysiology followup.  He was recently cardioverted but quickly returned to afib.  He reports some subjective improvement in overall health quality but has difficult articulating this.  He is probably minimally symptomatic with afib presently.  Today, he denies symptoms of  chest pain, shortness of breath, orthopnea, PND, lower extremity edema, dizziness, presyncope, syncope, or neurologic sequela.  The patient feels that he is tolerating medications without difficulties and is otherwise without complaint today.   Past Medical History  Diagnosis Date  . Atrial fibrillation     Persistent, s/p afib ablation by JA 08/16/10  . CAD (coronary artery disease)     Status post acute inferior wall myocardial infarction emergent bare metal stenting of the mid RCA, May 2004,  residual nonobstructive coronary artery disease Ejection fraction 60% by echocardiography 04/2007  . HTN (hypertension)   . Hyperlipidemia   . Sleep apnea     followed by Dr [name]   Past Surgical History  Procedure Date  . Cardiac catheterization 2004     Successful PTCA and stent placement in the mid right   coronary artery with an extra 99% narrowing to 0% with placement of a 3.5 x  20 mm Express 2 stent with improvement of TIMI grade 1 flow to TIMI grade 3.  . Atrial ablatio 08/16/10    afib ablation by JA  . Cardioversion 06/15/2011    Procedure: CARDIOVERSION;  Surgeon: Pricilla Riffle, MD;  Location: [facility] OR;  Service: Cardiovascular;  Laterality: N/A;    Current Outpatient Prescriptions  Medication Sig Dispense Refill  . dabigatran (PRADAXA) 150 MG CAPS Take 150 mg by mouth 2 (two) times daily.      [name] diltiazem (CARDIZEM CD) 120 MG 24 hr capsule Take 120 mg by mouth daily.      [name] diltiazem (CARDIZEM CD) 120 MG 24 hr capsule Take 1 capsule (120 mg total) by mouth daily.  30 capsule  1  . hydrocortisone cream 1 % Apply 1 application topically 3 (three)  times daily as needed (skin irritation).  30 g  0  . metoprolol succinate (TOPROL-XL) 25 MG 24 hr tablet Take 25 mg by mouth daily.      . Multiple Vitamins-Minerals (CENTRUM PO) Take 1 tablet by mouth 2 (two) times daily.       . niacin 100 MG tablet Take 100 mg by mouth daily with breakfast.        . nitroGLYCERIN (NITROSTAT) 0.4 MG SL tablet Place 0.4 mg under the tongue every 5 (five) minutes as needed. For chest apin      . omega-3 acid ethyl esters (LOVAZA) 1 G capsule Take 2 g by mouth daily.      . rosuvastatin (CRESTOR) 5 MG tablet Take 10 mg by mouth daily.       [name] lisinopril (PRINIVIL,ZESTRIL) 20 MG tablet Take 1 tablet (20 mg total) by mouth daily.  30 tablet  11    No Known Allergies  History   Social History  . Marital Status: Single    Spouse Name: N/A    Number of Children: N  . Years of Education: N/A   Occupational History  . Engineer, water    Social History Main Topics  . Smoking status: Never Smoker   . Smokeless tobacco: Never Used  . Alcohol Use: Yes     1 drink per day  . Drug Use: No  . Sexually Active:  Not on file   Other Topics Concern  . Not on file   Social History Narrative   Pt lives in [place] alone.  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 Coronary artery disease Mother     ROS-  All systems are reviewed and are negative except as outlined in the HPI above   Physical Exam: Filed Vitals:   07/11/11 1631  BP: 127/75  Pulse: 66  Resp: 18  Height: 5\' 11"  (1.803 m)  Weight: 215 lb 6.4 oz (97.705 kg)    GEN- The patient is well appearing, alert and oriented x 3 today.   Head- normocephalic, atraumatic Eyes-  Sclera clear, conjunctiva pink Ears- hearing intact Oropharynx- clear Neck- supple, no JVP Lymph- no cervical lymphadenopathy Lungs- Clear to ausculation bilaterally, normal work of breathing Heart- irregular rate and rhythm, no murmurs, rubs or  gallops, PMI not laterally displaced GI- soft, NT, ND, + BS Extremities- no clubbing, cyanosis, or edema MS- no significant deformity or atrophy Skin- no rash or lesion Psych- euthymic mood, full affect Neuro- strength and sensation are intact  ekg today reveals afib 70 bpm, Qtc 425, otherwise normal ekg  Assessment and Plan:

## 2011-07-11 NOTE — Patient Instructions (Signed)
Will set up patient for a Tikosyn Load  Your physician has recommended you make the following change in your medication:  1) Stop Lisinopril/HCTZ 2) Start Lisinopril 20 mg daily

## 2011-07-11 NOTE — Assessment & Plan Note (Signed)
Stable No change required today  

## 2011-07-11 NOTE — Assessment & Plan Note (Signed)
Stable As we anticipate starting tikosyn, I will stop hczt.

## 2011-07-11 NOTE — Assessment & Plan Note (Signed)
Recurrent persistent afib. Recent echo was reviewed which revealed normal EF and preserved LA size. Therapeutic strategies for afib including medicine and ablation were discussed in detail with the patient today.  At this time, he would like to try another AAD.  I would recommend tikosyn. I will therefore schedule admission over the next few weeks for initiation of tikosyn. If he has significant afib on tikosyn, then we will consider repeat catheter ablation at that point.

## 2011-07-11 NOTE — Assessment & Plan Note (Signed)
He reports compliance with pradaxa.

## 2011-07-26 ENCOUNTER — Inpatient Hospital Stay (HOSPITAL_COMMUNITY)
Admission: AD | Admit: 2011-07-26 | Discharge: 2011-07-30 | DRG: 139 | Disposition: A | Discharge status: Home / Self Care | Payer: BC Managed Care – PPO | Source: Ambulatory Visit | Attending: Internal Medicine | Admitting: Internal Medicine

## 2011-07-26 ENCOUNTER — Encounter (HOSPITAL_COMMUNITY): Payer: Self-pay | Admitting: *Deleted

## 2011-07-26 ENCOUNTER — Ambulatory Visit (INDEPENDENT_AMBULATORY_CARE_PROVIDER_SITE_OTHER): Payer: BC Managed Care – PPO | Admitting: Pharmacist

## 2011-07-26 ENCOUNTER — Other Ambulatory Visit: Payer: Self-pay

## 2011-07-26 VITALS — BP 130/78 | HR 67 | Ht 71.0 in | Wt 213.0 lb

## 2011-07-26 DIAGNOSIS — I252 Old myocardial infarction: Secondary | ICD-10-CM

## 2011-07-26 DIAGNOSIS — G4733 Obstructive sleep apnea (adult) (pediatric): Secondary | ICD-10-CM | POA: Diagnosis present

## 2011-07-26 DIAGNOSIS — Z9861 Coronary angioplasty status: Secondary | ICD-10-CM

## 2011-07-26 DIAGNOSIS — E785 Hyperlipidemia, unspecified: Secondary | ICD-10-CM | POA: Diagnosis present

## 2011-07-26 DIAGNOSIS — Z79899 Other long term (current) drug therapy: Secondary | ICD-10-CM

## 2011-07-26 DIAGNOSIS — I4891 Unspecified atrial fibrillation: Principal | ICD-10-CM | POA: Diagnosis present

## 2011-07-26 DIAGNOSIS — I10 Essential (primary) hypertension: Secondary | ICD-10-CM

## 2011-07-26 DIAGNOSIS — R0989 Other specified symptoms and signs involving the circulatory and respiratory systems: Secondary | ICD-10-CM

## 2011-07-26 DIAGNOSIS — I2510 Atherosclerotic heart disease of native coronary artery without angina pectoris: Secondary | ICD-10-CM | POA: Diagnosis present

## 2011-07-26 HISTORY — DX: Acute myocardial infarction, unspecified: I21.9

## 2011-07-26 LAB — BASIC METABOLIC PANEL
CO2: 27 mEq/L (ref 19–32)
Calcium: 9.5 mg/dL (ref 8.4–10.5)
Chloride: 100 mEq/L (ref 96–112)
Glucose, Bld: 145 mg/dL — ABNORMAL HIGH (ref 70–99)
Potassium: 4.2 mEq/L (ref 3.5–5.3)
Sodium: 140 mEq/L (ref 135–145)

## 2011-07-26 MED ORDER — ALPRAZOLAM 0.25 MG PO TABS: 0.2500 mg | ORAL_TABLET | Freq: Two times a day (BID) | ORAL | Status: DC | PRN

## 2011-07-26 MED ORDER — LISINOPRIL 20 MG PO TABS
20.0000 mg | ORAL_TABLET | Freq: Every evening | ORAL | Status: DC
  Administered 2011-07-26 – 2011-07-29 (×4): 20 mg via ORAL
  Filled 2011-07-26 (×5): qty 1

## 2011-07-26 MED ORDER — NIACIN 100 MG PO TABS
100.0000 mg | ORAL_TABLET | Freq: Every day | ORAL | Status: DC
  Administered 2011-07-27 – 2011-07-30 (×4): 100 mg via ORAL
  Filled 2011-07-26 (×5): qty 1

## 2011-07-26 MED ORDER — SODIUM CHLORIDE 0.9 % IJ SOLN: 3.0000 mL | INTRAMUSCULAR | Status: DC | PRN

## 2011-07-26 MED ORDER — NITROGLYCERIN 0.4 MG SL SUBL: 0.4000 mg | SUBLINGUAL_TABLET | SUBLINGUAL | Status: DC | PRN

## 2011-07-26 MED ORDER — ATORVASTATIN CALCIUM 10 MG PO TABS
10.0000 mg | ORAL_TABLET | Freq: Every day | ORAL | Status: DC
  Administered 2011-07-26 – 2011-07-29 (×4): 10 mg via ORAL
  Filled 2011-07-26 (×5): qty 1

## 2011-07-26 MED ORDER — DILTIAZEM HCL ER COATED BEADS 120 MG PO CP24
120.0000 mg | ORAL_CAPSULE | Freq: Every evening | ORAL | Status: DC
  Administered 2011-07-26 – 2011-07-29 (×4): 120 mg via ORAL
  Filled 2011-07-26 (×5): qty 1

## 2011-07-26 MED ORDER — DABIGATRAN ETEXILATE MESYLATE 150 MG PO CAPS
150.0000 mg | ORAL_CAPSULE | Freq: Two times a day (BID) | ORAL | Status: DC
  Administered 2011-07-26 – 2011-07-30 (×8): 150 mg via ORAL
  Filled 2011-07-26 (×9): qty 1

## 2011-07-26 MED ORDER — OMEGA-3-ACID ETHYL ESTERS 1 G PO CAPS
1.0000 g | ORAL_CAPSULE | Freq: Two times a day (BID) | ORAL | Status: DC
  Administered 2011-07-26 – 2011-07-30 (×8): 1 g via ORAL
  Filled 2011-07-26 (×9): qty 1

## 2011-07-26 MED ORDER — SODIUM CHLORIDE 0.9 % IV SOLN: 250.0000 mL | INTRAVENOUS | Status: DC | PRN

## 2011-07-26 MED ORDER — ACETAMINOPHEN 325 MG PO TABS: 650.0000 mg | ORAL_TABLET | ORAL | Status: DC | PRN

## 2011-07-26 MED ORDER — METOPROLOL SUCCINATE ER 25 MG PO TB24
25.0000 mg | ORAL_TABLET | Freq: Every evening | ORAL | Status: DC
  Administered 2011-07-26 – 2011-07-29 (×4): 25 mg via ORAL
  Filled 2011-07-26 (×5): qty 1

## 2011-07-26 MED ORDER — ZOLPIDEM TARTRATE 5 MG PO TABS: 10.0000 mg | ORAL_TABLET | Freq: Every evening | ORAL | Status: DC | PRN

## 2011-07-26 MED ORDER — SODIUM CHLORIDE 0.9 % IJ SOLN
3.0000 mL | Freq: Two times a day (BID) | INTRAMUSCULAR | Status: DC
  Administered 2011-07-27 – 2011-07-29 (×5): 3 mL via INTRAVENOUS

## 2011-07-26 MED ORDER — DOFETILIDE 500 MCG PO CAPS
500.0000 ug | ORAL_CAPSULE | Freq: Two times a day (BID) | ORAL | Status: DC
  Administered 2011-07-26 – 2011-07-30 (×8): 500 ug via ORAL
  Filled 2011-07-26 (×9): qty 1

## 2011-07-26 MED ORDER — SODIUM CHLORIDE 0.9 % IJ SOLN
3.0000 mL | Freq: Two times a day (BID) | INTRAMUSCULAR | Status: DC
  Administered 2011-07-26: 3 mL via INTRAVENOUS

## 2011-07-26 MED ORDER — ONDANSETRON HCL 4 MG/2ML IJ SOLN: 4.0000 mg | Freq: Four times a day (QID) | INTRAMUSCULAR | Status: DC | PRN

## 2011-07-26 NOTE — H&P (Signed)
HPI: 51 year old male with past medical history of hypertension, hyperlipidemia, coronary artery disease and atrial fibrillation admitted for tikosyn load. Patient does have a history of coronary disease and has had previous inferior myocardial infarction treated with PCI in May of 2004. He had atrial fibrillation ablation by Dr. Johney Frame in may of 2012. He subsequently had recurrent atrial fibrillation and was treated with amiodarone. However after this medication was discontinued his atrial fibrillation returned and he underwent cardioversion in March of 2013 but did not hold sinus. He now presents for tikosyn load. Last echocardiogram in April of 2013 showed normal LV function. Patient has fatigue with his atrial fibrillation and palpitations. He otherwise denies dyspnea on exertion, orthopnea, PND, pedal edema, syncope or exertional chest pain.  Medications Prior to Admission  Medication Sig Dispense Refill  . dabigatran (PRADAXA) 150 MG CAPS Take 150 mg by mouth 2 (two) times daily.      [name] diltiazem (CARDIZEM CD) 120 MG 24 hr capsule Take 1 capsule (120 mg total) by mouth daily.  30 capsule  1  . hydrocortisone cream 1 % Apply 1 application topically 3 (three) times daily as needed (skin irritation).  30 g  0  . lisinopril (PRINIVIL,ZESTRIL) 20 MG tablet Take 1 tablet (20 mg total) by mouth daily.  30 tablet  11  . metoprolol succinate (TOPROL-XL) 25 MG 24 hr tablet Take 25 mg by mouth daily.      . Multiple Vitamins-Minerals (CENTRUM PO) Take 1 tablet by mouth 2 (two) times daily.       . niacin 100 MG tablet Take 100 mg by mouth daily with breakfast.        . nitroGLYCERIN (NITROSTAT) 0.4 MG SL tablet Place 0.4 mg under the tongue every 5 (five) minutes as needed. For chest apin      . omega-3 acid ethyl esters (LOVAZA) 1 G capsule Take 2 g by mouth daily.      . rosuvastatin (CRESTOR) 10 MG tablet Take 10 mg by mouth daily.        No Known Allergies  Past Medical History  Diagnosis Date    . Atrial fibrillation     a. Persistent, s/p afib ablation by JA 08/16/10;  b. s/p failed DCCV 05/2011  . CAD (coronary artery disease)     a. Status post acute inferior wall myocardial infarction emergent bare metal stenting of the mid RCA, May 2004,  residual nonobstructive coronary artery disease Ejection fraction 60% by echocardiography 04/2007  . HTN (hypertension)   . Hyperlipidemia   . Sleep apnea     followed by Dr [name]    Past Surgical History  Procedure Date  . Cardiac catheterization 2004     Successful PTCA and stent placement in the mid right   coronary artery with an extra 99% narrowing to 0% with placement of a 3.5 x  20 mm Express 2 stent with improvement of TIMI grade 1 flow to TIMI grade 3.  . Atrial ablatio 08/16/10    afib ablation by JA  . Cardioversion 06/15/2011    Procedure: CARDIOVERSION;  Surgeon: Pricilla Riffle, MD;  Location: [facility] OR;  Service: Cardiovascular;  Laterality: N/A;    History   Social History  . Marital Status: Single    Spouse Name: N/A    Number of Children: N  . Years of Education: N/A   Occupational History  . Engineer, water    Social History Main Topics  . Smoking status: Never Smoker   .  Smokeless tobacco: Never Used  . Alcohol Use: Yes     1 drink per day  . Drug Use: No  . Sexually Active: Not on file   Other Topics Concern  . Not on file   Social History Narrative   Pt lives in [place] alone.  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 Coronary artery disease Mother     ROS:  no fevers or chills, productive cough, hemoptysis, dysphasia, odynophagia, melena, hematochezia, dysuria, hematuria, rash, seizure activity, orthopnea, PND, pedal edema, claudication. Remaining systems are negative.  Physical Exam:   Blood pressure 117/79, pulse 81, temperature 98.2 F (36.8 C), temperature source Oral, resp. rate 20, height 5\' 11"  (1.803 m), weight 94.7  kg (208 lb 12.4 oz), SpO2 95.00%.  General:  Well developed/well nourished in NAD Skin warm/dry Patient not depressed No peripheral clubbing Back-normal HEENT-normal/normal eyelids Neck supple/normal carotid upstroke bilaterally; no bruits; no JVD; no thyromegaly chest - CTA/ normal expansion CV - irregular/normal S1 and S2; no murmurs, rubs or gallops;  PMI nondisplaced Abdomen -NT/ND, no HSM, no mass, + bowel sounds, no bruit 2+ femoral pulses, no bruits Ext-no edema, chords, 2+ DP Neuro-grossly nonfocal  ECG atrial fibrillation with PVCs or aberrantly conducted beats.   Results for orders placed in visit on 07/26/11 (from the past 48 hour(s))  BASIC METABOLIC PANEL     Status: Abnormal   Collection Time   07/26/11  9:33 AM      Component Value Range Comment   Sodium 140  135 - 145 (mEq/L)    Potassium 4.2  3.5 - 5.3 (mEq/L)    Chloride 100  96 - 112 (mEq/L)    CO2 27  19 - 32 (mEq/L)    Glucose, Bld 145 (*) 70 - 99 (mg/dL)    BUN 16  6 - 23 (mg/dL)    Creat 4.54  0.98 - 1.35 (mg/dL)    Calcium 9.5  8.4 - 10.5 (mg/dL)   MAGNESIUM     Status: Normal   Collection Time   07/26/11  9:33 AM      Component Value Range Comment   Magnesium 1.8  1.5 - 2.5 (mg/dL)      Assessment/Plan #1 atrial fibrillation-the patient is symptomatic with his atrial fibrillation predominantly complaining of fatigue. He has had previous ablation but did not hold sinus rhythm. His electrolytes are reviewed and his potassium is 4.2 and magnesium 1.8. Begin tikosyn; follow telemetry, electrolytes and QTC. He will need daily electrocardiograms. Continue Pradaxa and note he has been on this for approximately one year and he reports compliance. If patient does not convert on his own we will plan cardioversion once tikosyn fully loaded. If he has recurrent atrial fibrillation despite this antiarrhythmic and we will most likely consider repeat ablation. Continue beta blocker and Cardizem. #2-CAD-continue statin.  Not on given need for Pradaxa. #3-hypertension-monitor blood pressure and adjust as needed. #4-hyperlipidemia-continue statin.  Olga Millers MD 07/26/2011, 5:15 PM

## 2011-07-26 NOTE — Assessment & Plan Note (Signed)
Reviewed pt's labwork.  K 4.2 (goal>4.0), Mg- 1.8 (goal>1.8), SCr- 1.01.  CrCl>178mL/min.  No INR required since pt on Pradaxa.  Pt's labs appropriate to start Tikosyn therapy.  Will initiate at BID and adjust based on QTc as needed.  Pt aware to go to hospital for admission.

## 2011-07-26 NOTE — Progress Notes (Signed)
HPI The patient presents today for Tikoysn initiation.  He was recently seen by Dr. Johney Frame.  He had an ablation in May 2012 but unfortunately has had episodes of atrial fibrillation since then.  He attempted DCCV but only remained in sinus rhythm for a short time.  Dr. Johney Frame suggested antiarrhythmic therapy or ablation.  The patient preferred trying Tikosyn at this time.   Reviewed process for Tikosyn initiation with patient.  We discussed potential side effects and importance of compliance with this medication.  Reviewed medication list.  He is currently not taking any QTc prolongating medications or contraindicated medications.  He stopped his HCTZ at the end of April.  He reports compliance with his anticoagulation therapy.  Will check labs today in anticipation of admission.   EKG: Atrial fibrillation with HR of 67 and QTc-  Current Outpatient Prescriptions on File Prior to Visit  Medication Sig Dispense Refill  . dabigatran (PRADAXA) 150 MG CAPS Take 150 mg by mouth 2 (two) times daily.      [name] diltiazem (CARDIZEM CD) 120 MG 24 hr capsule Take 1 capsule (120 mg total) by mouth daily.  30 capsule  1  . hydrocortisone cream 1 % Apply 1 application topically 3 (three) times daily as needed (skin irritation).  30 g  0  . lisinopril (PRINIVIL,ZESTRIL) 20 MG tablet Take 1 tablet (20 mg total) by mouth daily.  30 tablet  11  . metoprolol succinate (TOPROL-XL) 25 MG 24 hr tablet Take 25 mg by mouth daily.      . Multiple Vitamins-Minerals (CENTRUM PO) Take 1 tablet by mouth 2 (two) times daily.       . niacin 100 MG tablet Take 100 mg by mouth daily with breakfast.        . nitroGLYCERIN (NITROSTAT) 0.4 MG SL tablet Place 0.4 mg under the tongue every 5 (five) minutes as needed. For chest apin      . omega-3 acid ethyl esters (LOVAZA) 1 G capsule Take 2 g by mouth daily.      [name] DISCONTD: diltiazem (CARDIZEM CD) 120 MG 24 hr capsule Take 120 mg by mouth daily.        No Known Allergies

## 2011-07-27 ENCOUNTER — Other Ambulatory Visit: Payer: Self-pay

## 2011-07-27 LAB — BASIC METABOLIC PANEL
CO2: 24 mEq/L (ref 19–32)
Calcium: 9.1 mg/dL (ref 8.4–10.5)
Chloride: 103 mEq/L (ref 96–112)
Glucose, Bld: 101 mg/dL — ABNORMAL HIGH (ref 70–99)
Potassium: 4 mEq/L (ref 3.5–5.1)
Sodium: 139 mEq/L (ref 135–145)

## 2011-07-27 NOTE — Progress Notes (Signed)
07/27/2011 [facility], Bosie Clos SPARKS Case Management Note [phone]    CARE MANAGEMENT NOTE 07/27/2011  Patient:  CARTRELL, BENTSEN   Account Number:  [id number]  Date Initiated:  07/27/2011  Documentation initiated by:  Fransico Michael  Subjective/Objective Assessment:   admitted on 07/26/11 with atrial fib.     Action/Plan:   prior to admission, patient lived at home alone and was independent with ADLs.   Anticipated DC Date:  07/30/2011   Anticipated DC Plan:  HOME/SELF CARE      DC Planning Services  CM consult      Choice offered to / List presented to:             Status of service:  In process, will continue to follow Medicare Important Message given?   (If response is "NO", the following Medicare IM given date fields will be blank) Date Medicare IM given:   Date Additional Medicare IM given:    Discharge Disposition:    Per UR Regulation:  Reviewed for med. necessity/level of care/duration of stay  If discussed at Long Length of Stay Meetings, dates discussed:    Comments:  07/27/11 1510-J.Theola Cuellar,RN,BSN [phone]      Called [facility] at [phone] regarding Tikosyn. This pharmacy does not carry Tikosyn. Called [facility] at the corner of [address] and [address] in [place] at [phone]. Tikosyn is available at this pharmacy. Patient notified of need to fill prescription at [facility]. Information placed on discharge instructions. Sticky note left on chart for MD to write 7 day prescription for Tikosyn to be filled in main pharmacy at [facility] using the Tikosyn fund prior to discharge. CM will continue to follow.  07/27/11-1439-J.Kaela Beitz,RN,BSN [phone]      Noted initiation of Tikosyn. In to speak to patient regarding medication. [facility] [address] in [place] for prescriptions. Benefits check entered for copay information. No other home needs identified at present. CM will continue to monitor.

## 2011-07-27 NOTE — Progress Notes (Signed)
Pts. Heart rate down to 39 not sustained.  Pt asleep and asymptomatic.  HR sustaining a-fib in 50s.  Dr. Mayford Knife made aware as per ordered.  Will continue to monitor patient.

## 2011-07-27 NOTE — Progress Notes (Signed)
     Patient: Ottie Tillery Chizmar Date of Encounter: 07/27/2011, 1:09 PM Admit date: 07/26/2011     Subjective  Mr. Gorton reports "irregular" palpitations which are not new for him. He denies chest pain, shortness of breath, dizziness/near-syncope or syncope.    Objective   Filed Vitals:   07/27/11 0930  BP: 118/68  Pulse: 65  Temp: 97.9 F (36.6 C)  Resp: 20     Intake/Output Summary (Last 24 hours) at 07/27/11 1309 Last data filed at 07/27/11 1000  Gross per 24 hour  Intake    483 ml  Output   1600 ml  Net  -1117 ml    Physical Exam: General: Well developed, well appearing 51 year old male, in no acute distress. Head: Normocephalic, atraumatic, sclera non-icteric, no xanthomas, nares are without discharge.  Neck: Supple. JVD not elevated. Lungs: Clear bilaterally to auscultation without wheezes, rales, or rhonchi. Breathing is unlabored. Heart: Irregularly irregular S1 S2 without murmurs, rubs, or gallops.  Abdomen: Soft, non-distended. Extremities: No clubbing or cyanosis. No edema.  Distal pedal pulses are 2+ and equal bilaterally. Neuro: Alert and oriented X 3. Moves all extremities spontaneously. No focal deficits. Psych:  Responds to questions appropriately with a normal affect.  Inpatient Medications:  . atorvastatin  10 mg Oral q1800  . dabigatran  150 mg Oral BID  . diltiazem  120 mg Oral QPM  . dofetilide  500 mcg Oral Q12H  . lisinopril  20 mg Oral QPM  . metoprolol succinate  25 mg Oral QPM  . niacin  100 mg Oral Q breakfast  . omega-3 acid ethyl esters  1 g Oral BID   Labs:  [facility] 07/27/11 0635 07/26/11 0933  NA 139 140  K 4.0 4.2  CL 103 100  CO2 24 27  GLUCOSE 101* 145*  BUN 12 16  CREATININE 0.98 1.01  CALCIUM 9.1 9.5  MG 1.9 1.8  PHOS -- --    12-lead ECG: atrial fibrillation at 65 bpm with PVCs; QT/QTc 450/468 ms. Telemetry: currrently shows rate controlled atrial fibrillation; occasional PVCs   Assessment and Plan  1.   Paroxysmal atrial fibrillation - symptomatic with fatigue and exertional dyspnea; s/p atrial fibrillation ablation by Dr. Johney Frame May 2012 and was treated with amiodarone short term; however, after this was discontinued his atrial fibrillation returned and he underwent cardioversion in March 2013 but did not maintain sinus rhythm; now admitted for Tikosyn drug loading, currently on 500 mcg PO every 12 hours; will continue Tikosyn drug load per protocol  2.  Normal LV function 3.  CAD - stable with no anginal symptoms; continue medical therapy  Signed, EDMISTEN, BROOKE O. PA-C  EP Attending  Patient seen and examined. Agree with above exam, assess and plan. Will likely be ready for discharge Sunday P.m. Or Monday a.m.  Lewayne Bunting, M.D.

## 2011-07-28 NOTE — Progress Notes (Signed)
     Patient: Scott White Date of Encounter: 07/28/2011, 10:14 AM Admit date: 07/26/2011     Subjective  Tolerating tikosyn load so far. QTc this am . He denies chest pain, shortness of breath, dizziness/near-syncope or syncope.    Objective   Filed Vitals:   07/28/11 0636  BP: 112/70  Pulse: 53  Temp: 97.3 F (36.3 C)  Resp: 16     Intake/Output Summary (Last 24 hours) at 07/28/11 1014 Last data filed at 07/28/11 0865  Gross per 24 hour  Intake    603 ml  Output   1780 ml  Net  -1177 ml    Physical Exam: General: Well developed, well appearing 51 year old male, in no acute distress. Head: Normocephalic, atraumatic, sclera non-icteric, no xanthomas, nares are without discharge.  Neck: Supple. JVD not elevated. Lungs: Clear bilaterally to auscultation without wheezes, rales, or rhonchi. Breathing is unlabored. Heart: Irregularly irregular S1 S2 without murmurs, rubs, or gallops.  Abdomen: Soft, non-distended. Extremities: No clubbing or cyanosis. No edema.  Distal pedal pulses are 2+ and equal bilaterally. Neuro: Alert and oriented X 3. Moves all extremities spontaneously. No focal deficits. Psych:  Responds to questions appropriately with a normal affect.  Inpatient Medications:  . atorvastatin  10 mg Oral q1800  . dabigatran  150 mg Oral BID  . diltiazem  120 mg Oral QPM  . dofetilide  500 mcg Oral Q12H  . lisinopril  20 mg Oral QPM  . metoprolol succinate  25 mg Oral QPM  . niacin  100 mg Oral Q breakfast  . omega-3 acid ethyl esters  1 g Oral BID   Labs:  [facility] 07/27/11 0635 07/26/11 0933  NA 139 140  K 4.0 4.2  CL 103 100  CO2 24 27  GLUCOSE 101* 145*  BUN 12 16  CREATININE 0.98 1.01  CALCIUM 9.1 9.5  MG 1.9 1.8  PHOS -- --    12-lead ECG: atrial fib with VR 57. QT/QTc 446/434 Telemetry: currrently shows rate controlled atrial fibrillation; occasional PVCs   Assessment and Plan  1.  Paroxysmal atrial fibrillation - symptomatic with  fatigue and exertional dyspnea; s/p atrial fibrillation ablation by Dr. Johney Frame May 2012 and was treated with amiodarone short term; however, after this was discontinued his atrial fibrillation returned and he underwent cardioversion in March 2013 but did not maintain sinus rhythm; now admitted for Tikosyn drug loading, currently on 500 mcg PO every 12 hours; will continue Tikosyn drug load per protocol. Discharge Sunday night or if cardioversion needed Monday am.  2.  Normal LV function 3.  CAD - stable with no anginal symptoms; continue medical therapy  Signed, Cassell Clement       Lewayne Bunting, M.D.

## 2011-07-28 NOTE — Progress Notes (Signed)
EKG obtained as ordered after 3rd dose of Tikosyn given.  QRS complexes vary from 0.1-0.16  QTc interval is 0.508 (baseline was 0.427).  Will pass on to next nurse to obtain EKG to calculate QRS and QTc before giving next dose.  Will continue to monitor patient.

## 2011-07-29 ENCOUNTER — Inpatient Hospital Stay (HOSPITAL_COMMUNITY): Payer: BC Managed Care – PPO

## 2011-07-29 LAB — DIFFERENTIAL
Basophils Relative: 0 % (ref 0–1)
Eosinophils Absolute: 0.1 10*3/uL (ref 0.0–0.7)
Eosinophils Relative: 1 % (ref 0–5)
Neutrophils Relative %: 59 % (ref 43–77)

## 2011-07-29 LAB — CBC
MCH: 30.2 pg (ref 26.0–34.0)
MCHC: 34.8 g/dL (ref 30.0–36.0)
Platelets: 168 10*3/uL (ref 150–400)

## 2011-07-29 LAB — BASIC METABOLIC PANEL
BUN: 12 mg/dL (ref 6–23)
BUN: 13 mg/dL (ref 6–23)
Calcium: 9.6 mg/dL (ref 8.4–10.5)
Calcium: 9.8 mg/dL (ref 8.4–10.5)
Creatinine, Ser: 1.03 mg/dL (ref 0.50–1.35)
GFR calc Af Amer: >90 mL/min (ref >90)
GFR calc Af Amer: >90 mL/min (ref >90)
GFR calc non Af Amer: 82 mL/min — ABNORMAL LOW (ref >90)
GFR calc non Af Amer: 84 mL/min — ABNORMAL LOW (ref >90)
Potassium: 4.3 mEq/L (ref 3.5–5.1)
Sodium: 139 mEq/L (ref 135–145)

## 2011-07-29 LAB — PROTIME-INR: INR: 1.21 (ref 0.00–1.49)

## 2011-07-29 LAB — APTT: aPTT: 35 seconds (ref 24–37)

## 2011-07-29 MED ORDER — HYDROCORTISONE 1 % EX CREA
1.0000 "application " | TOPICAL_CREAM | Freq: Three times a day (TID) | CUTANEOUS | Status: DC | PRN
  Filled 2011-07-29: qty 28

## 2011-07-29 MED ORDER — SODIUM CHLORIDE 0.9 % IV SOLN: 250.0000 mL | INTRAVENOUS | Status: DC

## 2011-07-29 MED ORDER — SODIUM CHLORIDE 0.9 % IJ SOLN: 3.0000 mL | Freq: Two times a day (BID) | INTRAMUSCULAR | Status: DC

## 2011-07-29 MED ORDER — SODIUM CHLORIDE 0.45 % IV SOLN
75.0000 mL/h | INTRAVENOUS | Status: DC
  Administered 2011-07-30: 75 mL/h via INTRAVENOUS

## 2011-07-29 MED ORDER — SODIUM CHLORIDE 0.9 % IJ SOLN: 3.0000 mL | INTRAMUSCULAR | Status: DC | PRN

## 2011-07-29 NOTE — Progress Notes (Signed)
     Patient: Scott White Date of Encounter: 07/29/2011, 10:02 AM Admit date: 07/26/2011     Subjective  Tolerating tikosyn load so far.  He denies chest pain, shortness of breath, dizziness/near-syncope or syncope.    Objective   Filed Vitals:   07/29/11 0452  BP: 104/66  Pulse: 70  Temp: 97.9 F (36.6 C)  Resp: 20     Intake/Output Summary (Last 24 hours) at 07/29/11 1002 Last data filed at 07/29/11 0837  Gross per 24 hour  Intake    963 ml  Output   1550 ml  Net   -587 ml    Physical Exam: General: Well developed, well appearing 51 year old male, in no acute distress. Head: Normocephalic, atraumatic, sclera non-icteric, no xanthomas, nares are without discharge.  Neck: Supple. JVD not elevated. Lungs: Clear bilaterally to auscultation without wheezes, rales, or rhonchi. Breathing is unlabored. Heart: Irregularly irregular S1 S2 without murmurs, rubs, or gallops.  Abdomen: Soft, non-distended. Extremities: No clubbing or cyanosis. No edema.  Distal pedal pulses are 2+ and equal bilaterally. Neuro: Alert and oriented X 3. Moves all extremities spontaneously. No focal deficits. Psych:  Responds to questions appropriately with a normal affect.  Inpatient Medications:  . atorvastatin  10 mg Oral q1800  . dabigatran  150 mg Oral BID  . diltiazem  120 mg Oral QPM  . dofetilide  500 mcg Oral Q12H  . lisinopril  20 mg Oral QPM  . metoprolol succinate  25 mg Oral QPM  . niacin  100 mg Oral Q breakfast  . omega-3 acid ethyl esters  1 g Oral BID   Labs:  [facility] 07/29/11 0830 07/27/11 0635  NA 140 139  K 4.5 4.0  CL 101 103  CO2 27 24  GLUCOSE 142* 101*  BUN 12 12  CREATININE 1.03 0.98  CALCIUM 9.6 9.1  MG 1.9 1.9  PHOS -- --    12-lead ECG: Atrial fib with slow ventricular response. QT/QTc 478/457 this am Telemetry: currrently shows rate controlled atrial fibrillation; occasional PVCs   Assessment and Plan  1.  Paroxysmal atrial fibrillation -  symptomatic with fatigue and exertional dyspnea; s/p atrial fibrillation ablation by Dr. Johney Frame May 2012 and was treated with amiodarone short term; however, after this was discontinued his atrial fibrillation returned and he underwent cardioversion in March 2013 but did not maintain sinus rhythm; now admitted for Tikosyn drug loading, currently on 500 mcg PO every 12 hours; will continue Tikosyn drug load per protocol. Unless he converts during the day today we will plan on DCCV  Monday am prior to DC home. 2.  Normal LV function 3.  CAD - stable with no anginal symptoms; continue medical therapy  Signed, Cassell Clement

## 2011-07-30 ENCOUNTER — Encounter (HOSPITAL_COMMUNITY)
Admission: AD | Disposition: A | Discharge status: Home / Self Care | Payer: Self-pay | Source: Ambulatory Visit | Attending: Internal Medicine

## 2011-07-30 DIAGNOSIS — I2510 Atherosclerotic heart disease of native coronary artery without angina pectoris: Secondary | ICD-10-CM

## 2011-07-30 DIAGNOSIS — I10 Essential (primary) hypertension: Secondary | ICD-10-CM

## 2011-07-30 SURGERY — CARDIOVERSION
Anesthesia: Monitor Anesthesia Care

## 2011-07-30 MED ORDER — DOFETILIDE 500 MCG PO CAPS: 500.0000 ug | ORAL_CAPSULE | Freq: Two times a day (BID) | ORAL | Status: DC

## 2011-07-30 NOTE — Progress Notes (Signed)
D/c instructions given to pt regarding f/u care and appointments, home medications, s/s of when to notify MD and diet/activity restrictions.  7 day supply of Tikosyn given to pt to take home.  Pt verbalized understanding with all questions answered and pt acknowledged receipt.  IV and telemetry removed from pt.  Pt states he will walk out, no need for wheelchair assistance.

## 2011-07-30 NOTE — Progress Notes (Signed)
Notified by monitor tech that pt. Converted to NSR.  EKG obtained which confirmed patient in NSR with PVCs.  Theodore Demark, PA made aware.  Orders to d/c cardioversion and re-start diet obtained.  Will continue to monitor patient.

## 2011-07-30 NOTE — Progress Notes (Signed)
     Patient: Scott White Date of Encounter: 07/30/2011, 7:55 AM Admit date: 07/26/2011     Subjective  No events, back in sinus now.  He denies chest pain, shortness of breath, dizziness/near-syncope or syncope.    Objective   Filed Vitals:   07/30/11 0406  BP: 109/56  Pulse: 72  Temp: 97.8 F (36.6 C)  Resp: 16     Intake/Output Summary (Last 24 hours) at 07/30/11 0755 Last data filed at 07/30/11 9604  Gross per 24 hour  Intake   1303 ml  Output   1925 ml  Net   -622 ml    Physical Exam: General: Well developed, well appearing 51 year old male, in no acute distress. Head: Normocephalic, atraumatic, sclera non-icteric, no xanthomas, nares are without discharge.  Neck: Supple. JVD not elevated. Lungs: Clear bilaterally to auscultation without wheezes, rales, or rhonchi. Breathing is unlabored. Heart: regularly irregular S1 S2 without murmurs, rubs, or gallops.  Abdomen: Soft, non-distended. Extremities: No clubbing or cyanosis. No edema.  Distal pedal pulses are 2+ and equal bilaterally. Neuro: Alert and oriented X 3. Moves all extremities spontaneously. No focal deficits. Psych:  Responds to questions appropriately with a normal affect.  Inpatient Medications:  . atorvastatin  10 mg Oral q1800  . dabigatran  150 mg Oral BID  . diltiazem  120 mg Oral QPM  . dofetilide  500 mcg Oral Q12H  . lisinopril  20 mg Oral QPM  . metoprolol succinate  25 mg Oral QPM  . niacin  100 mg Oral Q breakfast  . omega-3 acid ethyl esters  1 g Oral BID   Labs:  Basename 07/29/11 1120 07/29/11 0830  NA 139 140  K 4.3 4.5  CL 101 101  CO2 30 27  GLUCOSE 109* 142*  BUN 13 12  CREATININE 1.01 1.03  CALCIUM 9.8 9.6  MG 2.3 1.9  PHOS -- --    Telemetry: currrently shows sinus rhythm with occasional PVCs   Assessment and Plan  1.  Paroxysmal atrial fibrillation - Continue tikosyn and pradaxa 2.  Normal LV function 3.  CAD - stable with no anginal symptoms; continue  medical therapy  DC to home today BMET, Mg, EKG in 1 week Follow-up with me in 4 weeks.  Signed, Hillis Range

## 2011-07-30 NOTE — Discharge Summary (Signed)
ELECTROPHYSIOLOGY DISCHARGE SUMMARY    Patient ID: Scott White,  MRN: [id number], DOB/AGE: 1960-04-28 51 y.o.  Admit date: 07/26/2011 Discharge date: 07/30/2011  Primary Care Physician: Shade Flood, MD, MD Primary Cardiologist: Hillis Range, MD  Primary Discharge Diagnosis:  1.  Atrial fibrillation s/p Tikosyn drug load  Secondary Discharge Diagnosis:  1.  CAD 2.  HTN 3.  Dyslipidemia 4.  Obstructive sleep apnea  Procedures This Admission: None  History and Hospital Course: Please see admission H&P for full details. Briefly, Scott White is a 51 year old gentleman with a history of PAF, CAD, normal LV function, HTN and dyslipidemia who is s/p atrial fibrillation ablation by Dr. Johney Frame May 2012 and was treated with amiodarone short term; however, after this was discontinued his atrial fibrillation returned and he underwent cardioversion in March 2013 but did not maintain sinus rhythm. He is symptomatic with fatigue and exertional dyspnea. He was admitted on 07/26/2011 for Tikosyn drug loading. He is currently on 500 mcg PO every 12 hours. His potassium, serum creatinine and QT interval remain within normal range. He has now converted to sinus rhythm this AM. His telemetry shows normal sinus rhythm with occasional PVCs. He has been seen, examined and deemed stable for discharge to home today by Dr. Hillis Range.  Discharge Vitals: Blood pressure 109/56, pulse 72, temperature 97.8 F (36.6 C), temperature source Oral, resp. rate 16, height 5\' 11"  (1.803 m), weight 207 lb 14.4 oz (94.303 kg), SpO2 99.00%.   Labs: Lab Results  Component Value Date   WBC 6.6 07/29/2011   HGB 16.6 07/29/2011   HCT 47.7 07/29/2011   MCV 86.9 07/29/2011   PLT 168 07/29/2011     Lab 07/29/11 1120  NA 139  K 4.3  CL 101  CO2 30  BUN 13  CREATININE 1.01  CALCIUM 9.8  PROT --  BILITOT --  ALKPHOS --  ALT --  AST --  GLUCOSE 109*    Discharge Medications:  Medication List  As of 07/30/2011   7:55 AM   STOP taking these medications     diltiazem 120 MG 24 hr capsule       TAKE these medications     Centrum multivitamin   Take 1 tablet by mouth 2 (two) times daily.      dabigatran 150 MG Caps   Commonly known as: PRADAXA   Take 150 mg by mouth 2 (two) times daily.      dofetilide 500 MCG capsule   Commonly known as: TIKOSYN   Take 1 capsule (500 mcg total) by mouth every 12 (twelve) hours.      lisinopril 20 MG tablet   Commonly known as: PRINIVIL,ZESTRIL   Take 20 mg by mouth every evening.      metoprolol succinate 25 MG 24 hr tablet   Commonly known as: TOPROL-XL   Take 25 mg by mouth every evening.      niacin 100 MG tablet   Take 100 mg by mouth daily with breakfast.      nitroGLYCERIN 0.4 MG SL tablet   Commonly known as: NITROSTAT   Place 0.4 mg under the tongue every 5 (five) minutes as needed. For chest pain.      omega-3 acid ethyl esters 1 G capsule   Commonly known as: LOVAZA   Take 1 g by mouth 2 (two) times daily.      rosuvastatin 10 MG tablet   Commonly known as: CRESTOR   Take 10 mg  by mouth every evening.      Disposition:  Scott White is being discharged in improved, stable condition.  Follow-up: 1.  [facility] in 1 week for basic metabolic panel, Mg and 12-lead ECG - Monday, May 20th at 10:00 AM 2.  Dr. Hillis Range at [facility] in 4 weeks - Wed, June 12th at 12:45 PM  Follow-up Information    Follow up with [facility] pharmacy. (Please take your prescription for Tikosyn to be filled at [facility] as soon after discharge as possible . They may have to order it .)    Contact information:   [facility] [place] [phone]   Duration of Discharge Encounter: Greater than 30 minutes including physician time.  Signed, Minda Meo., PA-C 07/30/2011, 7:34 AM   I have seen, examined the patient, and reviewed the above assessment and plan.   Co Sign: Hillis Range, MD 07/30/2011 5:26 PM

## 2011-08-01 NOTE — Progress Notes (Signed)
Utilization review completed.  

## 2011-08-06 ENCOUNTER — Encounter: Payer: Self-pay | Admitting: *Deleted

## 2011-08-06 ENCOUNTER — Ambulatory Visit (INDEPENDENT_AMBULATORY_CARE_PROVIDER_SITE_OTHER): Payer: BC Managed Care – PPO | Admitting: *Deleted

## 2011-08-06 VITALS — BP 128/88 | HR 60 | Resp 18

## 2011-08-06 DIAGNOSIS — I4891 Unspecified atrial fibrillation: Secondary | ICD-10-CM

## 2011-08-06 DIAGNOSIS — E781 Pure hyperglyceridemia: Secondary | ICD-10-CM

## 2011-08-06 DIAGNOSIS — I10 Essential (primary) hypertension: Secondary | ICD-10-CM

## 2011-08-06 DIAGNOSIS — E785 Hyperlipidemia, unspecified: Secondary | ICD-10-CM

## 2011-08-06 LAB — BASIC METABOLIC PANEL
Calcium: 9.1 mg/dL (ref 8.4–10.5)
Creatinine, Ser: 0.9 mg/dL (ref 0.4–1.5)
GFR: 93.33 mL/min (ref >60.00)
Glucose, Bld: 113 mg/dL — ABNORMAL HIGH (ref 70–99)
Sodium: 141 mEq/L (ref 135–145)

## 2011-08-06 NOTE — Progress Notes (Signed)
Patient in the office today for a s/p tikosyn initiation on 07/26/11. He denies any cardiac complaints. EKG reviewed by Dr. Johney Frame.

## 2011-08-29 ENCOUNTER — Ambulatory Visit: Payer: BC Managed Care – PPO | Admitting: Internal Medicine

## 2011-08-29 ENCOUNTER — Other Ambulatory Visit: Payer: Self-pay | Admitting: Internal Medicine

## 2011-08-29 MED ORDER — DABIGATRAN ETEXILATE MESYLATE 150 MG PO CAPS: 150.0000 mg | ORAL_CAPSULE | Freq: Two times a day (BID) | ORAL | Status: DC

## 2011-08-31 ENCOUNTER — Other Ambulatory Visit: Payer: Self-pay | Admitting: Family Medicine

## 2011-09-07 ENCOUNTER — Encounter: Payer: Self-pay | Admitting: Family Medicine

## 2011-09-07 ENCOUNTER — Ambulatory Visit (INDEPENDENT_AMBULATORY_CARE_PROVIDER_SITE_OTHER): Payer: BC Managed Care – PPO | Admitting: Family Medicine

## 2011-09-07 VITALS — BP 118/71 | HR 78 | Temp 97.8°F | Resp 16 | Ht 71.0 in | Wt 208.0 lb

## 2011-09-07 DIAGNOSIS — E785 Hyperlipidemia, unspecified: Secondary | ICD-10-CM

## 2011-09-07 LAB — LIPID PANEL
LDL Cholesterol: 104 mg/dL — ABNORMAL HIGH (ref 0–99)
Triglycerides: 160 mg/dL — ABNORMAL HIGH (ref <150)

## 2011-09-07 LAB — GLUCOSE, POCT (MANUAL RESULT ENTRY): POC Glucose: 113 mg/dl — AB (ref 70–99)

## 2011-09-07 MED ORDER — ROSUVASTATIN CALCIUM 10 MG PO TABS: 10.0000 mg | ORAL_TABLET | Freq: Every day | ORAL | Status: DC

## 2011-09-07 NOTE — Patient Instructions (Signed)
Hypertriglyceridemia  Diet for High blood levels of Triglycerides Most fats in food are triglycerides. Triglycerides in your blood are stored as fat in your body. High levels of triglycerides in your blood may put you at a greater risk for heart disease and stroke.  Normal triglyceride levels are less than 150 mg/dL. Borderline high levels are 150-199 mg/dl. High levels are 200 - 499 mg/dL, and very high triglyceride levels are greater than 500 mg/dL. The decision to treat high triglycerides is generally based on the level. For people with borderline or high triglyceride levels, treatment includes weight loss and exercise. Drugs are recommended for people with very high triglyceride levels. Many people who need treatment for high triglyceride levels have metabolic syndrome. This syndrome is a collection of disorders that often include: insulin resistance, high blood pressure, blood clotting problems, high cholesterol and triglycerides. TESTING PROCEDURE FOR TRIGLYCERIDES  You should not eat 4 hours before getting your triglycerides measured. The normal range of triglycerides is between 10 and 250 milligrams per deciliter (mg/dl). Some people may have extreme levels (1000 or above), but your triglyceride level may be too high if it is above 150 mg/dl, depending on what other risk factors you have for heart disease.   People with high blood triglycerides may also have high blood cholesterol levels. If you have high blood cholesterol as well as high blood triglycerides, your risk for heart disease is probably greater than if you only had high triglycerides. High blood cholesterol is one of the main risk factors for heart disease.  CHANGING YOUR DIET  Your weight can affect your blood triglyceride level. If you are more than 20% above your ideal body weight, you may be able to lower your blood triglycerides by losing weight. Eating less and exercising regularly is the best way to combat this. Fat provides  more calories than any other food. The best way to lose weight is to eat less fat. Only 30% of your total calories should come from fat. Less than 7% of your diet should come from saturated fat. A diet low in fat and saturated fat is the same as a diet to decrease blood cholesterol. By eating a diet lower in fat, you may lose weight, lower your blood cholesterol, and lower your blood triglyceride level.  Eating a diet low in fat, especially saturated fat, may also help you lower your blood triglyceride level. Ask your dietitian to help you figure how much fat you can eat based on the number of calories your caregiver has prescribed for you.  Exercise, in addition to helping with weight loss may also help lower triglyceride levels.   Alcohol can increase blood triglycerides. You may need to stop drinking alcoholic beverages.   Too much carbohydrate in your diet may also increase your blood triglycerides. Some complex carbohydrates are necessary in your diet. These may include bread, rice, potatoes, other starchy vegetables and cereals.   Reduce "simple" carbohydrates. These may include pure sugars, candy, honey, and jelly without losing other nutrients. If you have the kind of high blood triglycerides that is affected by the amount of carbohydrates in your diet, you will need to eat less sugar and less high-sugar foods. Your caregiver can help you with this.   Adding 2-4 grams of fish oil (EPA+ DHA) may also help lower triglycerides. Speak with your caregiver before adding any supplements to your regimen.  Following the Diet  Maintain your ideal weight. Your caregivers can help you with a diet. Generally,   eating less food and getting more exercise will help you lose weight. Joining a weight control group may also help. Ask your caregivers for a good weight control group in your area.  Eat low-fat foods instead of high-fat foods. This can help you lose weight too.  These foods are lower in fat. Eat MORE  of these:   Dried beans, peas, and lentils.   Egg whites.   Low-fat cottage cheese.   Fish.   Lean cuts of meat, such as round, sirloin, rump, and flank (cut extra fat off meat you fix).   Whole grain breads, cereals and pasta.   Skim and nonfat dry milk.   Low-fat yogurt.   Poultry without the skin.   Cheese made with skim or part-skim milk, such as mozzarella, parmesan, farmers', ricotta, or pot cheese.  These are higher fat foods. Eat LESS of these:   Whole milk and foods made from whole milk, such as American, blue, cheddar, monterey jack, and swiss cheese   High-fat meats, such as luncheon meats, sausages, knockwurst, bratwurst, hot dogs, ribs, corned beef, ground pork, and regular ground beef.   Fried foods.  Limit saturated fats in your diet. Substituting unsaturated fat for saturated fat may decrease your blood triglyceride level. You will need to read package labels to know which products contain saturated fats.  These foods are high in saturated fat. Eat LESS of these:   Fried pork skins.   Whole milk.   Skin and fat from poultry.   Palm oil.   Butter.   Shortening.   Cream cheese.   Bacon.   Margarines and baked goods made from listed oils.   Vegetable shortenings.   Chitterlings.   Fat from meats.   Coconut oil.   Palm kernel oil.   Lard.   Cream.   Sour cream.   Fatback.   Coffee whiteners and non-dairy creamers made with these oils.   Cheese made from whole milk.  Use unsaturated fats (both polyunsaturated and monounsaturated) moderately. Remember, even though unsaturated fats are better than saturated fats; you still want a diet low in total fat.  These foods are high in unsaturated fat:   Canola oil.   Sunflower oil.   Mayonnaise.   Almonds.   Peanuts.   Pine nuts.   Margarines made with these oils.   Safflower oil.   Olive oil.   Avocados.   Cashews.   Peanut butter.   Sunflower seeds.   Soybean oil.     Peanut oil.   Olives.   Pecans.   Walnuts.   Pumpkin seeds.  Avoid sugar and other high-sugar foods. This will decrease carbohydrates without decreasing other nutrients. Sugar in your food goes rapidly to your blood. When there is excess sugar in your blood, your liver may use it to make more triglycerides. Sugar also contains calories without other important nutrients.  Eat LESS of these:   Sugar, brown sugar, powdered sugar, jam, jelly, preserves, honey, syrup, molasses, pies, candy, cakes, cookies, frosting, pastries, colas, soft drinks, punches, fruit drinks, and regular gelatin.   Avoid alcohol. Alcohol, even more than sugar, may increase blood triglycerides. In addition, alcohol is high in calories and low in nutrients. Ask for sparkling water, or a diet soft drink instead of an alcoholic beverage.  Suggestions for planning and preparing meals   Bake, broil, grill or roast meats instead of frying.   Remove fat from meats and skin from poultry before cooking.   Add spices,   herbs, lemon juice or vinegar to vegetables instead of salt, rich sauces or gravies.   Use a non-stick skillet without fat or use no-stick sprays.   Cool and refrigerate stews and broth. Then remove the hardened fat floating on the surface before serving.   Refrigerate meat drippings and skim off fat to make low-fat gravies.   Serve more fish.   Use less butter, margarine and other high-fat spreads on bread or vegetables.   Use skim or reconstituted non-fat dry milk for cooking.   Cook with low-fat cheeses.   Substitute low-fat yogurt or cottage cheese for all or part of the sour cream in recipes for sauces, dips or congealed salads.   Use half yogurt/half mayonnaise in salad recipes.   Substitute evaporated skim milk for cream. Evaporated skim milk or reconstituted non-fat dry milk can be whipped and substituted for whipped cream in certain recipes.   Choose fresh fruits for dessert instead of  high-fat foods such as pies or cakes. Fruits are naturally low in fat.  When Dining Out   Order low-fat appetizers such as fruit or vegetable juice, pasta with vegetables or tomato sauce.   Select clear, rather than cream soups.   Ask that dressings and gravies be served on the side. Then use less of them.   Order foods that are baked, broiled, poached, steamed, stir-fried, or roasted.   Ask for margarine instead of butter, and use only a small amount.   Drink sparkling water, unsweetened tea or coffee, or diet soft drinks instead of alcohol or other sweet beverages.  QUESTIONS AND ANSWERS ABOUT OTHER FATS IN THE BLOOD: SATURATED FAT, TRANS FAT, AND CHOLESTEROL What is trans fat? Trans fat is a type of fat that is formed when vegetable oil is hardened through a process called hydrogenation. This process helps makes foods more solid, gives them shape, and prolongs their shelf life. Trans fats are also called hydrogenated or partially hydrogenated oils.  What do saturated fat, trans fat, and cholesterol in foods have to do with heart disease? Saturated fat, trans fat, and cholesterol in the diet all raise the level of LDL "bad" cholesterol in the blood. The higher the LDL cholesterol, the greater the risk for coronary heart disease (CHD). Saturated fat and trans fat raise LDL similarly.  What foods contain saturated fat, trans fat, and cholesterol? High amounts of saturated fat are found in animal products, such as fatty cuts of meat, chicken skin, and full-fat dairy products like butter, whole milk, cream, and cheese, and in tropical vegetable oils such as palm, palm kernel, and coconut oil. Trans fat is found in some of the same foods as saturated fat, such as vegetable shortening, some margarines (especially hard or stick margarine), crackers, cookies, baked goods, fried foods, salad dressings, and other processed foods made with partially hydrogenated vegetable oils. Small amounts of trans fat  also occur naturally in some animal products, such as milk products, beef, and lamb. Foods high in cholesterol include liver, other organ meats, egg yolks, shrimp, and full-fat dairy products. How can I use the new food label to make heart-healthy food choices? Check the Nutrition Facts panel of the food label. Choose foods lower in saturated fat, trans fat, and cholesterol. For saturated fat and cholesterol, you can also use the Percent Daily Value (%DV): 5% DV or less is low, and 20% DV or more is high. (There is no %DV for trans fat.) Use the Nutrition Facts panel to choose foods low in   saturated fat and cholesterol, and if the trans fat is not listed, read the ingredients and limit products that list shortening or hydrogenated or partially hydrogenated vegetable oil, which tend to be high in trans fat. POINTS TO REMEMBER: YOU NEED A LITTLE TLC (THERAPEUTIC LIFESTYLE CHANGES)  Discuss your risk for heart disease with your caregivers, and take steps to reduce risk factors.   Change your diet. Choose foods that are low in saturated fat, trans fat, and cholesterol.   Add exercise to your daily routine if it is not already being done. Participate in physical activity of moderate intensity, like brisk walking, for at least 30 minutes on most, and preferably all days of the week. No time? Break the 30 minutes into three, 10-minute segments during the day.   Stop smoking. If you do smoke, contact your caregiver to discuss ways in which they can help you quit.   Do not use street drugs.   Maintain a normal weight.   Maintain a healthy blood pressure.   Keep up with your blood work for checking the fats in your blood as directed by your caregiver.  Document Released: 12/22/2003 Document Revised: 02/22/2011 Document Reviewed: 07/19/2008 [employer] Patient Information 2012 [employer]. 

## 2011-09-07 NOTE — Progress Notes (Signed)
  Subjective:    Patient ID: Scott White, male    DOB: August 26, 1960, 51 y.o.   MRN: [id number]  HPI  This 51 y.o. Cauc male has CAD and Hyperlipidemia, is followed by Cardiology and has modified  lifestyle with significant weight loss in last 8- 10 years. He takes all meds as prescribed and denies  any problems with Crestor (no myalgias or fatigue). Here for labs and RF of Crestor.   Last CPE August 18, 2010 with Dr. [name].    Review of Systems noncontributory    Objective:   Physical Exam  Vitals reviewed. Constitutional: He is oriented to person, place, and time. He appears well-developed and well-nourished. No distress.  HENT:  Head: Normocephalic and atraumatic.  Eyes: EOM are normal. No scleral icterus.  Pulmonary/Chest: Effort normal. No respiratory distress.  Musculoskeletal: He exhibits no edema.  Neurological: He is alert and oriented to person, place, and time. No cranial nerve deficit.    LABS: 08/18/10-  Gluc= 138   T Chol= 274   TGs= 371   HDL= 39   LDL= 161   TSH= 8.413   Vit D= 58      Assessment & Plan:   1. Hyperlipidemia  Lipid panel, ALT, POCT glucose (manual entry)   Continue Crestor  (RF x 6 months)and follow- up with Cardiology  Schedule CPE before end of year

## 2011-09-12 ENCOUNTER — Encounter: Payer: Self-pay | Admitting: Internal Medicine

## 2011-09-12 NOTE — Progress Notes (Signed)
Quick Note:  Please call pt and advise that the following labs are abnormal... Lipids are a little abnormal; continue current medications and work on healthy nutrition.   Schedule physical before end of year.  Copy to pt. ______

## 2011-09-25 ENCOUNTER — Ambulatory Visit: Payer: BC Managed Care – PPO | Admitting: Cardiology

## 2011-10-14 ENCOUNTER — Other Ambulatory Visit: Payer: Self-pay | Admitting: Physician Assistant

## 2011-10-17 ENCOUNTER — Ambulatory Visit (INDEPENDENT_AMBULATORY_CARE_PROVIDER_SITE_OTHER): Payer: BC Managed Care – PPO | Admitting: Internal Medicine

## 2011-10-17 ENCOUNTER — Encounter: Payer: Self-pay | Admitting: Internal Medicine

## 2011-10-17 VITALS — BP 138/76 | HR 71 | Resp 18 | Ht 71.0 in | Wt 219.4 lb

## 2011-10-17 DIAGNOSIS — I4891 Unspecified atrial fibrillation: Secondary | ICD-10-CM

## 2011-10-17 NOTE — Assessment & Plan Note (Signed)
Maintaining sinus rhythm with tikosyn On pradaxa for stroke prevention Check Mg and BMET today  Return in 6 months

## 2011-10-17 NOTE — Progress Notes (Signed)
PCP:  Shade Flood, MD  The patient presents today for routine electrophysiology followup.  He is doing well with tikosyn.  He has had no further symptoms of afib.  He has occasional palpitations related to his frequent PVCs.  Today, he denies symptoms of  chest pain, shortness of breath, orthopnea, PND, lower extremity edema, dizziness, presyncope, syncope, or neurologic sequela.  The patient feels that he is tolerating medications without difficulties and is otherwise without complaint today.   Past Medical History  Diagnosis Date  . CAD (coronary artery disease)     a. Status post acute inferior wall myocardial infarction emergent bare metal stenting of the mid RCA, May 2004,  residual nonobstructive coronary artery disease Ejection fraction 60% by echocardiography 04/2007  . HTN (hypertension)   . Hyperlipidemia   . Sleep apnea     followed by Dr [name]  . Myocardial infarction   . Atrial fibrillation     a. Persistent, s/p afib ablation by JA 08/16/10;  b. s/p failed DCCV 05/2011   Past Surgical History  Procedure Date  . Cardiac catheterization 2004     Successful PTCA and stent placement in the mid right   coronary artery with an extra 99% narrowing to 0% with placement of a 3.5 x  20 mm Express 2 stent with improvement of TIMI grade 1 flow to TIMI grade 3.  . Atrial ablatio 08/16/10    afib ablation by JA  . Cardioversion 06/15/2011    Procedure: CARDIOVERSION;  Surgeon: Pricilla Riffle, MD;  Location: [facility] OR;  Service: Cardiovascular;  Laterality: N/A;    Current Outpatient Prescriptions  Medication Sig Dispense Refill  . dabigatran (PRADAXA) 150 MG CAPS Take 1 capsule (150 mg total) by mouth 2 (two) times daily.  60 capsule  5  . dofetilide (TIKOSYN) 500 MCG capsule Take 500 mcg by mouth every 12 (twelve) hours.      [name] lisinopril (PRINIVIL,ZESTRIL) 20 MG tablet Take 20 mg by mouth every evening.      . metoprolol succinate (TOPROL-XL) 25 MG 24 hr tablet Take 25 mg by mouth every  evening.       . Multiple Vitamins-Minerals (CENTRUM PO) Take 1 tablet by mouth 2 (two) times daily.       . niacin 100 MG tablet Take 100 mg by mouth daily with breakfast.        . nitroGLYCERIN (NITROSTAT) 0.4 MG SL tablet Place 0.4 mg under the tongue every 5 (five) minutes as needed. For chest pain.      . rosuvastatin (CRESTOR) 10 MG tablet Take 1 tablet (10 mg total) by mouth daily.  30 tablet  5  . DISCONTD: dofetilide (TIKOSYN) 500 MCG capsule Take 1 capsule (500 mcg total) by mouth every 12 (twelve) hours.  60 capsule  3    No Known Allergies  History   Social History  . Marital Status: Single    Spouse Name: N/A    Number of Children: N  . Years of Education: N/A   Occupational History  . Engineer, water    Social History Main Topics  . Smoking status: Never Smoker   . Smokeless tobacco: Never Used  . Alcohol Use: 0.6 oz/week    1 Cans of beer per week      three times a week  . Drug Use: No  . Sexually Active: Yes   Other Topics Concern  . Not on file   Social History Narrative   Pt lives in  [place] alone.  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 Coronary artery disease Mother   . Heart failure Mother   . Alzheimer's disease Father     Physical Exam: Filed Vitals:   10/17/11 1457  BP: 138/76  Pulse: 71  Resp: 18  Height: 5\' 11"  (1.803 m)  Weight: 219 lb 6.4 oz (99.519 kg)  SpO2: 98%    GEN- The patient is well appearing, alert and oriented x 3 today.   Head- normocephalic, atraumatic Eyes-  Sclera clear, conjunctiva pink Ears- hearing intact Oropharynx- clear Neck- supple, no JVP Lymph- no cervical lymphadenopathy Lungs- Clear to ausculation bilaterally, normal work of breathing Heart- regular rate and rhythm with frequent ectopy, no murmurs, rubs or gallops, PMI not laterally displaced GI- soft, NT, ND, + BS Extremities- no clubbing, cyanosis, or edema Neuro-  strength and sensation are intact  ekg today reveals afib 76 bpm, Qtc 490 with RBBB Superior axis PVCs  Assessment and Plan:

## 2011-10-17 NOTE — Patient Instructions (Signed)
Your physician wants you to follow-up in: 6 months with Dr Jacquiline Doe will receive a reminder letter in the mail two months in advance. If you don't receive a letter, please call our office to schedule the follow-up appointment.  Your physician recommends that you return for lab work in: today  If Pradaxa is still more than $10 per month, even with card, call and we will change to a different medication.

## 2011-10-18 LAB — BASIC METABOLIC PANEL
CO2: 25 mEq/L (ref 19–32)
Calcium: 9.1 mg/dL (ref 8.4–10.5)
Creatinine, Ser: 0.9 mg/dL (ref 0.4–1.5)
GFR: 96.94 mL/min (ref >60.00)
Sodium: 138 mEq/L (ref 135–145)

## 2011-10-18 LAB — MAGNESIUM: Magnesium: 2.2 mg/dL (ref 1.5–2.5)

## 2011-10-30 NOTE — Addendum Note (Signed)
Addended by: Lacie Scotts on: 10/30/2011 05:42 PM   Modules accepted: Orders

## 2011-11-26 ENCOUNTER — Ambulatory Visit: Payer: BC Managed Care – PPO | Admitting: Cardiology

## 2011-11-30 ENCOUNTER — Other Ambulatory Visit: Payer: Self-pay | Admitting: Cardiology

## 2011-11-30 ENCOUNTER — Ambulatory Visit (INDEPENDENT_AMBULATORY_CARE_PROVIDER_SITE_OTHER): Payer: BC Managed Care – PPO | Admitting: Cardiology

## 2011-11-30 ENCOUNTER — Encounter: Payer: Self-pay | Admitting: Cardiology

## 2011-11-30 VITALS — BP 100/50 | HR 76 | Ht 71.0 in | Wt 216.0 lb

## 2011-11-30 DIAGNOSIS — I2510 Atherosclerotic heart disease of native coronary artery without angina pectoris: Secondary | ICD-10-CM

## 2011-11-30 MED ORDER — ROSUVASTATIN CALCIUM 20 MG PO TABS: 20.0000 mg | ORAL_TABLET | Freq: Every day | ORAL | Status: DC

## 2011-11-30 NOTE — Assessment & Plan Note (Signed)
Continue tikosyn and pradaxa; check hemoglobin, renal function in 6 weeks.

## 2011-11-30 NOTE — Assessment & Plan Note (Signed)
Continue statin. Not on aspirin given need for anti-coagulation. Schedule stress Myoview for risk stratification.

## 2011-11-30 NOTE — Assessment & Plan Note (Signed)
Recent LDL greater than 100. Change Crestor to 20 mg daily. Check lipids and liver in 6 weeks.

## 2011-11-30 NOTE — Assessment & Plan Note (Signed)
Blood pressure controlled. Continue present medications. 

## 2011-11-30 NOTE — Progress Notes (Signed)
HPI: 51 year-old male for followup of atrial fibrillation, coronary disease, hypertension and hyperlipidemia. Patient has had previous PCI of his right coronary artery in the setting of an acute infarct in 2004. No obstructive disease in the left system. He has had atrial fibrillation and has undergone previous ablation. Last echocardiogram in April of 2013 showed normal LV function. On 10/17/2011 magnesium 2.2 and potassium 4.0. Patient denies dyspnea on exertion, orthopnea, PND, pedal edema, syncope or chest pain. Occasional palpitations that are short lived.  Current Outpatient Prescriptions  Medication Sig Dispense Refill  . dabigatran (PRADAXA) 150 MG CAPS Take 1 capsule (150 mg total) by mouth 2 (two) times daily.  60 capsule  5  . dofetilide (TIKOSYN) 500 MCG capsule Take 500 mcg by mouth every 12 (twelve) hours.      [name] lisinopril (PRINIVIL,ZESTRIL) 20 MG tablet Take 20 mg by mouth every evening.      . metoprolol succinate (TOPROL-XL) 25 MG 24 hr tablet Take 25 mg by mouth every evening.       . Multiple Vitamins-Minerals (CENTRUM PO) Take 1 tablet by mouth 2 (two) times daily.       . niacin 100 MG tablet Take 100 mg by mouth daily with breakfast.        . nitroGLYCERIN (NITROSTAT) 0.4 MG SL tablet Place 0.4 mg under the tongue every 5 (five) minutes as needed. For chest pain.      . rosuvastatin (CRESTOR) 10 MG tablet Take 1 tablet (10 mg total) by mouth daily.  30 tablet  5     Past Medical History  Diagnosis Date  . CAD (coronary artery disease)     a. Status post acute inferior wall myocardial infarction emergent bare metal stenting of the mid RCA, May 2004,  residual nonobstructive coronary artery disease Ejection fraction 60% by echocardiography 04/2007  . HTN (hypertension)   . Hyperlipidemia   . Sleep apnea     followed by Dr [name]  . Myocardial infarction   . Atrial fibrillation     a. Persistent, s/p afib ablation by JA 08/16/10;  b. s/p failed DCCV 05/2011    Past  Surgical History  Procedure Date  . Cardiac catheterization 2004     Successful PTCA and stent placement in the mid right   coronary artery with an extra 99% narrowing to 0% with placement of a 3.5 x  20 mm Express 2 stent with improvement of TIMI grade 1 flow to TIMI grade 3.  . Atrial ablatio 08/16/10    afib ablation by JA  . Cardioversion 06/15/2011    Procedure: CARDIOVERSION;  Surgeon: Pricilla Riffle, MD;  Location: [facility] OR;  Service: Cardiovascular;  Laterality: N/A;    History   Social History  . Marital Status: Single    Spouse Name: N/A    Number of Children: N  . Years of Education: N/A   Occupational History  . Engineer, water    Social History Main Topics  . Smoking status: Never Smoker   . Smokeless tobacco: Never Used  . Alcohol Use: 0.6 oz/week    1 Cans of beer per week      three times a week  . Drug Use: No  . Sexually Active: Yes   Other Topics Concern  . Not on file   Social History Narrative   Pt lives in [place] alone.  Single.He is a Engineer, water for [employer]    ROS: no fevers or chills,  productive cough, hemoptysis, dysphasia, odynophagia, melena, hematochezia, dysuria, hematuria, rash, seizure activity, orthopnea, PND, pedal edema, claudication. Remaining systems are negative.  Physical Exam: Well-developed well-nourished in no acute distress.  Skin is warm and dry.  HEENT is normal.  Neck is supple.  Chest is clear to auscultation with normal expansion.  Cardiovascular exam is regular rate and rhythm.  Abdominal exam nontender or distended. No masses palpated. Extremities show no edema. neuro grossly intact  ECG 10/17/11 -sinus rhythm with PVCs and prolonged QT.

## 2011-11-30 NOTE — Patient Instructions (Addendum)
Your physician wants you to follow-up in: 6 MONTHS WITH DR Jens Som You will receive a reminder letter in the mail two months in advance. If you don't receive a letter, please call our office to schedule the follow-up appointment.  Your physician has requested that you have en exercise stress myoview. For further information please visit [email or web address] Please follow instruction sheet, as given.SCHEDULE IN 6 WEEKS  INCREASE CRESTOR TO 20 MG ONCE DAILY  Your physician recommends that you return for lab work in: 6 WEEKS WITH STRESS TEST

## 2012-01-08 ENCOUNTER — Encounter (HOSPITAL_COMMUNITY): Payer: BC Managed Care – PPO

## 2012-01-08 ENCOUNTER — Other Ambulatory Visit: Payer: BC Managed Care – PPO

## 2012-01-14 ENCOUNTER — Ambulatory Visit (HOSPITAL_COMMUNITY): Payer: BC Managed Care – PPO | Attending: Cardiology | Admitting: Radiology

## 2012-01-14 ENCOUNTER — Ambulatory Visit (INDEPENDENT_AMBULATORY_CARE_PROVIDER_SITE_OTHER): Payer: BC Managed Care – PPO | Admitting: *Deleted

## 2012-01-14 VITALS — BP 121/74 | HR 68 | Ht 71.0 in | Wt 209.0 lb

## 2012-01-14 DIAGNOSIS — R000 Tachycardia, unspecified: Secondary | ICD-10-CM | POA: Insufficient documentation

## 2012-01-14 DIAGNOSIS — I2510 Atherosclerotic heart disease of native coronary artery without angina pectoris: Secondary | ICD-10-CM | POA: Insufficient documentation

## 2012-01-14 DIAGNOSIS — I4949 Other premature depolarization: Secondary | ICD-10-CM

## 2012-01-14 DIAGNOSIS — I10 Essential (primary) hypertension: Secondary | ICD-10-CM | POA: Insufficient documentation

## 2012-01-14 DIAGNOSIS — R002 Palpitations: Secondary | ICD-10-CM | POA: Insufficient documentation

## 2012-01-14 DIAGNOSIS — I472 Ventricular tachycardia, unspecified: Secondary | ICD-10-CM

## 2012-01-14 DIAGNOSIS — I4891 Unspecified atrial fibrillation: Secondary | ICD-10-CM | POA: Insufficient documentation

## 2012-01-14 LAB — CBC WITH DIFFERENTIAL/PLATELET
Basophils Relative: 0.6 % (ref 0.0–3.0)
Eosinophils Relative: 2 % (ref 0.0–5.0)
Hemoglobin: 15.1 g/dL (ref 13.0–17.0)
Lymphocytes Relative: 36.8 % (ref 12.0–46.0)
MCHC: 32.9 g/dL (ref 30.0–36.0)
Monocytes Relative: 9.9 % (ref 3.0–12.0)
Neutro Abs: 2.4 10*3/uL (ref 1.4–7.7)
Neutrophils Relative %: 50.7 % (ref 43.0–77.0)
RBC: 5.11 Mil/uL (ref 4.22–5.81)
WBC: 4.7 10*3/uL (ref 4.5–10.5)

## 2012-01-14 LAB — LIPID PANEL
Cholesterol: 160 mg/dL (ref 0–200)
LDL Cholesterol: 91 mg/dL (ref 0–99)
Total CHOL/HDL Ratio: 5
Triglycerides: 189 mg/dL — ABNORMAL HIGH (ref 0.0–149.0)

## 2012-01-14 LAB — BASIC METABOLIC PANEL
BUN: 14 mg/dL (ref 6–23)
CO2: 28 mEq/L (ref 19–32)
Chloride: 104 mEq/L (ref 96–112)
Creatinine, Ser: 1 mg/dL (ref 0.4–1.5)
Potassium: 4.2 mEq/L (ref 3.5–5.1)

## 2012-01-14 LAB — HEPATIC FUNCTION PANEL
ALT: 25 U/L (ref 0–53)
AST: 22 U/L (ref 0–37)
Total Bilirubin: 0.7 mg/dL (ref 0.3–1.2)
Total Protein: 7 g/dL (ref 6.0–8.3)

## 2012-01-14 MED ORDER — TECHNETIUM TC 99M SESTAMIBI GENERIC - CARDIOLITE
10.0000 | Freq: Once | INTRAVENOUS | Status: AC | PRN
  Administered 2012-01-14: 10 via INTRAVENOUS

## 2012-01-14 MED ORDER — TECHNETIUM TC 99M SESTAMIBI GENERIC - CARDIOLITE
30.0000 | Freq: Once | INTRAVENOUS | Status: AC | PRN
  Administered 2012-01-14: 30 via INTRAVENOUS

## 2012-01-14 NOTE — Progress Notes (Signed)
[facility] SITE 3 NUCLEAR MED [id number] [place] [address] [phone]  Cardiology Nuclear Med Study  Scott White is a 51 y.o. male     MRN : [id number]     DOB: 09-28-1960  Procedure Date: 01/14/2012  Nuclear Med Background Indication for Stress Test:  Evaluation for Ischemia and Stent Patency History:  '04 MI>Stent-RCA with residual n/o CAD, EF=40%; '08 GEX:BMWUXL; '12 Ablation; Multiple Cardioversions, Last One 3/13; 4/13 Echo:EF=60%; Atrial Fibrilliation Cardiac Risk Factors: Family History - CAD, Hypertension and Lipids  Symptoms:  Palpitations and Rapid HR   Nuclear Pre-Procedure Caffeine/Decaff Intake:  None NPO After: 7:00pm   Lungs:  Clear. O2 Sat: 100% on room air. IV 0.9% NS with Angio Cath:  20g  IV Site: R Antecubital  IV Started by:  Stanton Kidney, EMT-P  Chest Size (in):  44 Cup Size: n/a  Height: 5\' 11"  (1.803 m)  Weight:  209 lb (94.802 kg)  BMI:  Body mass index is 29.15 kg/(m^2). Tech Comments:  Toprol held > 48 hours, per patient.    Nuclear Med Study 1 or 2 day study: 1 day  Stress Test Type:  Stress  Reading MD: Cassell Clement, MD  Order Authorizing Provider:  Olga Millers, MD  Resting Radionuclide: Technetium 69m Sestamibi  Resting Radionuclide Dose: 11.0 mCi   Stress Radionuclide:  Technetium 31m Sestamibi  Stress Radionuclide Dose: 33.0 mCi           Stress Protocol Rest HR: 68 Stress HR: 144  Rest BP: 121/74 Stress BP: 162/75  Exercise Time (min): 12:01 METS: 13.7   Predicted Max HR: 169 bpm % Max HR: 85.21 bpm Rate Pressure Product: 24401   Dose of Adenosine (mg):  n/a Dose of Lexiscan: n/a mg  Dose of Atropine (mg): n/a Dose of Dobutamine: n/a mcg/kg/min (at max HR)  Stress Test Technologist: Smiley Houseman, CMA-N  Nuclear Technologist:  Domenic Polite, CNMT     Rest Procedure:  Myocardial perfusion imaging was performed at rest 45 minutes following the intravenous administration of  Technetium 86m Sestamibi.  Rest ECG: Nonspecific T-wave changes with occasional PVC's.  Stress Procedure:  The patient exercised on the treadmill utilizing the Bruce protocol for 12:01 minutes. He then stopped due to fatigue and denied any chest pain.  There were ST-T wave changes with frequent PVC's, rare couplet and occasional PAC's.  Technetium 84m Sestamibi was injected at peak exercise and myocardial perfusion imaging was performed after a brief delay.  Stress ECG: 1 mm ST depression at peak exercise in V4-V6  QPS Raw Data Images:  Normal; no motion artifact; normal heart/lung ratio. Stress Images:  Normal homogeneous uptake in all areas of the myocardium. Rest Images:  Normal homogeneous uptake in all areas of the myocardium. Subtraction (SDS):  No evidence of ischemia. Transient Ischemic Dilatation (Normal <1.22):  0.88 Lung/Heart Ratio (Normal <0.45):  0.13  Quantitative Gated Spect Images QGS EDV:  112 ml QGS ESV:  56 ml  Impression Exercise Capacity:  Excellent exercise capacity. BP Response:  Normal blood pressure response. Clinical Symptoms:  No chest pain. ECG Impression:  1 mm ST depression in V4-V6 at peak exercise. Comparison with Prior Nuclear Study: No previous nuclear study performed  Overall Impression:  Low risk stress nuclear study. No chest pain.  Excellent exercise tolerance 12 minutes on Bruce protocol. Minor EKG changes at peak exercise.  No scintographic evidence of ischemia.  LV Ejection Fraction: 50%.  LV Wall Motion:  NL  LV Function; NL Wall Motion  [employer]

## 2012-01-18 ENCOUNTER — Telehealth: Payer: Self-pay | Admitting: Cardiology

## 2012-01-18 NOTE — Telephone Encounter (Signed)
Patient returning nurse DM call, he can be reached at wk or cell #

## 2012-01-18 NOTE — Telephone Encounter (Signed)
Spoke with pt, he is aware of lab and nuc results. He does not want to increase crestor at this time because he is having some knee problems with the 20 mg dosage. Will cont 20 mg crestor at present

## 2012-02-25 ENCOUNTER — Other Ambulatory Visit: Payer: Self-pay | Admitting: *Deleted

## 2012-02-25 MED ORDER — DABIGATRAN ETEXILATE MESYLATE 150 MG PO CAPS: 150.0000 mg | ORAL_CAPSULE | Freq: Two times a day (BID) | ORAL | Status: DC

## 2012-06-27 ENCOUNTER — Other Ambulatory Visit: Payer: Self-pay | Admitting: Cardiology

## 2012-07-23 ENCOUNTER — Other Ambulatory Visit: Payer: Self-pay | Admitting: *Deleted

## 2012-07-23 MED ORDER — LISINOPRIL 20 MG PO TABS: 20.0000 mg | ORAL_TABLET | Freq: Every evening | ORAL | Status: DC

## 2012-08-28 ENCOUNTER — Telehealth: Payer: Self-pay

## 2012-08-28 MED ORDER — DABIGATRAN ETEXILATE MESYLATE 150 MG PO CAPS: 150.0000 mg | ORAL_CAPSULE | Freq: Two times a day (BID) | ORAL | Status: DC

## 2012-08-28 NOTE — Telephone Encounter (Signed)
pt called left message on refill line to rqst pradaxa.pt past due for follow up, called pt left lmom to call and schedule appt.[facility] 90 r-0

## 2012-08-29 ENCOUNTER — Other Ambulatory Visit: Payer: Self-pay | Admitting: Internal Medicine

## 2012-09-10 ENCOUNTER — Other Ambulatory Visit: Payer: Self-pay | Admitting: Cardiology

## 2012-11-04 ENCOUNTER — Ambulatory Visit (INDEPENDENT_AMBULATORY_CARE_PROVIDER_SITE_OTHER): Payer: BC Managed Care – PPO | Admitting: Cardiology

## 2012-11-04 ENCOUNTER — Encounter: Payer: Self-pay | Admitting: Cardiology

## 2012-11-04 VITALS — BP 110/70 | HR 71 | Wt 206.0 lb

## 2012-11-04 DIAGNOSIS — I2510 Atherosclerotic heart disease of native coronary artery without angina pectoris: Secondary | ICD-10-CM

## 2012-11-04 DIAGNOSIS — I4891 Unspecified atrial fibrillation: Secondary | ICD-10-CM

## 2012-11-04 DIAGNOSIS — I10 Essential (primary) hypertension: Secondary | ICD-10-CM

## 2012-11-04 LAB — LIPID PANEL
Cholesterol: 158 mg/dL (ref 0–200)
HDL: 35.9 mg/dL — ABNORMAL LOW (ref >39.00)
VLDL: 42 mg/dL — ABNORMAL HIGH (ref 0.0–40.0)

## 2012-11-04 LAB — HEPATIC FUNCTION PANEL
ALT: 24 U/L (ref 0–53)
Albumin: 4.4 g/dL (ref 3.5–5.2)
Total Protein: 7.9 g/dL (ref 6.0–8.3)

## 2012-11-04 NOTE — Assessment & Plan Note (Signed)
Blood pressure controlled. Continue present medications. 

## 2012-11-04 NOTE — Progress Notes (Signed)
HPI: FU atrial fibrillation, coronary disease, hypertension and hyperlipidemia. Patient has had previous PCI of his right coronary artery in the setting of an acute infarct in 2004. No obstructive disease in the left system. He has had atrial fibrillation and has undergone previous ablation. Last echocardiogram in April of 2013 showed normal LV function. Nuclear study in October of 2013 showed an ejection fraction of 50%. There was no ischemia. I last saw him in September of 2013. Since that time, the patient denies any dyspnea on exertion, orthopnea, PND, pedal edema, syncope or chest pain. Occasional brief palpitations but not sustained.    Current Outpatient Prescriptions  Medication Sig Dispense Refill  . dabigatran (PRADAXA) 150 MG CAPS Take 1 capsule (150 mg total) by mouth 2 (two) times daily.  180 capsule  0  . lisinopril (PRINIVIL,ZESTRIL) 20 MG tablet Take 1 tablet (20 mg total) by mouth every evening.  30 tablet  5  . metoprolol succinate (TOPROL-XL) 25 MG 24 hr tablet TAKE 1 TABLET BY MOUTH EVERY DAY  30 tablet  6  . Multiple Vitamins-Minerals (CENTRUM PO) Take 1 tablet by mouth 2 (two) times daily.       . niacin 100 MG tablet Take 100 mg by mouth daily with breakfast.        . nitroGLYCERIN (NITROSTAT) 0.4 MG SL tablet Place 0.4 mg under the tongue every 5 (five) minutes as needed. For chest pain.      . rosuvastatin (CRESTOR) 20 MG tablet Take 1 tablet (20 mg total) by mouth daily.  30 tablet  12  . TIKOSYN 500 MCG capsule TAKE ONE CAPSULE TWICE A DAY  180 capsule  3   No current facility-administered medications for this visit.     Past Medical History  Diagnosis Date  . CAD (coronary artery disease)     a. Status post acute inferior wall myocardial infarction emergent bare metal stenting of the mid RCA, May 2004,  residual nonobstructive coronary artery disease Ejection fraction 60% by echocardiography 04/2007  . HTN (hypertension)   . Hyperlipidemia   . Sleep apnea    followed by Dr [name]  . Myocardial infarction   . Atrial fibrillation     a. Persistent, s/p afib ablation by JA 08/16/10;  b. s/p failed DCCV 05/2011    Past Surgical History  Procedure Laterality Date  . Cardiac catheterization  2004     Successful PTCA and stent placement in the mid right   coronary artery with an extra 99% narrowing to 0% with placement of a 3.5 x  20 mm Express 2 stent with improvement of TIMI grade 1 flow to TIMI grade 3.  . Atrial ablatio  08/16/10    afib ablation by JA  . Cardioversion  06/15/2011    Procedure: CARDIOVERSION;  Surgeon: Pricilla Riffle, MD;  Location: [facility] OR;  Service: Cardiovascular;  Laterality: N/A;    History   Social History  . Marital Status: Single    Spouse Name: N/A    Number of Children: N  . Years of Education: N/A   Occupational History  . Engineer, water    Social History Main Topics  . Smoking status: Never Smoker   . Smokeless tobacco: Never Used  . Alcohol Use: 0.6 oz/week    1 Cans of beer per week     Comment:  three times a week  . Drug Use: No  . Sexual Activity: Yes   Other Topics Concern  . Not on  file   Social History Narrative   Pt lives in [place] alone.  Single.   He is a Engineer, water for [employer]    ROS: no fevers or chills, productive cough, hemoptysis, dysphasia, odynophagia, melena, hematochezia, dysuria, hematuria, rash, seizure activity, orthopnea, PND, pedal edema, claudication. Remaining systems are negative.  Physical Exam: Well-developed well-nourished in no acute distress.  Skin is warm and dry.  HEENT is normal.  Neck is supple.  Chest is clear to auscultation with normal expansion.  Cardiovascular exam is regular rate and rhythm.  Abdominal exam nontender or distended. No masses palpated. Extremities show no edema. neuro grossly intact  ECG sinus rhythm with occasional PVC. Prolonged QT interval.

## 2012-11-04 NOTE — Assessment & Plan Note (Signed)
Continue statin. Not on aspirin given need for anticoagulation. 

## 2012-11-04 NOTE — Patient Instructions (Addendum)
Your physician wants you to follow-up in: ONE YEAR WITH DR Shelda Pal will receive a reminder letter in the mail two months in advance. If you don't receive a letter, please call our office to schedule the follow-up appointment.   Your physician recommends that you return for lab work TODAY AND EVERY 6 MONTHS

## 2012-11-04 NOTE — Assessment & Plan Note (Signed)
Continue statin.check lipids and liver. 

## 2012-11-04 NOTE — Assessment & Plan Note (Signed)
Patient remains in sinus rhythm. Continue tikosyn and pradaxa. Check hemoglobin, renal function, potassium and magnesium.

## 2012-11-12 ENCOUNTER — Telehealth: Payer: Self-pay | Admitting: Cardiology

## 2012-11-12 MED ORDER — ROSUVASTATIN CALCIUM 40 MG PO TABS: 40.0000 mg | ORAL_TABLET | Freq: Every day | ORAL | Status: DC

## 2012-11-12 NOTE — Telephone Encounter (Signed)
Spoke with pt, aware of labs. He will retry the crestor 40 mg and let me know if he has any problems.

## 2012-11-12 NOTE — Telephone Encounter (Signed)
Follow up ° ° ° °Pt returning your phone call °

## 2012-11-12 NOTE — Telephone Encounter (Signed)
Follow Up   Pt is returning a call,

## 2012-11-27 ENCOUNTER — Other Ambulatory Visit: Payer: Self-pay | Admitting: *Deleted

## 2012-11-27 MED ORDER — DABIGATRAN ETEXILATE MESYLATE 150 MG PO CAPS: 150.0000 mg | ORAL_CAPSULE | Freq: Two times a day (BID) | ORAL | Status: DC

## 2012-12-12 ENCOUNTER — Other Ambulatory Visit: Payer: Self-pay | Admitting: Cardiology

## 2012-12-12 MED ORDER — ROSUVASTATIN CALCIUM 40 MG PO TABS: 40.0000 mg | ORAL_TABLET | Freq: Every day | ORAL | Status: DC

## 2012-12-12 NOTE — Telephone Encounter (Signed)
REFILL 

## 2012-12-30 ENCOUNTER — Other Ambulatory Visit: Payer: Self-pay | Admitting: Cardiology

## 2012-12-31 ENCOUNTER — Other Ambulatory Visit: Payer: Self-pay | Admitting: *Deleted

## 2012-12-31 MED ORDER — ROSUVASTATIN CALCIUM 40 MG PO TABS: 40.0000 mg | ORAL_TABLET | Freq: Every day | ORAL | Status: DC

## 2013-01-13 ENCOUNTER — Other Ambulatory Visit: Payer: Self-pay

## 2013-01-13 MED ORDER — LISINOPRIL 20 MG PO TABS: 20.0000 mg | ORAL_TABLET | Freq: Every evening | ORAL | Status: DC

## 2013-01-22 ENCOUNTER — Other Ambulatory Visit: Payer: Self-pay

## 2013-03-16 ENCOUNTER — Ambulatory Visit (INDEPENDENT_AMBULATORY_CARE_PROVIDER_SITE_OTHER): Payer: BC Managed Care – PPO | Admitting: Physician Assistant

## 2013-03-16 ENCOUNTER — Encounter: Payer: Self-pay | Admitting: Physician Assistant

## 2013-03-16 ENCOUNTER — Other Ambulatory Visit: Payer: Self-pay | Admitting: Physician Assistant

## 2013-03-16 VITALS — BP 127/82 | HR 76 | Temp 98.0°F | Resp 17 | Ht 71.0 in | Wt 212.0 lb

## 2013-03-16 DIAGNOSIS — R195 Other fecal abnormalities: Secondary | ICD-10-CM

## 2013-03-16 DIAGNOSIS — I10 Essential (primary) hypertension: Secondary | ICD-10-CM

## 2013-03-16 DIAGNOSIS — E785 Hyperlipidemia, unspecified: Secondary | ICD-10-CM

## 2013-03-16 DIAGNOSIS — Z0000 Encounter for general adult medical examination without abnormal findings: Secondary | ICD-10-CM

## 2013-03-16 DIAGNOSIS — I25810 Atherosclerosis of coronary artery bypass graft(s) without angina pectoris: Secondary | ICD-10-CM

## 2013-03-16 DIAGNOSIS — G4730 Sleep apnea, unspecified: Secondary | ICD-10-CM

## 2013-03-16 DIAGNOSIS — R739 Hyperglycemia, unspecified: Secondary | ICD-10-CM

## 2013-03-16 DIAGNOSIS — I252 Old myocardial infarction: Secondary | ICD-10-CM

## 2013-03-16 LAB — LIPID PANEL
Cholesterol: 153 mg/dL (ref 0–200)
HDL: 34 mg/dL — ABNORMAL LOW (ref >39)
LDL Cholesterol: 81 mg/dL (ref 0–99)
Total CHOL/HDL Ratio: 4.5 Ratio
Triglycerides: 191 mg/dL — ABNORMAL HIGH (ref <150)

## 2013-03-16 LAB — COMPREHENSIVE METABOLIC PANEL
AST: 23 U/L (ref 0–37)
BUN: 12 mg/dL (ref 6–23)
Calcium: 9.2 mg/dL (ref 8.4–10.5)
Chloride: 102 mEq/L (ref 96–112)
Creat: 0.99 mg/dL (ref 0.50–1.35)

## 2013-03-16 LAB — POCT URINALYSIS DIPSTICK
Bilirubin, UA: NEGATIVE
Glucose, UA: NEGATIVE
Ketones, UA: NEGATIVE
Leukocytes, UA: NEGATIVE
Spec Grav, UA: 1.015
pH, UA: 6

## 2013-03-16 LAB — CBC
HCT: 43.3 % (ref 39.0–52.0)
MCH: 29.3 pg (ref 26.0–34.0)
MCHC: 34.9 g/dL (ref 30.0–36.0)
MCV: 83.9 fL (ref 78.0–100.0)
RDW: 13.9 % (ref 11.5–15.5)
WBC: 5.6 10*3/uL (ref 4.0–10.5)

## 2013-03-16 LAB — TSH: TSH: 1.839 u[IU]/mL (ref 0.350–4.500)

## 2013-03-16 NOTE — Progress Notes (Signed)
Patient ID: Scott White MRN: [id number], DOB: 1960-06-23 52 y.o. Date of Encounter: 03/16/2013, 2:14 PM  Primary Physician: Shade Flood, MD  Chief Complaint: Physical (CPE)  HPI: 52 y.o. male with history of CAD, MI in 2004, atrial fibrillation, hypertension, hyperlipidemia, and sleep apnea here for CPE. Doing well. Last physical was 08/18/2010 with Dr. [name].  1) CAD/MI: History of inferior wall MI 2004. Status post bare metal stent of the mid RCA May 2004. EF 50% by nuclear study October 2013. There was no ischemia. He is doing well from a cardiac standpoint. He denies any chest pain, chest tightness, SOB, DOE, edema, or syncope. Followed by Dr. Jens Som.   2) Atrial fibrillation: Currently taking Tikosyn 500 mcg bid and Pradaxa 150 mg bid without issues. He understands not to miss doses of his Tikosyn. He only develops palpitations if he becomes apneic during the night from his untreated sleep apnea. He otherwise denies any palpitations throughout the day. Followed by Dr. Jens Som.    3) Hypertension: Currently taking lisinopril 20 mg daily and metoprolol succinate 25 mg daily without issues. Tries to eat a healthy diet and get regular exercise. He exercises more in the summer months when he is able to get outside. No tobacco use. He drinks no more than 1 beer per day. No drugs.   4) Hyperlipidemia: Currently taking Crestor 40 mg daily without issues. Tries to eat a healthy diet and exercise regularly. He does get more exercise during the summer months when he is able to get outside more.   5) Sleep apnea: History of both central and central sleep apnea. Last saw Dr. [name] 09/22/10. It was suspected that he would benefit from both dental appliance and CPAP. Patient started out with dental appliance and weight loss. He did see some benefit, but noted continued symptoms, so he added a cervical collar which helped some more. He still complains of palpitations during the night when he  feels like he becomes apneic. He denies palpitations at any other time of the day. He did come across his brother's old CPAP machine, but has not used this after discussing it with Dr. Teddy Spike office.    6) CPE: Takes all medications as prescribed. He has not had his routine screening colonoscopy yet. Asymptomatic. Received his influenza vaccine September 2014. States he got the flu in October 2014.   Review of Systems: Consitutional: No fever, chills, fatigue, night sweats, lymphadenopathy, or weight changes. Eyes: No visual changes, eye redness, or discharge. ENT/Mouth: Ears: No otalgia, tinnitus, hearing loss, discharge. Nose: Positive for epistaxis. No congestion, rhinorrhea, or sinus pain. Throat: No sore throat, post nasal drip, or teeth pain. Cardiovascular: No CP, palpitations, diaphoresis, DOE, edema, orthopnea, PND. Respiratory: No cough, hemoptysis, SOB, or wheezing. Gastrointestinal: No anorexia, dysphagia, reflux, pain, nausea, vomiting, hematemesis, diarrhea, constipation, BRBPR, or melena. Genitourinary: No dysuria, frequency, urgency, hematuria, incontinence, nocturia, decreased urinary stream, discharge, impotence, or testicular pain/masses. Musculoskeletal: No decreased ROM, myalgias, stiffness, joint swelling, or weakness. Skin: No rash, erythema, lesion changes, pain, warmth, jaundice, or pruritis. Neurological: No headache, dizziness, syncope, seizures, tremors, memory loss, coordination problems, or paresthesias. Psychological: No anxiety, depression, hallucinations, SI/HI. Endocrine: No fatigue, polydipsia, polyphagia, polyuria, or known diabetes.   Past Medical History  Diagnosis Date  . CAD (coronary artery disease)     a. Status post acute inferior wall myocardial infarction emergent bare metal stenting of the mid RCA, May 2004,  residual nonobstructive coronary artery disease Ejection fraction 60% by echocardiography 04/2007  .  HTN (hypertension)   . Hyperlipidemia    . Sleep apnea     followed by Dr [name]  . Myocardial infarction   . Atrial fibrillation     a. Persistent, s/p afib ablation by [facility] 08/16/10;  b. s/p failed DCCV 05/2011  . CHF (congestive heart failure)      Past Surgical History  Procedure Laterality Date  . Cardiac catheterization  2004     Successful PTCA and stent placement in the mid right   coronary artery with an extra 99% narrowing to 0% with placement of a 3.5 x  20 mm Express 2 stent with improvement of TIMI grade 1 flow to TIMI grade 3.  . Atrial ablatio  08/16/10    afib ablation by [facility]  . Cardioversion  06/15/2011    Procedure: CARDIOVERSION;  Surgeon: Pricilla Riffle, MD;  Location: [facility];  Service: Cardiovascular;  Laterality: N/A;    Home Meds:  Prior to Admission medications   Medication Sig Start Date End Date Taking? Authorizing Provider  dabigatran (PRADAXA) 150 MG CAPS capsule Take 1 capsule (150 mg total) by mouth 2 (two) times daily. 11/27/12  Yes Lewayne Bunting, MD  lisinopril (PRINIVIL,ZESTRIL) 20 MG tablet Take 1 tablet (20 mg total) by mouth every evening. 01/13/13 01/13/14 Yes Lewayne Bunting, MD  metoprolol succinate (TOPROL-XL) 25 MG 24 hr tablet TAKE 1 TABLET BY MOUTH EVERY DAY 09/10/12  Yes Lewayne Bunting, MD  Multiple Vitamins-Minerals (CENTRUM PO) Take 1 tablet by mouth 2 (two) times daily.    Yes Historical Provider, MD  niacin 100 MG tablet Take 100 mg by mouth daily with breakfast.     Yes Historical Provider, MD  nitroGLYCERIN (NITROSTAT) 0.4 MG SL tablet Place 0.4 mg under the tongue every 5 (five) minutes as needed. For chest pain.   Yes Historical Provider, MD  rosuvastatin (CRESTOR) 40 MG tablet Take 1 tablet (40 mg total) by mouth daily. 12/31/12  Yes Lewayne Bunting, MD  TIKOSYN 500 MCG capsule TAKE ONE CAPSULE TWICE A DAY 08/29/12  Yes Lewayne Bunting, MD    Allergies: No Known Allergies  History   Social History  . Marital Status: Single    Spouse Name: N/A    Number of Children: N    . Years of Education: N/A   Occupational History  . Engineer, water    Social History Main Topics  . Smoking status: Never Smoker   . Smokeless tobacco: Never Used  . Alcohol Use: 0.6 oz/week    1 Cans of beer per week     Comment:  three times a week  . Drug Use: No  . Sexual Activity: Yes   Other Topics Concern  . Not on file   Social History Narrative   Pt lives in [place] alone.  Single.   He is a Engineer, water for [employer]    Family History  Problem Relation Age of Onset  . Coronary artery disease Mother   . Heart failure Mother   . Heart disease Mother   . Hyperlipidemia Mother   . Hypertension Mother   . Alzheimer's disease Father   . Diabetes Father   . Diabetes Brother   . Heart disease Brother   . Hyperlipidemia Brother   . Hypertension Brother   . Alzheimer's disease Brother   . Hyperlipidemia Brother     Physical Exam: Blood pressure 127/82, pulse 76, temperature 98 F (36.7 C), temperature source Oral, resp.  rate 17, height 5\' 11"  (1.803 m), weight 212 lb (96.163 kg), SpO2 97.00%.  General: Well developed, well nourished, in no acute distress. HEENT: Normocephalic, atraumatic. Conjunctiva pink, sclera non-icteric. Pupils 2 mm constricting to 1 mm, round, regular, and equally reactive to light and accomodation. EOMI. Internal auditory canal clear. TMs with good cone of light and without pathology. Nasal mucosa pink. Nares are without discharge. No sinus tenderness. Oral mucosa pink. Dentition normal. Pharynx without exudate.   Neck: Supple. Trachea midline. No thyromegaly. Full ROM. No lymphadenopathy. Lungs: Clear to auscultation bilaterally without wheezes, rales, or rhonchi. Breathing is of normal effort and unlabored. Cardiovascular: RRR with S1 S2. No murmurs, rubs, or gallops appreciated. Distal pulses 2+ symmetrically. No carotid or abdominal bruits. Abdomen: Soft, non-tender, non-distended with normoactive bowel sounds. No  hepatosplenomegaly or masses. No rebound/guarding. No CVA tenderness. Without hernias.  Rectal: No external hemorrhoids or fissures. Rectal vault without masses. Prostate not enlarged, smooth, symmetrical, without nodules, or TTP.  Genitourinary: Circumcised male. No penile lesions. Testes descended bilaterally, and smooth without tenderness or masses.  Musculoskeletal: Full range of motion and 5/5 strength throughout. Without swelling, atrophy, tenderness, crepitus, or warmth. Extremities without clubbing, cyanosis, or edema. Calves supple. Skin: Warm and moist without erythema, ecchymosis, wounds, or rash. Neuro: A+Ox3. CN II-XII grossly intact. Moves all extremities spontaneously. Full sensation throughout. Normal gait. DTR 2+ throughout upper and lower extremities. Finger to nose intact. Psych:  Responds to questions appropriately with a normal affect.   Studies:  Results for orders placed in visit on 03/16/13  IFOBT (OCCULT BLOOD)      Result Value Range   IFOBT Positive    POCT URINALYSIS DIPSTICK      Result Value Range   Color, UA yellow     Clarity, UA clear     Glucose, UA neg     Bilirubin, UA neg     Ketones, UA neg     Spec Grav, UA 1.015     Blood, UA neg     pH, UA 6.0     Protein, UA neg     Urobilinogen, UA 0.2     Nitrite, UA neg     Leukocytes, UA Negative       CBC, CMET, Lipid, PSA, TSH all pending. Patient is fasting,   Assessment/Plan:  52 y.o. male here for CPE with history of CAD, MI, HTN, hyperlipidemia, sleep apnea, atrial fibrillation, and hem positive stool.   1) CAD -Acute infarct 2004 -Cardiac catheterization 2004. Successful PTCA and stent placement in the mid right coronary with an extra 99% narrowing to 0%. -EF of 50% in October 2013 confirmed by nuclear study, no signs of ischemia  -Not on ASA as he is on anticoagulation  -Followed by Dr. Jens Som  2) HTN -Well controlled -Continue current medications -Healthy diet and exercise  3)  Hyperlipidemia -On Crestor 40 mg daily -Continue healthy diet and exercise  4) Sleep apnea -Does not have a CPAP -Currently using a dental appliance and cervical collar  -Discussed with patient in detail the risks of untreated sleep apnea -Advised patient to call Dr. Teddy Spike office and schedule an appointment to discuss obtaining a CPAP and titration  -Offered a referral as well, he declined   5) Atrial fibrillation  -Status post ablation 08/16/2010 -Failed ablation 05/2011 -Underwent cardioversion 06/15/2011 -Last echo in April 2013 showed normal LV function -Continue current medications per cardiology   6) CPE/hem positive stool -Referral for screening colonoscopy made -Hem positive  stool, see above for colonoscopy referral. Discussed with patient -Has received influenza vaccine this year -Healthy diet and exercise -Weight loss -Age appropriate anticipatory guidance   Signed, Eula Listen, PA-C [facility] [place], [place] [address] [phone] 03/16/2013 2:14 PM

## 2013-03-17 LAB — HEMOGLOBIN A1C: Hgb A1c MFr Bld: 7.7 % — ABNORMAL HIGH (ref <5.7)

## 2013-03-17 NOTE — Addendum Note (Signed)
Addended by: Sondra Barges on: 03/17/2013 09:59 AM   Modules accepted: Orders

## 2013-03-18 ENCOUNTER — Other Ambulatory Visit: Payer: Self-pay | Admitting: Physician Assistant

## 2013-03-18 ENCOUNTER — Encounter: Payer: Self-pay | Admitting: Internal Medicine

## 2013-03-18 DIAGNOSIS — E119 Type 2 diabetes mellitus without complications: Secondary | ICD-10-CM

## 2013-03-18 MED ORDER — METFORMIN HCL 500 MG PO TABS: 500.0000 mg | ORAL_TABLET | Freq: Two times a day (BID) | ORAL | Status: DC

## 2013-04-09 ENCOUNTER — Other Ambulatory Visit: Payer: Self-pay | Admitting: Cardiology

## 2013-04-30 ENCOUNTER — Telehealth: Payer: Self-pay | Admitting: *Deleted

## 2013-04-30 NOTE — Telephone Encounter (Signed)
Pt is Pradaxa according to [facility] records.  I tried to call both his home and cell phones to clarify that he is still on this medicine before his PV 05-01-13.  There was no answer and no ID on either number  No message left

## 2013-05-01 ENCOUNTER — Ambulatory Visit (AMBULATORY_SURGERY_CENTER): Payer: BC Managed Care – PPO

## 2013-05-01 VITALS — Ht 71.0 in | Wt 212.0 lb

## 2013-05-01 DIAGNOSIS — Z1211 Encounter for screening for malignant neoplasm of colon: Secondary | ICD-10-CM

## 2013-05-01 MED ORDER — SUPREP BOWEL PREP KIT 17.5-3.13-1.6 GM/177ML PO SOLN: 1.0000 | Freq: Once | ORAL | Status: DC

## 2013-05-05 ENCOUNTER — Ambulatory Visit: Payer: BC Managed Care – PPO | Admitting: Physician Assistant

## 2013-05-08 ENCOUNTER — Ambulatory Visit (INDEPENDENT_AMBULATORY_CARE_PROVIDER_SITE_OTHER): Payer: BC Managed Care – PPO | Admitting: Physician Assistant

## 2013-05-08 ENCOUNTER — Encounter: Payer: Self-pay | Admitting: Physician Assistant

## 2013-05-08 ENCOUNTER — Telehealth: Payer: Self-pay | Admitting: *Deleted

## 2013-05-08 VITALS — BP 120/78 | HR 70 | Ht 71.0 in | Wt 214.8 lb

## 2013-05-08 DIAGNOSIS — R195 Other fecal abnormalities: Secondary | ICD-10-CM

## 2013-05-08 DIAGNOSIS — Z1211 Encounter for screening for malignant neoplasm of colon: Secondary | ICD-10-CM

## 2013-05-08 DIAGNOSIS — Z7901 Long term (current) use of anticoagulants: Secondary | ICD-10-CM

## 2013-05-08 NOTE — Progress Notes (Signed)
Subjective:    Patient ID: Scott White, male    DOB: 01-Aug-1960, 53 y.o.   MRN: [id number]  HPI  Scott White  is a pleasant 53 year old white male new to GI who is referred for colonoscopy by Dr. Honor Loh medical care. Patient had a recent physical and was found to be Hemoccult-positive. He has no family history of colon cancer or polyps that he is aware of. He has no current GI complaints, specifically no abdominal  pain changes in bowel habits melena or hematochezia. His appetite has been fine, his weight has been stable, no dysphagia . He does have history of atrial fibrillation- has had prior cardioversion and is currently on Tikosyn. He is also maintained on Pradaxa. He has history of coronary artery disease is status post MI in 2004 and had a bare-metal stent to the RCA. He has an EF of 50%. Also with history of hypertension, hyperlipidemia, and sleep apnea.    Review of Systems  Constitutional: Negative.   HENT: Negative.   Eyes: Negative.   Respiratory: Negative.   Cardiovascular: Negative.   Gastrointestinal: Negative.   Endocrine: Negative.   Genitourinary: Negative.   Musculoskeletal: Negative.   Allergic/Immunologic: Negative.   Neurological: Negative.   Hematological: Negative.   Psychiatric/Behavioral: Negative.    Outpatient Prescriptions Prior to Visit  Medication Sig Dispense Refill  . dabigatran (PRADAXA) 150 MG CAPS capsule Take 1 capsule (150 mg total) by mouth 2 (two) times daily.  180 capsule  3  . lisinopril (PRINIVIL,ZESTRIL) 20 MG tablet Take 1 tablet (20 mg total) by mouth every evening.  30 tablet  5  . metFORMIN (GLUCOPHAGE) 500 MG tablet Take 1 tablet (500 mg total) by mouth 2 (two) times daily with a meal.  180 tablet  0  . metoprolol succinate (TOPROL-XL) 25 MG 24 hr tablet TAKE 1 TABLET BY MOUTH EVERY DAY  30 tablet  6  . Multiple Vitamins-Minerals (CENTRUM PO) Take 1 tablet by mouth 2 (two) times daily.       . niacin 100 MG tablet Take 100  mg by mouth daily with breakfast.        . nitroGLYCERIN (NITROSTAT) 0.4 MG SL tablet Place 0.4 mg under the tongue every 5 (five) minutes as needed. For chest pain.      . rosuvastatin (CRESTOR) 40 MG tablet Take 1 tablet (40 mg total) by mouth daily.  30 tablet  5  . SUPREP BOWEL PREP SOLN Take 1 kit by mouth once.  354 mL  0  . TIKOSYN 500 MCG capsule TAKE ONE CAPSULE TWICE A DAY  180 capsule  3   No facility-administered medications prior to visit.   No Known Allergies   Patient Active Problem List   Diagnosis Date Noted  . Sleep apnea 09/22/2010  . Encounter for long-term (current) use of anticoagulants 07/03/2010  . Long term current use of anticoagulant 05/31/2010  . DYSLIPIDEMIA 09/27/2008  . HYPERTRIGLYCERIDEMIA 02/11/2008  . ESSENTIAL HYPERTENSION, BENIGN 02/11/2008  . CORONARY ATHEROSCLEROSIS NATIVE CORONARY ARTERY 02/11/2008  . ATRIAL FIBRILLATION 02/11/2008   History  Substance Use Topics  . Smoking status: Never Smoker   . Smokeless tobacco: Never Used  . Alcohol Use: 0.6 oz/week    1 Cans of beer per week     Comment:  three times a week   family history includes Alzheimer's disease in his brother and father; Coronary artery disease in his mother; Diabetes in his brother and father; Heart disease in his brother and  mother; Heart failure in his mother; Hyperlipidemia in his brother, brother, and mother; Hypertension in his brother and mother. There is no history of Colon cancer, Pancreatic cancer, Rectal cancer, or Stomach cancer.     Objective:   Physical Exam  well-developed white male in no acute distress, pleasant blood pressure 120/78 pulse 70 height 5 foot 11 weight 214. HEENT; nontraumatic normocephalic EOMI PERRLA sclera anicteric, Supple; no JVD, Cardiovascular; regular rate and rhythm with S1-S2 no murmur or gallop, Pulmonary; clear bilaterally, Abdomen; soft nontender nondistended bowel sounds are active there is no palpable mass or hepatosplenomegaly, Rectal  ;exam not done, Extremities ;no clubbing cyanosis or edema skin warm and dry, Psych; mood and affect appropriate        Assessment & Plan:  #53  53 year old male with Hemoccult positive stool referred for colon neoplasia screening, otherwise asymptomatic. #2 chronic anticoagulation with Pradaxa #3 history of coronary artery disease status post MI 2004 and bare-metal stent to the RCA #4 atrial fibrillation #5 hypertension #6 hyperlipidemia #7 sleep apnea #8 EF 50%  Plan; patient will be scheduled for colonoscopy with Dr. Carlean Purl. Procedure discussed in detail with the patient and he is agreeable to proceed. We will obtain consent from Dr. Stanford Breed for patient to hold  Pradaxa for 3 days prior to the procedure

## 2013-05-08 NOTE — Telephone Encounter (Signed)
Hold pradaxa 2 days prior to procedure and resume the day after Scott White

## 2013-05-08 NOTE — Patient Instructions (Signed)
We routed a letter to Dr. Stanford Breed regarding the Pradaxa medication . We will call you once we hear from Dr. Stanford Breed. We called in the prescription for the Suprep for the colonoscopy prep to [facility] today. I spoke to the pharmacist. You have been scheduled for a colonoscopy with propofol. Please follow written instructions given to you at your visit today.

## 2013-05-08 NOTE — Telephone Encounter (Signed)
/  20/2015   RE: Scott White DOB: 1960-04-12 MRN: [id number]   Dear Kirk Ruths,    We have scheduled the above patient for an endoscopic procedure. Our records show that he is on anticoagulation therapy.   Please advise as to how long the patient may come off his therapy of Pradaxa prior to the procedure, which is scheduled for 05-15-2013.  Please fax back/ or route the completed form to [facility] at [phone].   Sincerely,    Amy Esterwood PA-C     Marisue Humble CMA

## 2013-05-08 NOTE — Progress Notes (Signed)
Agree with Ms. Esterwood's assessment and plan. Carl E. Gessner, MD, FACG   

## 2013-05-12 ENCOUNTER — Telehealth: Payer: Self-pay | Admitting: *Deleted

## 2013-05-12 NOTE — Telephone Encounter (Signed)
Called patient's cell phone and left him a message advising him we heard from Dr. Stanford Breed.  He wants the patient to hold the Pradaxa on 2-25, 2-26, 2-27 and resume it on 2-28.

## 2013-05-14 ENCOUNTER — Encounter: Payer: Self-pay | Admitting: Internal Medicine

## 2013-05-15 ENCOUNTER — Ambulatory Visit (AMBULATORY_SURGERY_CENTER): Payer: BC Managed Care – PPO | Admitting: Internal Medicine

## 2013-05-15 ENCOUNTER — Encounter: Payer: Self-pay | Admitting: Internal Medicine

## 2013-05-15 VITALS — BP 118/76 | HR 73 | Temp 96.7°F | Resp 18 | Ht 71.0 in | Wt 214.0 lb

## 2013-05-15 DIAGNOSIS — R195 Other fecal abnormalities: Secondary | ICD-10-CM

## 2013-05-15 DIAGNOSIS — K5730 Diverticulosis of large intestine without perforation or abscess without bleeding: Secondary | ICD-10-CM

## 2013-05-15 LAB — GLUCOSE, CAPILLARY
GLUCOSE-CAPILLARY: 104 mg/dL — AB (ref 70–99)
Glucose-Capillary: 97 mg/dL (ref 70–99)

## 2013-05-15 MED ORDER — SODIUM CHLORIDE 0.9 % IV SOLN: 500.0000 mL | INTRAVENOUS | Status: DC

## 2013-05-15 NOTE — Patient Instructions (Addendum)
The colonoscopy looked normal except for some diverticulosis. Diverticulosis is  common and not usually a problem. Please read the handout provided.  Next routine colonoscopy in 10 years - 2025  I appreciate the opportunity to care for you. Gatha Mayer, MD, FACG YOU HAD AN ENDOSCOPIC PROCEDURE TODAY AT [facility]: Refer to the procedure report that was given to you for any specific questions about what was found during the examination.  If the procedure report does not answer your questions, please call your gastroenterologist to clarify.  If you requested that your care partner not be given the details of your procedure findings, then the procedure report has been included in a sealed envelope for you to review at your convenience later.  YOU SHOULD EXPECT: Some feelings of bloating in the abdomen. Passage of more gas than usual.  Walking can help get rid of the air that was put into your GI tract during the procedure and reduce the bloating. If you had a lower endoscopy (such as a colonoscopy or flexible sigmoidoscopy) you may notice spotting of blood in your stool or on the toilet paper. If you underwent a bowel prep for your procedure, then you may not have a normal bowel movement for a few days.  DIET: Your first meal following the procedure should be a light meal and then it is ok to progress to your normal diet.  A half-sandwich or bowl of soup is an example of a good first meal.  Heavy or fried foods are harder to digest and may make you feel nauseous or bloated.  Likewise meals heavy in dairy and vegetables can cause extra gas to form and this can also increase the bloating.  Drink plenty of fluids but you should avoid alcoholic beverages for 24 hours.  ACTIVITY: Your care partner should take you home directly after the procedure.  You should plan to take it easy, moving slowly for the rest of the day.  You can resume normal activity the day after the procedure however you  should NOT DRIVE or use heavy machinery for 24 hours (because of the sedation medicines used during the test).    SYMPTOMS TO REPORT IMMEDIATELY: A gastroenterologist can be reached at any hour.  During normal business hours, 8:30 AM to 5:00 PM Monday through Friday, call [phone].  After hours and on weekends, please call the GI answering service at [phone] who will take a message and have the physician on call contact you.     Following upper endoscopy (EGD)  Vomiting of blood or coffee ground material  New chest pain or pain under the shoulder blades  Painful or persistently difficult swallowing  New shortness of breath  Fever of 100F or higher  Black, tarry-looking stools  FOLLOW UP: If any biopsies were taken you will be contacted by phone or by letter within the next 1-3 weeks.  Call your gastroenterologist if you have not heard about the biopsies in 3 weeks.  Our staff will call the home number listed on your records the next business day following your procedure to check on you and address any questions or concerns that you may have at that time regarding the information given to you following your procedure. This is a courtesy call and so if there is no answer at the home number and we have not heard from you through the emergency physician on call, we will assume that you have returned to your regular daily activities  without incident.  SIGNATURES/CONFIDENTIALITY: You and/or your care partner have signed paperwork which will be entered into your electronic medical record.  These signatures attest to the fact that that the information above on your After Visit Summary has been reviewed and is understood.  Full responsibility of the confidentiality of this discharge information lies with you and/or your care-partner.  Diverticulosis information.  Next colonoscopy 10 years-2025  Resume all meds including Pradaxa today.YOU HAD AN ENDOSCOPIC PROCEDURE TODAY AT [facility]: Refer to the procedure report that was given to you for any specific questions about what was found during the examination.  If the procedure report does not answer your questions, please call your gastroenterologist to clarify.  If you requested that your care partner not be given the details of your procedure findings, then the procedure report has been included in a sealed envelope for you to review at your convenience later.  YOU SHOULD EXPECT: Some feelings of bloating in the abdomen. Passage of more gas than usual.  Walking can help get rid of the air that was put into your GI tract during the procedure and reduce the bloating. If you had a lower endoscopy (such as a colonoscopy or flexible sigmoidoscopy) you may notice spotting of blood in your stool or on the toilet paper. If you underwent a bowel prep for your procedure, then you may not have a normal bowel movement for a few days.  DIET: Your first meal following the procedure should be a light meal and then it is ok to progress to your normal diet.  A half-sandwich or bowl of soup is an example of a good first meal.  Heavy or fried foods are harder to digest and may make you feel nauseous or bloated.  Likewise meals heavy in dairy and vegetables can cause extra gas to form and this can also increase the bloating.  Drink plenty of fluids but you should avoid alcoholic beverages for 24 hours.  ACTIVITY: Your care partner should take you home directly after the procedure.  You should plan to take it easy, moving slowly for the rest of the day.  You can resume normal activity the day after the procedure however you should NOT DRIVE or use heavy machinery for 24 hours (because of the sedation medicines used during the test).    SYMPTOMS TO REPORT IMMEDIATELY: A gastroenterologist can be reached at any hour.  During normal business hours, 8:30 AM to 5:00 PM Monday through Friday, call [phone].  After hours and on  weekends, please call the GI answering service at [phone] who will take a message and have the physician on call contact you.   Following lower endoscopy (colonoscopy or flexible sigmoidoscopy):  Excessive amounts of blood in the stool  Significant tenderness or worsening of abdominal pains  Swelling of the abdomen that is new, acute  Fever of 100F or higher    FOLLOW UP: If any biopsies were taken you will be contacted by phone or by letter within the next 1-3 weeks.  Call your gastroenterologist if you have not heard about the biopsies in 3 weeks.  Our staff will call the home number listed on your records the next business day following your procedure to check on you and address any questions or concerns that you may have at that time regarding the information given to you following your procedure. This is a courtesy call and so if there is no answer at the home number  and we have not heard from you through the emergency physician on call, we will assume that you have returned to your regular daily activities without incident.  SIGNATURES/CONFIDENTIALITY: You and/or your care partner have signed paperwork which will be entered into your electronic medical record.  These signatures attest to the fact that that the information above on your After Visit Summary has been reviewed and is understood.  Full responsibility of the confidentiality of this discharge information lies with you and/or your care-partner.

## 2013-05-15 NOTE — Op Note (Signed)
[place]  [address] [place], [address]   COLONOSCOPY PROCEDURE REPORT  PATIENT: Scott White, Scott White  MR#: [id number] BIRTHDATE: 02-Jun-1960 , 52  yrs. old GENDER: Male ENDOSCOPIST: Gatha Mayer, MD, [facility] REFERRED DJ:SHFWYOV Carlota Raspberry, M.D. PROCEDURE DATE:  05/15/2013 PROCEDURE:   Colonoscopy, diagnostic First Screening Colonoscopy - Avg.  risk and is 50 yrs.  old or older - No.  Prior Negative Screening - Now for repeat screening. N/A  History of Adenoma - Now for follow-up colonoscopy & has been > or = to 3 yrs.  N/A  Polyps Removed Today? No.  Recommend repeat exam, <10 yrs? No. ASA CLASS:   Class II INDICATIONS:heme-positive stool. MEDICATIONS: propofol (Diprivan) 200mg  IV, MAC sedation, administered by CRNA, and These medications were titrated to patient response per physician's verbal order  DESCRIPTION OF PROCEDURE:   After the risks benefits and alternatives of the procedure were thoroughly explained, informed consent was obtained.  A digital rectal exam revealed no abnormalities of the rectum, A digital rectal exam revealed no prostatic nodules, and A digital rectal exam revealed the prostate was not enlarged.   The LB [id number]  endoscope was introduced through the anus and advanced to the cecum, which was identified by both the appendix and ileocecal valve. No adverse events experienced.   The quality of the prep was excellent using Suprep  The instrument was then slowly withdrawn as the colon was fully examined.  COLON FINDINGS: Mild diverticulosis was noted in the sigmoid colon. The colon mucosa was otherwise normal.   A right colon retroflexion was performed.  Retroflexed views revealed no abnormalities. The time to cecum=3 minutes 02 seconds.  Withdrawal time=10 minutes 50 seconds.  The scope was withdrawn and the procedure completed. COMPLICATIONS: There were no complications.  ENDOSCOPIC IMPRESSION: 1.   Mild diverticulosis was  noted in the sigmoid colon 2.   The colon mucosa was otherwise normal - excellent prep  RECOMMENDATIONS: 1.  Repeat Colonscopy in 10 years - 2025 2.   Resume all medications including Pradaxa today   eSigned:  Gatha Mayer, MD, [facility] 05/15/2013 10:45 AM   cc: Merri Ray, MD and The Patient

## 2013-05-15 NOTE — Progress Notes (Signed)
A/ox3 pleased with MAC, report to Jane RN 

## 2013-05-18 ENCOUNTER — Telehealth: Payer: Self-pay | Admitting: *Deleted

## 2013-05-18 NOTE — Telephone Encounter (Signed)
  Follow up Call-  Call back number 05/15/2013  Post procedure Call Back phone  # [phone], [phone]  Permission to leave phone message Yes    [facility]

## 2013-05-23 ENCOUNTER — Other Ambulatory Visit: Payer: Self-pay | Admitting: Cardiology

## 2013-06-15 ENCOUNTER — Other Ambulatory Visit: Payer: Self-pay | Admitting: Physician Assistant

## 2013-06-25 IMAGING — CR DG CHEST 2V
2 series · 2 of 2 positions shown · non-contrast
Comparison: None.

CLINICAL DATA: Preadmission for atrial fibrillation.

CHEST - 2 VIEW

[w chest pa]
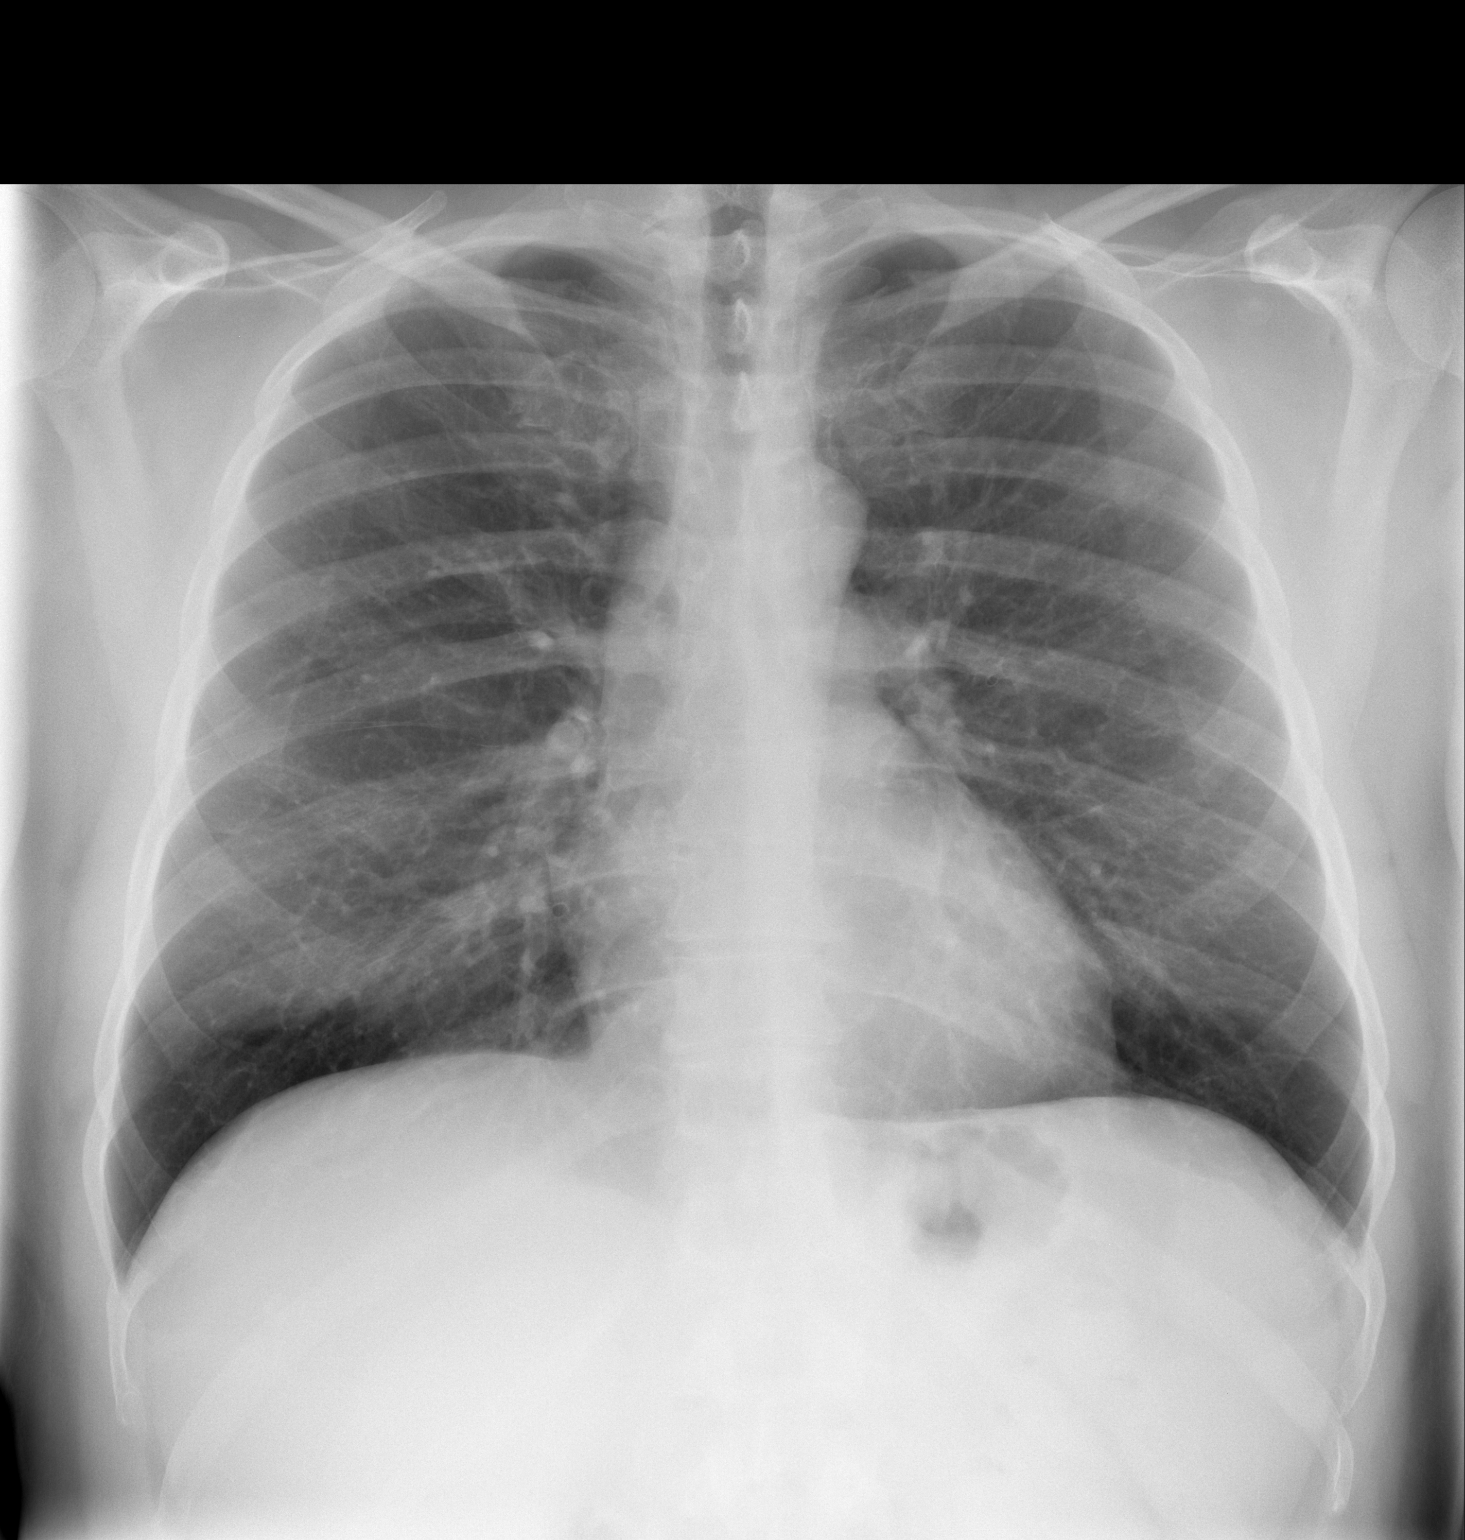

[w chest lat]
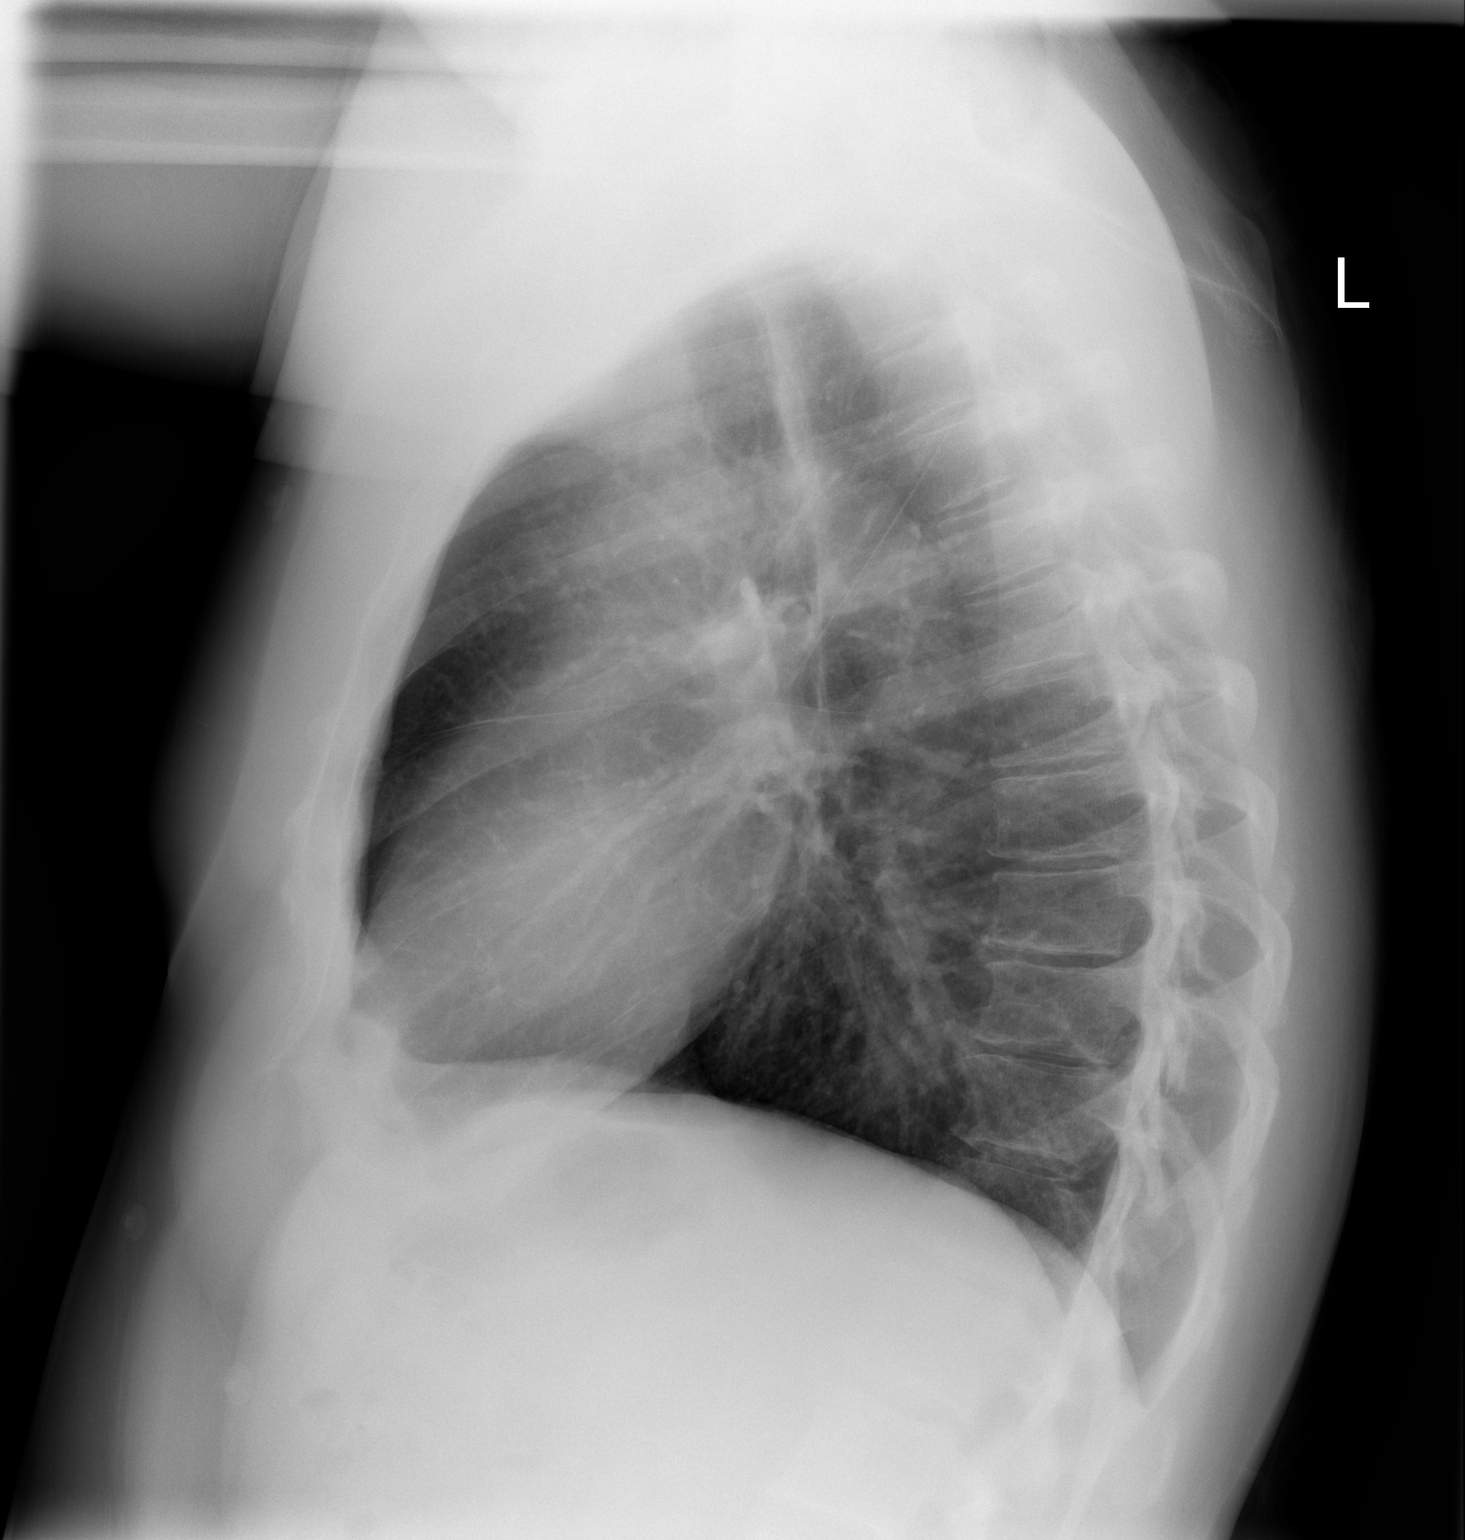

[2 of 2 positions shown; findings below may reference images not displayed]

FINDINGS: The heart size is normal.  The lungs are clear.  The
visualized soft tissues and bony thorax are unremarkable.
IMPRESSION: Negative chest.

## 2013-07-15 ENCOUNTER — Other Ambulatory Visit: Payer: Self-pay | Admitting: Physician Assistant

## 2013-07-29 ENCOUNTER — Other Ambulatory Visit: Payer: Self-pay

## 2013-07-29 MED ORDER — LISINOPRIL 20 MG PO TABS: 20.0000 mg | ORAL_TABLET | Freq: Every evening | ORAL | Status: DC

## 2013-08-05 ENCOUNTER — Other Ambulatory Visit: Payer: Self-pay

## 2013-08-05 MED ORDER — ROSUVASTATIN CALCIUM 40 MG PO TABS: 40.0000 mg | ORAL_TABLET | Freq: Every day | ORAL | Status: DC

## 2013-08-14 ENCOUNTER — Other Ambulatory Visit: Payer: Self-pay | Admitting: Physician Assistant

## 2013-09-01 ENCOUNTER — Other Ambulatory Visit: Payer: Self-pay | Admitting: *Deleted

## 2013-09-01 MED ORDER — DOFETILIDE 500 MCG PO CAPS: ORAL_CAPSULE | ORAL | Status: DC

## 2013-09-16 ENCOUNTER — Other Ambulatory Visit: Payer: Self-pay | Admitting: Physician Assistant

## 2013-10-05 ENCOUNTER — Other Ambulatory Visit: Payer: Self-pay | Admitting: Family Medicine

## 2013-10-30 ENCOUNTER — Other Ambulatory Visit: Payer: Self-pay | Admitting: Family Medicine

## 2013-11-05 ENCOUNTER — Ambulatory Visit (INDEPENDENT_AMBULATORY_CARE_PROVIDER_SITE_OTHER): Payer: BC Managed Care – PPO | Admitting: Cardiology

## 2013-11-05 ENCOUNTER — Encounter: Payer: Self-pay | Admitting: Cardiology

## 2013-11-05 VITALS — BP 118/76 | HR 72 | Ht 71.0 in | Wt 210.0 lb

## 2013-11-05 DIAGNOSIS — E785 Hyperlipidemia, unspecified: Secondary | ICD-10-CM

## 2013-11-05 DIAGNOSIS — I10 Essential (primary) hypertension: Secondary | ICD-10-CM

## 2013-11-05 DIAGNOSIS — I4891 Unspecified atrial fibrillation: Secondary | ICD-10-CM

## 2013-11-05 NOTE — Assessment & Plan Note (Signed)
Patient remains in sinus rhythm. Continue tikosyn and pradaxa. Check renal function, magnesium and hemoglobin.

## 2013-11-05 NOTE — Assessment & Plan Note (Signed)
Continue statin. Check lipids and liver. 

## 2013-11-05 NOTE — Assessment & Plan Note (Signed)
Continue statin. Not on aspirin given need for anticoagulations. 

## 2013-11-05 NOTE — Patient Instructions (Signed)
Your physician wants you to follow-up in: ONE YEAR WITH DR CRENSHAW You will receive a reminder letter in the mail two months in advance. If you don't receive a letter, please call our office to schedule the follow-up appointment.   Your physician recommends that you return for lab work WHEN FASTING 

## 2013-11-05 NOTE — Assessment & Plan Note (Signed)
Continue present blood pressure medications. Check potassium and renal function. 

## 2013-11-05 NOTE — Progress Notes (Signed)
HPI: FU atrial fibrillation, coronary disease, hypertension and hyperlipidemia. Patient has had previous PCI of his right coronary artery in the setting of an acute infarct in 2004. No obstructive disease in the left system. He has had atrial fibrillation and has undergone previous ablation. Last echocardiogram in April of 2013 showed normal LV function. Nuclear study in October of 2013 showed an ejection fraction of 50%. There was no ischemia. I last saw him in August 2014. Since that time, the patient denies any dyspnea on exertion, orthopnea, PND, pedal edema, syncope or chest pain. No bleeding.   Current Outpatient Prescriptions  Medication Sig Dispense Refill  . dabigatran (PRADAXA) 150 MG CAPS capsule Take 1 capsule (150 mg total) by mouth 2 (two) times daily.  180 capsule  3  . dofetilide (TIKOSYN) 500 MCG capsule TAKE ONE CAPSULE TWICE A DAY  180 capsule  0  . lisinopril (PRINIVIL,ZESTRIL) 20 MG tablet Take 1 tablet (20 mg total) by mouth every evening.  30 tablet  5  . metFORMIN (GLUCOPHAGE) 500 MG tablet TAKE 1 TABLET BY MOUTH TWICE A DAY **CALL DOCTOR FOR APPOINTMENT**  60 tablet  1  . metoprolol succinate (TOPROL-XL) 25 MG 24 hr tablet TAKE 1 TABLET BY MOUTH EVERY DAY  30 tablet  6  . Multiple Vitamins-Minerals (CENTRUM PO) Take 1 tablet by mouth 2 (two) times daily.       . nitroGLYCERIN (NITROSTAT) 0.4 MG SL tablet Place 0.4 mg under the tongue every 5 (five) minutes as needed. For chest pain.      . rosuvastatin (CRESTOR) 40 MG tablet Take 1 tablet (40 mg total) by mouth daily.  30 tablet  2   No current facility-administered medications for this visit.     Past Medical History  Diagnosis Date  . CAD (coronary artery disease)     a. Status post acute inferior wall myocardial infarction emergent bare metal stenting of the mid RCA, May 2004,  residual nonobstructive coronary artery disease Ejection fraction 60% by echocardiography 04/2007  . HTN (hypertension)   .  Hyperlipidemia   . Sleep apnea     followed by Dr Gwenette Greet  . Myocardial infarction   . Atrial fibrillation     a. Persistent, s/p afib ablation by JA 08/16/10;  b. s/p failed DCCV 05/2011  . CHF (congestive heart failure)   . Diabetes     Past Surgical History  Procedure Laterality Date  . Cardiac catheterization  2004     Successful PTCA and stent placement in the mid right   coronary artery with an extra 99% narrowing to 0% with placement of a 3.5 x  20 mm Express 2 stent with improvement of TIMI grade 1 flow to TIMI grade 3.  . Atrial ablatio  08/16/10    afib ablation by JA  . Cardioversion  06/15/2011    Procedure: CARDIOVERSION;  Surgeon: Fay Records, MD;  Location: [place];  Service: Cardiovascular;  Laterality: N/A;  . Tonsilectomy      History   Social History  . Marital Status: Single    Spouse Name: N/A    Number of Children: 0  . Years of Education: N/A   Occupational History  . Teacher, English as a foreign language    Social History Main Topics  . Smoking status: Never Smoker   . Smokeless tobacco: Never Used  . Alcohol Use: 0.6 oz/week    1 Cans of beer per week     Comment:  three times  a week  . Drug Use: No  . Sexual Activity: Yes   Other Topics Concern  . Not on file   Social History Narrative   Pt lives in [place] alone.  Single.   He is a Teacher, English as a foreign language for [employer]    ROS: no fevers or chills, productive cough, hemoptysis, dysphasia, odynophagia, melena, hematochezia, dysuria, hematuria, rash, seizure activity, orthopnea, PND, pedal edema, claudication. Remaining systems are negative.  Physical Exam: Well-developed well-nourished in no acute distress.  Skin is warm and dry.  HEENT is normal.  Neck is supple.  Chest is clear to auscultation with normal expansion.  Cardiovascular exam is regular rate and rhythm.  Abdominal exam nontender or distended. No masses palpated. Extremities show no edema. neuro grossly intact  ECG Sinus rhythm at a  rate of 72. No ST changes. Right axis deviation.

## 2013-11-13 ENCOUNTER — Other Ambulatory Visit: Payer: Self-pay

## 2013-11-13 MED ORDER — ROSUVASTATIN CALCIUM 40 MG PO TABS: 40.0000 mg | ORAL_TABLET | Freq: Every day | ORAL | Status: DC

## 2013-11-16 ENCOUNTER — Ambulatory Visit (INDEPENDENT_AMBULATORY_CARE_PROVIDER_SITE_OTHER): Payer: BC Managed Care – PPO | Admitting: Family Medicine

## 2013-11-16 ENCOUNTER — Encounter: Payer: Self-pay | Admitting: Family Medicine

## 2013-11-16 VITALS — BP 123/76 | HR 71 | Temp 98.0°F | Resp 16 | Ht 71.5 in | Wt 210.0 lb

## 2013-11-16 DIAGNOSIS — Z23 Encounter for immunization: Secondary | ICD-10-CM

## 2013-11-16 DIAGNOSIS — E785 Hyperlipidemia, unspecified: Secondary | ICD-10-CM

## 2013-11-16 DIAGNOSIS — G4733 Obstructive sleep apnea (adult) (pediatric): Secondary | ICD-10-CM

## 2013-11-16 DIAGNOSIS — E119 Type 2 diabetes mellitus without complications: Secondary | ICD-10-CM

## 2013-11-16 LAB — LIPID PANEL
Cholesterol: 201 mg/dL — ABNORMAL HIGH (ref 0–200)
HDL: 38 mg/dL — ABNORMAL LOW
LDL Cholesterol: 108 mg/dL — ABNORMAL HIGH (ref 0–99)
Total CHOL/HDL Ratio: 5.3 ratio
Triglycerides: 274 mg/dL — ABNORMAL HIGH
VLDL: 55 mg/dL — ABNORMAL HIGH (ref 0–40)

## 2013-11-16 LAB — COMPLETE METABOLIC PANEL WITHOUT GFR
ALT: 27 U/L (ref 0–53)
AST: 23 U/L (ref 0–37)
Albumin: 4.6 g/dL (ref 3.5–5.2)
Alkaline Phosphatase: 47 U/L (ref 39–117)
BUN: 12 mg/dL (ref 6–23)
CO2: 28 meq/L (ref 19–32)
Calcium: 9.7 mg/dL (ref 8.4–10.5)
Chloride: 102 meq/L (ref 96–112)
Creat: 0.95 mg/dL (ref 0.50–1.35)
GFR, Est African American: >89 mL/min
GFR, Est Non African American: >89 mL/min
Glucose, Bld: 121 mg/dL — ABNORMAL HIGH (ref 70–99)
Potassium: 5.3 meq/L (ref 3.5–5.3)
Sodium: 140 meq/L (ref 135–145)
Total Bilirubin: 0.6 mg/dL (ref 0.2–1.2)
Total Protein: 7 g/dL (ref 6.0–8.3)

## 2013-11-16 LAB — GLUCOSE, POCT (MANUAL RESULT ENTRY): POC Glucose: 108 mg/dl — AB (ref 70–99)

## 2013-11-16 LAB — POCT GLYCOSYLATED HEMOGLOBIN (HGB A1C): Hemoglobin A1C: 6.3

## 2013-11-16 MED ORDER — METFORMIN HCL ER 500 MG PO TB24: 500.0000 mg | ORAL_TABLET | Freq: Every day | ORAL | Status: DC

## 2013-11-16 NOTE — Progress Notes (Signed)
Subjective:  This chart was scribed for Scott Ray, MD by Donato Schultz, Medical Scribe. This patient was seen in Room 28 and the patient's care was started at 4:09 PM.   Patient ID: Scott White, male    DOB: 1960-09-29, 53 y.o.   MRN: [id number]  HPI HPI Comments: Scott White is a 53 y.o. male who presents to the [facility] for a medication refill.  He has a history of CAD, A-Fib, Hypertension, Hyperlipidemia, and OSA.  He is followed by Dr. Stanford Breed at cardiology.  He was last seen by Christell Faith, PA-C in December of last year for a physical.    Hyperglycemia - Diagnosed with DM with an A1c of 7.7 in December 2014.  He was started on Metformin 500mg  BID.  He has noticed a subjective decrease in his body fat since taking the medication.  He will sometimes feel dizzy in the morning after he has taken his Metformin.  He denies bloating, gas, diarrhea, and abdominal pain as associated symptoms.  Weight at that visit was 212 and he is 210 today.  He does not check his blood sugar levels at home.  He tries to go to the gym 2-3 times a week and when he cannot go to the gym he will walk for 2 miles.    Hyperlipidemia - He takes 40mg  of Crestor once daily.  His last lipid panel was taken in December 2014.  He has been experiencing intermittent bilateral knee pain.      OSA - His last sleep study was in June 2012 with an AHI of 24.  He is followed by Dr. Gwenette Greet.  He uses a cervical collar to sleep at night but does not have a CPAP machine at home.  He will experience palpitations at night but has not told his cardiologist about his symptoms.    Optho - He last saw Dr. Nicki Reaper before he was diagnosed with DM.  He will see him in January.    Dentist - He had a cavity filled recently and will see his dentist in October.  He sees his dentist twice a year.     Patient Active Problem List   Diagnosis Date Noted  . Sleep apnea 09/22/2010  . Encounter for long-term (current)  use of anticoagulants 07/03/2010  . Long term current use of anticoagulant 05/31/2010  . DYSLIPIDEMIA 09/27/2008  . HYPERTRIGLYCERIDEMIA 02/11/2008  . ESSENTIAL HYPERTENSION, BENIGN 02/11/2008  . CORONARY ATHEROSCLEROSIS NATIVE CORONARY ARTERY 02/11/2008  . ATRIAL FIBRILLATION 02/11/2008   Past Medical History  Diagnosis Date  . CAD (coronary artery disease)     a. Status post acute inferior wall myocardial infarction emergent bare metal stenting of the mid RCA, May 2004,  residual nonobstructive coronary artery disease Ejection fraction 60% by echocardiography 04/2007  . HTN (hypertension)   . Hyperlipidemia   . Sleep apnea     followed by Dr Gwenette Greet  . Myocardial infarction   . Atrial fibrillation     a. Persistent, s/p afib ablation by JA 08/16/10;  b. s/p failed DCCV 05/2011  . CHF (congestive heart failure)   . Diabetes    Past Surgical History  Procedure Laterality Date  . Cardiac catheterization  2004     Successful PTCA and stent placement in the mid right   coronary artery with an extra 99% narrowing to 0% with placement of a 3.5 x  20 mm Express 2 stent with improvement of TIMI grade 1  flow to TIMI grade 3.  . Atrial ablatio  08/16/10    afib ablation by JA  . Cardioversion  06/15/2011    Procedure: CARDIOVERSION;  Surgeon: Fay Records, MD;  Location: [place];  Service: Cardiovascular;  Laterality: N/A;  . Tonsilectomy     No Known Allergies Prior to Admission medications   Medication Sig Start Date End Date Taking? Authorizing Provider  dabigatran (PRADAXA) 150 MG CAPS capsule Take 1 capsule (150 mg total) by mouth 2 (two) times daily. 11/27/12  Yes Lelon Perla, MD  dofetilide (TIKOSYN) 500 MCG capsule TAKE ONE CAPSULE TWICE A DAY 09/01/13  Yes Lelon Perla, MD  lisinopril (PRINIVIL,ZESTRIL) 20 MG tablet Take 1 tablet (20 mg total) by mouth every evening. 07/29/13 07/29/14 Yes Lelon Perla, MD  metFORMIN (GLUCOPHAGE) 500 MG tablet TAKE 1 TABLET BY MOUTH TWICE A DAY  **CALL DOCTOR FOR APPOINTMENT**   Yes Heather M Marte, PA-C  metoprolol succinate (TOPROL-XL) 25 MG 24 hr tablet TAKE 1 TABLET BY MOUTH EVERY DAY 04/09/13  Yes Lelon Perla, MD  Multiple Vitamins-Minerals (CENTRUM PO) Take 1 tablet by mouth 2 (two) times daily.    Yes Historical Provider, MD  nitroGLYCERIN (NITROSTAT) 0.4 MG SL tablet Place 0.4 mg under the tongue every 5 (five) minutes as needed. For chest pain.   Yes Historical Provider, MD  rosuvastatin (CRESTOR) 40 MG tablet Take 1 tablet (40 mg total) by mouth daily. 11/13/13  Yes Lelon Perla, MD   History   Social History  . Marital Status: Single    Spouse Name: N/A    Number of Children: 0  . Years of Education: N/A   Occupational History  . Teacher, English as a foreign language    Social History Main Topics  . Smoking status: Never Smoker   . Smokeless tobacco: Never Used  . Alcohol Use: 0.6 oz/week    1 Cans of beer per week     Comment:  three times a week  . Drug Use: No  . Sexual Activity: Yes   Other Topics Concern  . Not on file   Social History Narrative   Pt lives in [place] alone.  Single.   He is a Teacher, English as a foreign language for [employer]     Review of Systems  Constitutional: Negative for appetite change and unexpected weight change.  Respiratory: Negative for apnea, chest tightness and shortness of breath.   Cardiovascular: Positive for palpitations. Negative for chest pain and leg swelling.  Gastrointestinal: Negative for abdominal pain and diarrhea.  Musculoskeletal: Positive for arthralgias.  Neurological: Positive for dizziness. Negative for weakness and light-headedness.     Objective:  Physical Exam  Vitals reviewed. Constitutional: He is oriented to person, place, and time. He appears well-developed and well-nourished.  HENT:  Head: Normocephalic and atraumatic.  Eyes: EOM are normal. Pupils are equal, round, and reactive to light.  Neck: No JVD present. Carotid bruit is not present.    Cardiovascular: Normal rate, regular rhythm and normal heart sounds.  Exam reveals no gallop and no friction rub.   No murmur heard. Pulmonary/Chest: Effort normal and breath sounds normal. No respiratory distress. He has no wheezes. He has no rales.  Abdominal: Soft. There is no tenderness.  Musculoskeletal: He exhibits no edema.  Neurological: He is alert and oriented to person, place, and time.  Skin: Skin is warm and dry.  Psychiatric: He has a normal mood and affect.    Results for orders placed in visit  on 11/16/13  GLUCOSE, POCT (MANUAL RESULT ENTRY)      Result Value Ref Range   POC Glucose 108 (*) 70 - 99 mg/dl  POCT GLYCOSYLATED HEMOGLOBIN (HGB A1C)      Result Value Ref Range   Hemoglobin A1C 6.3      Filed Vitals:   11/16/13 1509  BP: 123/76  Pulse: 71  Temp: 98 F (36.7 C)  TempSrc: Oral  Resp: 16  Height: 5' 11.5" (1.816 m)  Weight: 210 lb (95.255 kg)  SpO2: 98%   Assessment & Plan:   Earsel Shouse Medal is a 53 y.o. male Need for prophylactic vaccination with combined diphtheria-tetanus-pertussis (DTP) vaccine - Plan: Tdap vaccine greater than or equal to 7yo IM given.   Type 2 diabetes mellitus without complication - Plan: POCT glucose (manual entry), POCT glycosylated hemoglobin (Hb A1C), COMPLETE METABOLIC PANEL WITH GFR, Microalbumin, urine, metFORMIN (GLUCOPHAGE XR) 500 MG 24 hr tablet  -much improved. Commended on his efforts, and will try lower dose of metformin 500mg  QD. Recheck 3 months.   Other and unspecified hyperlipidemia - Plan: Lipid panel  -continue Crestor at same dose, lipids pending.   OSA (obstructive sleep apnea) - Plan: COMPLETE METABOLIC PANEL WITH GFR  -advised to call sleep specialist to arrange CPAP or repeat eval if needed and if palpitations persist at night - call cardiologist. ER/RTC precautions.   Meds ordered this encounter  Medications  . metFORMIN (GLUCOPHAGE XR) 500 MG 24 hr tablet    Sig: Take 1 tablet (500 mg total)  by mouth daily with breakfast.    Dispense:  90 tablet    Refill:  1   Patient Instructions  New dose of metformin - once per day.  Check blood sugars about 1x/week at different times as discussed. You should receive a call or letter about your lab results within the next week to 10 days.      I personally performed the services described in this documentation, which was scribed in my presence. The recorded information has been reviewed and considered, and addended by me as needed.

## 2013-11-16 NOTE — Patient Instructions (Signed)
New dose of metformin - once per day.  Check blood sugars about 1x/week at different times as discussed. You should receive a call or letter about your lab results within the next week to 10 days.

## 2013-11-17 LAB — MICROALBUMIN, URINE: Microalb, Ur: 0.5 mg/dL (ref 0.00–1.89)

## 2013-11-29 ENCOUNTER — Other Ambulatory Visit: Payer: Self-pay | Admitting: Physician Assistant

## 2013-12-01 ENCOUNTER — Other Ambulatory Visit: Payer: Self-pay | Admitting: *Deleted

## 2013-12-01 MED ORDER — DABIGATRAN ETEXILATE MESYLATE 150 MG PO CAPS: 150.0000 mg | ORAL_CAPSULE | Freq: Two times a day (BID) | ORAL | Status: DC

## 2013-12-03 ENCOUNTER — Other Ambulatory Visit: Payer: Self-pay | Admitting: *Deleted

## 2013-12-03 MED ORDER — METOPROLOL SUCCINATE ER 25 MG PO TB24: ORAL_TABLET | ORAL | Status: DC

## 2013-12-13 ENCOUNTER — Other Ambulatory Visit: Payer: Self-pay | Admitting: Cardiology

## 2013-12-13 ENCOUNTER — Encounter: Payer: Self-pay | Admitting: Family Medicine

## 2013-12-14 ENCOUNTER — Other Ambulatory Visit: Payer: Self-pay | Admitting: *Deleted

## 2013-12-14 MED ORDER — DOFETILIDE 500 MCG PO CAPS: ORAL_CAPSULE | ORAL | Status: DC

## 2013-12-15 MED ORDER — DOFETILIDE 500 MCG PO CAPS: ORAL_CAPSULE | ORAL | Status: DC

## 2013-12-15 NOTE — Telephone Encounter (Signed)
Called LM for pt to RTC-check mychart message to RTC in regards to his concern to be evaluated.

## 2013-12-17 ENCOUNTER — Ambulatory Visit (INDEPENDENT_AMBULATORY_CARE_PROVIDER_SITE_OTHER): Payer: BC Managed Care – PPO | Admitting: Family Medicine

## 2013-12-17 VITALS — BP 130/62 | HR 72 | Temp 97.8°F | Resp 16 | Ht 72.0 in | Wt 213.0 lb

## 2013-12-17 DIAGNOSIS — R1030 Lower abdominal pain, unspecified: Secondary | ICD-10-CM

## 2013-12-17 DIAGNOSIS — K4090 Unilateral inguinal hernia, without obstruction or gangrene, not specified as recurrent: Secondary | ICD-10-CM

## 2013-12-17 DIAGNOSIS — R1031 Right lower quadrant pain: Secondary | ICD-10-CM

## 2013-12-17 NOTE — Progress Notes (Signed)
Subjective:    Patient ID: Scott White, male    DOB: 24-May-1960, 53 y.o.   MRN: [id number] This chart was scribed for Scott Agreste, MD by Martinique Peace, ED Scribe. The patient was seen in [facility]. The patient's care was started at 7:31 PM.  HPI HPI Comments: Here for evaluation of abdominal pain. An e-mail was sent through [facility] regarding pain in groin for past 4 weeks with pain into lower back and right hip area. Advised to evaluate in office.   Scott White is a 53 y.o. male who presents to the Emergency Department complaining of lower abdominal pain with associated right testicular pain onset 3 weeks ago. Pt states that he was doing some incline sit-ups and felt like he pulled something in his groin. He states that he switched to wearing briefs compared to his usual loose boxers, to provide affected area with more support but admits that the he only experienced even further discomfort. He states that he wears baggy shorts with boxer briefs and a jockstrap when he works out. He notes experiencing pain early in the mornings around 2 AM or 3 AM that will cause him to wake up out of his sleep. Pt describes episodes as a "dull" pain followed by a "sharp" pain but explains that once he sits up, the pain immediately subsides. Pt states that during 2nd week, he began experiencing lower back pain that radiated into his right hip. Pt goes on to state that testicle feels "heavy". He denies any urinary issues or history of kidney stones.   He further denies any blood or discoloration in semen. Pt reports improvement in back and hip but still reports discomfort in testicle. Pt states that he isn't currently sexual active. He adds that his last new sexual partner was about about 6 months ago but states that he always uses condoms. Pt also denies any pain felt while ejaculating from that testicle but reports experiencing "funny" sensation that traces back to testicle. He additonally denies any pain to back  of testicle, fever, nausea, or vomiting. He denies any discharge from penis or rashes in affected area.   Patient Active Problem List   Diagnosis Date Noted  . Sleep apnea 09/22/2010  . Encounter for long-term (current) use of anticoagulants 07/03/2010  . Long term current use of anticoagulant 05/31/2010  . DYSLIPIDEMIA 09/27/2008  . HYPERTRIGLYCERIDEMIA 02/11/2008  . ESSENTIAL HYPERTENSION, BENIGN 02/11/2008  . CORONARY ATHEROSCLEROSIS NATIVE CORONARY ARTERY 02/11/2008  . ATRIAL FIBRILLATION 02/11/2008   Past Medical History  Diagnosis Date  . CAD (coronary artery disease)     a. Status post acute inferior wall myocardial infarction emergent bare metal stenting of the mid RCA, May 2004,  residual nonobstructive coronary artery disease Ejection fraction 60% by echocardiography 04/2007  . HTN (hypertension)   . Hyperlipidemia   . Sleep apnea     followed by Dr Gwenette Greet  . Myocardial infarction   . Atrial fibrillation     a. Persistent, s/p afib ablation by [facility] 08/16/10;  b. s/p failed DCCV 05/2011  . CHF (congestive heart failure)   . Diabetes    Past Surgical History  Procedure Laterality Date  . Cardiac catheterization  2004     Successful PTCA and stent placement in the mid right   coronary artery with an extra 99% narrowing to 0% with placement of a 3.5 x  20 mm Express 2 stent with improvement of TIMI grade 1 flow to TIMI grade 3.  [name]  Atrial ablatio  08/16/10    afib ablation by [facility]  . Cardioversion  06/15/2011    Procedure: CARDIOVERSION;  Surgeon: Fay Records, MD;  Location: [place];  Service: Cardiovascular;  Laterality: N/A;  . Tonsilectomy     No Known Allergies Prior to Admission medications   Medication Sig Start Date End Date Taking? Authorizing Provider  dabigatran (PRADAXA) 150 MG CAPS capsule Take 1 capsule (150 mg total) by mouth 2 (two) times daily. 12/01/13  Yes Lelon Perla, MD  dofetilide (TIKOSYN) 500 MCG capsule TAKE ONE CAPSULE TWICE A DAY 12/14/13  Yes Lelon Perla, MD  dofetilide (TIKOSYN) 500 MCG capsule TAKE ONE CAPSULE TWICE A DAY 12/15/13  Yes Lelon Perla, MD  lisinopril (PRINIVIL,ZESTRIL) 20 MG tablet Take 1 tablet (20 mg total) by mouth every evening. 07/29/13 07/29/14 Yes Lelon Perla, MD  metFORMIN (GLUCOPHAGE XR) 500 MG 24 hr tablet Take 1 tablet (500 mg total) by mouth daily with breakfast. 11/16/13  Yes Scott Agreste, MD  metoprolol succinate (TOPROL-XL) 25 MG 24 hr tablet TAKE 1 TABLET BY MOUTH EVERY DAY 12/03/13  Yes Lelon Perla, MD  Multiple Vitamins-Minerals (CENTRUM PO) Take 1 tablet by mouth 2 (two) times daily.    Yes Historical Provider, MD  nitroGLYCERIN (NITROSTAT) 0.4 MG SL tablet Place 0.4 mg under the tongue every 5 (five) minutes as needed. For chest pain.   Yes Historical Provider, MD  rosuvastatin (CRESTOR) 40 MG tablet Take 1 tablet (40 mg total) by mouth daily. 11/13/13  Yes Lelon Perla, MD   History   Social History  . Marital Status: Single    Spouse Name: N/A    Number of Children: 0  . Years of Education: N/A   Occupational History  . Teacher, English as a foreign language    Social History Main Topics  . Smoking status: Never Smoker   . Smokeless tobacco: Never Used  . Alcohol Use: 0.6 oz/week    1 Cans of beer per week     Comment:  three times a week  . Drug Use: No  . Sexual Activity: Yes   Other Topics Concern  . Not on file   Social History Narrative   Pt lives in [place] alone.  Single.   He is a Teacher, English as a foreign language for [employer]      Review of Systems  Constitutional: Negative for fever, fatigue and unexpected weight change.  Eyes: Negative for visual disturbance.  Respiratory: Negative for cough, chest tightness and shortness of breath.   Cardiovascular: Negative for chest pain, palpitations and leg swelling.  Gastrointestinal: Positive for abdominal pain. Negative for nausea, vomiting and blood in stool.  Genitourinary: Positive for testicular pain. Negative for dysuria,  urgency, frequency, hematuria, decreased urine volume, discharge, penile swelling, scrotal swelling and difficulty urinating.  Musculoskeletal: Positive for back pain (lower).       Right hip pain  Neurological: Negative for dizziness, light-headedness and headaches.       Objective:   Physical Exam  Vitals reviewed. Constitutional: He is oriented to person, place, and time. He appears well-developed and well-nourished.  HENT:  Head: Normocephalic and atraumatic.  Eyes: EOM are normal. Pupils are equal, round, and reactive to light.  Neck: No JVD present. Carotid bruit is not present.  Cardiovascular: Normal rate, regular rhythm and normal heart sounds.   No murmur heard. Pulmonary/Chest: Effort normal and breath sounds normal. He has no rales.  Abdominal: Soft. He exhibits no distension.  There is no tenderness.  Genitourinary: Right testis shows no tenderness. Left testis shows no tenderness. No discharge found.  Possible right inguinal hernia with cough.   Musculoskeletal: He exhibits no edema.  No CVA tenderness. L-Spine has full ROM. No reproduction of pain with exam. Negative straight leg raise.   Neurological: He is alert and oriented to person, place, and time. He displays no Babinski's sign on the right side. He displays no Babinski's sign on the left side.  Reflex Scores:      Patellar reflexes are 2+ on the right side and 2+ on the left side.      Achilles reflexes are 2+ on the right side and 2+ on the left side. Skin: Skin is warm and dry.  Psychiatric: He has a normal mood and affect.     Medications - No data to display Filed Vitals:   12/17/13 1738  BP: 130/62  Pulse: 72  Temp: 97.8 F (36.6 C)  TempSrc: Oral  Resp: 16  Height: 6' (1.829 m)  Weight: 213 lb (96.616 kg)  SpO2: 98%        Assessment & Plan:   Seanpaul Preece Quizon is a 53 y.o. male Right groin pain - Plan: Ambulatory referral to General Surgery  Right inguinal hernia - Plan: Ambulatory  referral to General Surgery  Possible R inguinal hernia.  Testicles and epididymis nontender on exam. Discussed option of scrotal ultrasound, but he will see general surgeon first for possible R inguinal hernia eval, but if not found to have hernia, next step would be scrotal ultrasound. If any pain into testicle, consider epididymitis and rtc for eval/medication. Understanding expressed.   No orders of the defined types were placed in this encounter.   Patient Instructions  We will refer you to surgery to evaluate for a possible hernia.  Avoid straining until seen by surgeon.  If pain in testicle, or behind testicle - may need other testing or ultrasound as discussed.  Return to the clinic or go to the nearest emergency room if any of your symptoms worsen or new symptoms occur. Hernia A hernia occurs when an internal organ pushes out through a weak spot in the abdominal wall. Hernias most commonly occur in the groin and around the navel. Hernias often can be pushed back into place (reduced). Most hernias tend to get worse over time. Some abdominal hernias can get stuck in the opening (irreducible or incarcerated hernia) and cannot be reduced. An irreducible abdominal hernia which is tightly squeezed into the opening is at risk for impaired blood supply (strangulated hernia). A strangulated hernia is a medical emergency. Because of the risk for an irreducible or strangulated hernia, surgery may be recommended to repair a hernia. CAUSES   Heavy lifting.  Prolonged coughing.  Straining to have a bowel movement.  A cut (incision) made during an abdominal surgery. HOME CARE INSTRUCTIONS   Bed rest is not required. You may continue your normal activities.  Avoid lifting more than 10 pounds (4.5 kg) or straining.  Cough gently. If you are a smoker it is best to stop. Even the best hernia repair can break down with the continual strain of coughing. Even if you do not have your hernia repaired, a  cough will continue to aggravate the problem.  Do not wear anything tight over your hernia. Do not try to keep it in with an outside bandage or truss. These can damage abdominal contents if they are trapped within the hernia sac.  Eat a normal diet.  Avoid constipation. Straining over long periods of time will increase hernia size and encourage breakdown of repairs. If you cannot do this with diet alone, stool softeners may be used. SEEK IMMEDIATE MEDICAL CARE IF:   You have a fever.  You develop increasing abdominal pain.  You feel nauseous or vomit.  Your hernia is stuck outside the abdomen, looks discolored, feels hard, or is tender.  You have any changes in your bowel habits or in the hernia that are unusual for you.  You have increased pain or swelling around the hernia.  You cannot push the hernia back in place by applying gentle pressure while lying down. MAKE SURE YOU:   Understand these instructions.  Will watch your condition.  Will get help right away if you are not doing well or get worse. Document Released: 03/05/2005 Document Revised: 05/28/2011 Document Reviewed: 10/23/2007 [facility] Patient Information 2015 [place], [place]. This information is not intended to replace advice given to you by your health care provider. Make sure you discuss any questions you have with your health care provider.     I personally performed the services described in this documentation, which was scribed in my presence. The recorded information has been reviewed and considered, and addended by me as needed.

## 2013-12-17 NOTE — Patient Instructions (Signed)
We will refer you to surgery to evaluate for a possible hernia.  Avoid straining until seen by surgeon.  If pain in testicle, or behind testicle - may need other testing or ultrasound as discussed.  Return to the clinic or go to the nearest emergency room if any of your symptoms worsen or new symptoms occur. Hernia A hernia occurs when an internal organ pushes out through a weak spot in the abdominal wall. Hernias most commonly occur in the groin and around the navel. Hernias often can be pushed back into place (reduced). Most hernias tend to get worse over time. Some abdominal hernias can get stuck in the opening (irreducible or incarcerated hernia) and cannot be reduced. An irreducible abdominal hernia which is tightly squeezed into the opening is at risk for impaired blood supply (strangulated hernia). A strangulated hernia is a medical emergency. Because of the risk for an irreducible or strangulated hernia, surgery may be recommended to repair a hernia. CAUSES   Heavy lifting.  Prolonged coughing.  Straining to have a bowel movement.  A cut (incision) made during an abdominal surgery. HOME CARE INSTRUCTIONS   Bed rest is not required. You may continue your normal activities.  Avoid lifting more than 10 pounds (4.5 kg) or straining.  Cough gently. If you are a smoker it is best to stop. Even the best hernia repair can break down with the continual strain of coughing. Even if you do not have your hernia repaired, a cough will continue to aggravate the problem.  Do not wear anything tight over your hernia. Do not try to keep it in with an outside bandage or truss. These can damage abdominal contents if they are trapped within the hernia sac.  Eat a normal diet.  Avoid constipation. Straining over long periods of time will increase hernia size and encourage breakdown of repairs. If you cannot do this with diet alone, stool softeners may be used. SEEK IMMEDIATE MEDICAL CARE IF:   You have  a fever.  You develop increasing abdominal pain.  You feel nauseous or vomit.  Your hernia is stuck outside the abdomen, looks discolored, feels hard, or is tender.  You have any changes in your bowel habits or in the hernia that are unusual for you.  You have increased pain or swelling around the hernia.  You cannot push the hernia back in place by applying gentle pressure while lying down. MAKE SURE YOU:   Understand these instructions.  Will watch your condition.  Will get help right away if you are not doing well or get worse. Document Released: 03/05/2005 Document Revised: 05/28/2011 Document Reviewed: 10/23/2007 [facility] Patient Information 2015 [place], [place]. This information is not intended to replace advice given to you by your health care provider. Make sure you discuss any questions you have with your health care provider.

## 2014-01-04 ENCOUNTER — Other Ambulatory Visit (INDEPENDENT_AMBULATORY_CARE_PROVIDER_SITE_OTHER): Payer: Self-pay | Admitting: Surgery

## 2014-01-04 NOTE — H&P (Signed)
Scott White 01/04/2014 1:35 PM Location: [facility] Patient #: [id number] DOB: 1960/12/15 Undefined / Language: Scott White / Race: Undefined Male  History of Present Illness Scott Hector MD; 01/04/2014 2:28 PM) Patient words: eval RIh.  The patient is a 53 year old male who presents with an inguinal hernia. Pleasant active male. Diagnosed with atrial fibrillation. Stenting. Cardioversion. Kept in sinus rhythm with medications. Followed by Scott White with cardiology. Recently diagnosed with diabetes. Changed diet. Increased exercise. He noted sharp groin pain after a workout. Hold off for a week and started to go back to [place]. Again felt) pain and discomfort. He was concerned. He soft primary care physician. Scott White was concerned for an inguinal hernia. Therefore he requested surgical consultation. Patient notes some pulsation radiating down the right testicle and groin with activity. Normally can walk 2 miles without difficulty. Has bowel movements once or twice a day. Colonoscopy last year looked normal. No problems with urination or defecation. No history would infections. No history of abdominal, hernia, nor urologic White   Other Problems Scott White, CMA; 01/04/2014 1:36 PM) Atrial Fibrillation Diabetes Mellitus High blood pressure Hypercholesterolemia Myocardial infarction Sleep Apnea  Past Surgical History Scott White, CMA; 01/04/2014 1:36 PM) Carotid Artery White Right. Tonsillectomy  Diagnostic Studies History Scott White, CMA; 01/04/2014 1:36 PM) Colonoscopy within last year  Allergies (Scott White, CMA; 01/04/2014 1:35 PM) No Known Drug Allergies10/19/2015  Medication History (Scott White, CMA; 01/04/2014 1:36 PM) Crestor (40MG  Tablet, Oral daily) Active. Lisinopril (20MG  Tablet, Oral daily) Active. Pradaxa (150MG  Capsule, Oral daily) Active. Tikosyn (500MCG Capsule, Oral daily)  Active. MetFORMIN HCl ER (500MG  Tablet ER 24HR, Oral daily) Active. Metoprolol Tartrate (25MG  Tablet, Oral daily) Active.  Social History Scott White, CMA; 01/04/2014 1:36 PM) Alcohol use Moderate alcohol use. Caffeine use Tea. No drug use Tobacco use Never smoker.  Family History Scott White, CMA; 01/04/2014 1:36 PM) Alcohol Abuse Brother. Arthritis Father. Depression Brother. Diabetes Mellitus Father. Heart Disease Brother, Mother. Heart disease in male family member before age 53 Hypertension Brother, Mother.  Review of Systems (Scott White; 01/04/2014 1:36 PM) General Present- Fatigue. Not Present- Appetite Loss, Chills, Fever, Night Sweats, Weight Gain and Weight Loss. Skin Not Present- Change in Wart/Mole, Dryness, Hives, Jaundice, New Lesions, Non-Healing Wounds, Rash and Ulcer. HEENT Present- Wears glasses/contact lenses. Not Present- Earache, Hearing Loss, Hoarseness, Nose Bleed, Oral Ulcers, Ringing in the Ears, Seasonal Allergies, Sinus Pain, Sore Throat, Visual Disturbances and Yellow Eyes. Respiratory Not Present- Bloody sputum, Chronic Cough, Difficulty Breathing, Snoring and Wheezing. Breast Not Present- Breast Mass, Breast Pain, Nipple Discharge and Skin Changes. Cardiovascular Not Present- Chest Pain, Difficulty Breathing Lying Down, Leg Cramps, Palpitations, Rapid Heart Rate, Shortness of Breath and Swelling of Extremities. Gastrointestinal Not Present- Abdominal Pain, Bloating, Bloody Stool, Change in Bowel Habits, Chronic diarrhea, Constipation, Difficulty Swallowing, Excessive gas, Gets full quickly at meals, Hemorrhoids, Indigestion, Nausea, Rectal Pain and Vomiting. Male Genitourinary Not Present- Blood in Urine, Change in Urinary Stream, Frequency, Impotence, Nocturia, Painful Urination, Urgency and Urine Leakage. Musculoskeletal Not Present- Back Pain, Joint Pain, Joint Stiffness, Muscle Pain, Muscle Weakness and Swelling of  Extremities. Neurological Not Present- Decreased Memory, Fainting, Headaches, Numbness, Seizures, Tingling, Tremor, Trouble walking and Weakness. Psychiatric Not Present- Anxiety, Bipolar, Change in Sleep Pattern, Depression, Fearful and Frequent crying. Endocrine Present- New Diabetes. Not Present- Cold Intolerance, Excessive Hunger, Hair Changes, Heat Intolerance and Hot flashes. Hematology Not Present- Easy Bruising, Excessive bleeding, Gland problems, HIV and Persistent  Infections.   Vitals (Scott White CMA; 01/04/2014 1:38 PM) 01/04/2014 1:37 PM Weight: 208 lb Height: 71in Body Surface Area: 2.17 m Body Mass Index: 29.01 kg/m Temp.: 98.62F(Temporal)  Pulse: 75 (Regular)  BP: 142/80 (Sitting, Left Arm, Standard)    Physical Exam Scott Hector MD; 01/04/2014 2:21 PM) General Mental Status-Alert. General Appearance-Not in acute distress, Not Sickly. Orientation-Oriented X3. Hydration-Well hydrated. Voice-Normal.  Integumentary Global Assessment Upon inspection and palpation of skin surfaces of the - Axillae: non-tender, no inflammation or ulceration, no drainage. and Distribution of scalp and body hair is normal. General Characteristics Temperature - normal warmth is noted.  Head and Neck Head-normocephalic, atraumatic with no lesions or palpable masses. Face Global Assessment - atraumatic, no absence of expression. Neck Global Assessment - no abnormal movements, no bruit auscultated on the right, no bruit auscultated on the left, no decreased range of motion, non-tender. Trachea-midline. Thyroid Gland Characteristics - non-tender.  Eye Eyeball - Left-Extraocular movements intact, No Nystagmus. Eyeball - Right-Extraocular movements intact, No Nystagmus. Cornea - Left-No Hazy. Cornea - Right-No Hazy. Sclera/Conjunctiva - Left-No scleral icterus, No Discharge. Sclera/Conjunctiva - Right-No scleral icterus, No Discharge. Pupil  - Left-Direct reaction to light normal. Pupil - Right-Direct reaction to light normal.  ENMT Ears Pinna - Left - no drainage observed, no generalized tenderness observed. Right - no drainage observed, no generalized tenderness observed. Nose and Sinuses External Inspection of the Nose - no destructive lesion observed. Inspection of the nares - Left - quiet respiration. Right - quiet respiration. Mouth and Throat Lips - Upper Lip - no fissures observed, no pallor noted. Lower Lip - no fissures observed, no pallor noted. Nasopharynx - no discharge present. Oral Cavity/Oropharynx - Tongue - no dryness observed. Oral Mucosa - no cyanosis observed. Hypopharynx - no evidence of airway distress observed.  Chest and Lung Exam Inspection Movements - Normal and Symmetrical. Accessory muscles - No use of accessory muscles in breathing. Palpation Palpation of the chest reveals - Non-tender. Auscultation Breath sounds - Normal and Clear.  Cardiovascular Auscultation Rhythm - Regular. Murmurs & Other Heart Sounds - Auscultation of the heart reveals - No Murmurs and No Systolic Clicks.  Abdomen Inspection Inspection of the abdomen reveals - No Visible peristalsis and No Abnormal pulsations. Hernias - Inguinal hernia - Left - Reducible. Note: Small bulge w Valsalva c/w probable small RIH. Right - Reducible(small but definite RIH out all the time). Umbilicus - No Bleeding, No Urine drainage. Palpation/Percussion Palpation and Percussion of the abdomen reveal - Soft, Non Tender, No Rebound tenderness, No Rigidity (guarding) and No Cutaneous hyperesthesia.  Male Genitourinary Evaluation of genitourinary system reveals-scrotum non-tender, no masses, normal testes, no palpable masses, normal penis and normal anus and perineum, no lesions. Sexual Maturity Tanner 5 - Adult hair pattern and Adult penile size and shape.  Peripheral Vascular Upper Extremity Inspection - Left - No Cyanotic nailbeds,  Not Ischemic. Right - No Cyanotic nailbeds, Not Ischemic.  Neurologic Neurologic evaluation reveals -normal attention span and ability to concentrate, able to name objects and repeat phrases. Appropriate fund of knowledge , normal sensation and normal coordination. Mental Status Affect - not angry, not paranoid. Cranial Nerves-Normal Bilaterally. Gait-Normal.  Neuropsychiatric Mental status exam performed with findings of-able to articulate well with normal speech/language, rate, volume and coherence, thought content normal with ability to perform basic computations and apply abstract reasoning and no evidence of hallucinations, delusions, obsessions or homicidal/suicidal ideation.  Musculoskeletal Global Assessment Spine, Ribs and Pelvis - no instability, subluxation or  laxity. Right Upper Extremity - no instability, subluxation or laxity.  Lymphatic Head & Neck  General Head & Neck Lymphatics: Bilateral - Description - No Localized lymphadenopathy. Axillary  General Axillary Region: Bilateral - Description - No Localized lymphadenopathy. Femoral & Inguinal  Generalized Femoral & Inguinal Lymphatics: Left - Description - No Localized lymphadenopathy. Right - Description - No Localized lymphadenopathy.    Assessment & Plan Scott Hector MD; 01/04/2014 2:25 PM) BILATERAL INGUINAL HERNIA WITHOUT OBSTRUCTION OR GANGRENE, RECURRENCE NOT SPECIFIED (550.92  K40.20) Current Plans  Schedule for White  I think he would benefit from surgical repair. Given probable bilateral nature, plan laparoscopic approach. Concern with bleeding but to recovery in less pain. He wishes to proceed with a choice.  Cardiac clearance - See if OK for White & OK to come off Pradaxa (?usually 3 days). Pt with great exercise tolerance but wish to be thorough  Discussed with him:  The anatomy & physiology of the abdominal wall and pelvic floor was discussed. The pathophysiology of hernias in  the inguinal and pelvic region was discussed. Natural history risks such as progressive enlargement, pain, incarceration & strangulation was discussed. Contributors to complications such as smoking, obesity, diabetes, prior White, etc were discussed.  I feel the risks of no intervention will lead to serious problems that outweigh the operative risks; therefore, I recommended White to reduce and repair the hernia. I explained laparoscopic techniques with possible need for an open approach. I noted usual use of mesh to patch and/or buttress hernia repair  Risks such as bleeding, infection, abscess, need for further treatment, heart attack, stroke, death, and other risks were discussed. I noted a good likelihood this will help address the problem. Goals of post-operative recovery were discussed as well. Possibility that this will not correct all symptoms was explained. I stressed the importance of low-impact activity, aggressive pain control, avoiding constipation, & not pushing through pain to minimize risk of post-operative chronic pain or injury. Possibility of reherniation was discussed. We will work to minimize complications.  An educational handout further explaining the pathology & treatment options was given as well. Questions were answered. The patient expresses understanding & wishes to proceed with White. Pt Education - [facility] Hernia Post-Op HCI (Lilienne Weins): discussed with patient and provided information. Pt Education - [facility] Pain Control (Chad Donoghue) Pt Education - Groin (Inguinal) Hernia Repair: hernia

## 2014-01-07 ENCOUNTER — Telehealth: Payer: Self-pay | Admitting: *Deleted

## 2014-01-07 NOTE — Telephone Encounter (Signed)
Clearance for bil inguinal hernia wo obstruction or gangrene faxed. Dc pradaxa 2 days prior to the procedure and resume 2 days after.

## 2014-01-21 ENCOUNTER — Encounter: Payer: Self-pay | Admitting: Cardiology

## 2014-01-27 NOTE — Telephone Encounter (Signed)
[employer] would like a call back about sur clear.  ##  [phone] ALISHA.

## 2014-01-29 ENCOUNTER — Telehealth (INDEPENDENT_AMBULATORY_CARE_PROVIDER_SITE_OTHER): Payer: Self-pay

## 2014-01-29 NOTE — Telephone Encounter (Signed)
LM on 11/11 requesting for [name], RN to call me back to discuss our mutual pt but I have not received a return call. I want to talk to [name], RN about the clearance that is in epic b/c I need Dr Stanford Breed to sign off on it for [place] to be able to use it for the clearance. We never received a written cardiac clearance on the pt stating he was ok for surgery. I just need for Dr Stanford Breed to please make addendum on [name], RN's note in epic that he agree's with her note so his name will be on the clearance in epic. If any questions please call me at [place].

## 2014-01-29 NOTE — Telephone Encounter (Signed)
I have re faxed the clearance from to [phone].

## 2014-02-02 NOTE — Telephone Encounter (Signed)
Thank you it has been received and scanned into pt's chart. Pt can be scheduled for surgery now. Pt has been notified that we received clearance.

## 2014-02-05 ENCOUNTER — Other Ambulatory Visit: Payer: Self-pay

## 2014-02-05 MED ORDER — LISINOPRIL 20 MG PO TABS: 20.0000 mg | ORAL_TABLET | Freq: Every evening | ORAL | Status: DC

## 2014-02-09 ENCOUNTER — Other Ambulatory Visit: Payer: Self-pay | Admitting: Family Medicine

## 2014-03-13 ENCOUNTER — Other Ambulatory Visit: Payer: Self-pay | Admitting: Family Medicine

## 2014-03-22 ENCOUNTER — Other Ambulatory Visit: Payer: Self-pay | Admitting: Family Medicine

## 2014-04-14 ENCOUNTER — Other Ambulatory Visit: Payer: Self-pay | Admitting: Family Medicine

## 2014-04-19 ENCOUNTER — Ambulatory Visit (INDEPENDENT_AMBULATORY_CARE_PROVIDER_SITE_OTHER): Payer: BLUE CROSS/BLUE SHIELD | Admitting: Pulmonary Disease

## 2014-04-19 ENCOUNTER — Encounter: Payer: Self-pay | Admitting: Pulmonary Disease

## 2014-04-19 VITALS — BP 138/76 | HR 80 | Temp 98.0°F | Ht 71.0 in | Wt 215.6 lb

## 2014-04-19 DIAGNOSIS — G4733 Obstructive sleep apnea (adult) (pediatric): Secondary | ICD-10-CM

## 2014-04-19 DIAGNOSIS — G4737 Central sleep apnea in conditions classified elsewhere: Secondary | ICD-10-CM

## 2014-04-19 DIAGNOSIS — G4731 Primary central sleep apnea: Secondary | ICD-10-CM

## 2014-04-19 NOTE — Progress Notes (Signed)
Subjective:    Patient ID: Scott White, male    DOB: 10-28-1960, 54 y.o.   MRN: [id number]  HPI The patient is a 54 year old male who comes in today to reestablish for management of complex sleep apnea. He was diagnosed with complex apnea in 2012, with an AHI of 24 events per hour. He initially tried to work on weight loss without success, and then ordered a snore appliance on line. This resulted in movement of his teeth and severe mouth dryness, and therefore he discontinued. He has continued to have frequent awakenings, as well as palpitations at night that scare him. He has known atrial arrhythmia for which he is on Tikosyn. He is not rested in the mornings upon arising, but feels that overall his alertness is adequate. His weight is down 12 pounds since the last visit, and his Epworth score today is 1. He is here today to discuss possible treatment with a positive pressure device.   Sleep Questionnaire What time do you typically go to bed?( Between what hours) 9-10:30p 9-10:30p at 1618 on 04/19/14 by Inge Rise, CMA How long does it take you to fall asleep? 30 min 30 min at 1618 on 04/19/14 by Inge Rise, CMA How many times during the night do you wake up? 4 4 at 1618 on 04/19/14 by Inge Rise, CMA What time do you get out of bed to start your day? 0530 0530 at 1618 on 04/19/14 by Inge Rise, CMA Do you drive or operate heavy machinery in your occupation? No No at 1618 on 04/19/14 by Inge Rise, CMA How much has your weight changed (up or down) over the past two years? (In pounds) 5 lb (2.268 kg) 5 lb (2.268 kg) at 1618 on 04/19/14 by Inge Rise, CMA Have you ever had a sleep study before? Yes Yes at 1618 on 04/19/14 by Inge Rise, CMA If yes, [facility] at 1618 on 04/19/14 by Inge Rise, CMA If yes, date of study? 2012 2012 at 1618 on 04/19/14 by Inge Rise, CMA Do you currently use CPAP? No No at 1618 on 04/19/14 by Inge Rise, CMA Do you wear oxygen at any time? No No at 1618 on 04/19/14 by Inge Rise, CMA   Review of Systems  Constitutional: Negative for fever and unexpected weight change.  HENT: Negative for congestion, dental problem, ear pain, nosebleeds, postnasal drip, rhinorrhea, sinus pressure, sneezing, sore throat and trouble swallowing.   Eyes: Negative for redness and itching.  Respiratory: Negative for cough, chest tightness, shortness of breath and wheezing.   Cardiovascular: Positive for palpitations. Negative for leg swelling.  Gastrointestinal: Negative for nausea and vomiting.  Genitourinary: Negative for dysuria.  Musculoskeletal: Positive for arthralgias. Negative for joint swelling.  Skin: Negative for rash.  Neurological: Negative for headaches.  Hematological: Does not bruise/bleed easily.  Psychiatric/Behavioral: Negative for dysphoric mood. The patient is not nervous/anxious.        Objective:   Physical Exam Constitutional:  Overweight male, no acute distress  HENT:  Nares patent without discharge  Oropharynx without exudate, palate and uvula are elongated  Eyes:  Perrla, eomi, no scleral icterus  Neck:  No JVD, no TMG  Cardiovascular:  Normal rate, regular rhythm, no rubs or gallops.  No murmurs        Intact distal pulses  Pulmonary :  Normal breath sounds, no stridor or respiratory distress   No rales,  rhonchi, or wheezing  Abdominal:  Soft, nondistended, bowel sounds present.  No tenderness noted.   Musculoskeletal:  No lower extremity edema noted.  Lymph Nodes:  No cervical lymphadenopathy noted  Skin:  No cyanosis noted  Neurologic:  Alert, appropriate, moves all 4 extremities without obvious deficit.         Assessment & Plan:

## 2014-04-19 NOTE — Assessment & Plan Note (Signed)
The patient has a history of complex sleep apnea dating back to 2012, and was unsuccessful with treatment with weight loss or a snore appliance. He is now having frequent awakenings at night with palpitations, and nonrestorative sleep. He would like to treat this more aggressively with a trial of C Pap. He understands that with complex apnea a C Pap device can sometimes worsen his central events. Will start with this on a limited pressure, and if he has poor tolerance, he will need an ASV device. The patient is agreeable to this approach.

## 2014-04-19 NOTE — Patient Instructions (Signed)
Will start on cpap at a limited pressure.  If you are having tolerance issues, let us know. Work on weight loss followup with me again in 6weeks.

## 2014-05-13 ENCOUNTER — Other Ambulatory Visit: Payer: Self-pay | Admitting: Physician Assistant

## 2014-05-15 ENCOUNTER — Other Ambulatory Visit: Payer: Self-pay | Admitting: Physician Assistant

## 2014-06-04 ENCOUNTER — Ambulatory Visit (INDEPENDENT_AMBULATORY_CARE_PROVIDER_SITE_OTHER): Payer: BLUE CROSS/BLUE SHIELD | Admitting: Pulmonary Disease

## 2014-06-04 ENCOUNTER — Encounter: Payer: Self-pay | Admitting: Pulmonary Disease

## 2014-06-04 VITALS — BP 144/78 | HR 77 | Temp 97.6°F | Ht 71.0 in | Wt 213.4 lb

## 2014-06-04 DIAGNOSIS — G4731 Primary central sleep apnea: Secondary | ICD-10-CM

## 2014-06-04 DIAGNOSIS — G4737 Central sleep apnea in conditions classified elsewhere: Secondary | ICD-10-CM

## 2014-06-04 DIAGNOSIS — G4733 Obstructive sleep apnea (adult) (pediatric): Secondary | ICD-10-CM

## 2014-06-04 NOTE — Assessment & Plan Note (Signed)
The patient feels that he has done very well with his C Pap device since the last visit, and has seen improved sleep and better daytime alertness. His download does show some breakthrough events that are primarily central, and we will need to watch this very closely. I am willing to increase his pressure a small amount to see if he will do better, and he is to let us know if he is having increased symptoms. If he has worsening issues, will need to consider a change to an ASV device.

## 2014-06-04 NOTE — Patient Instructions (Signed)
Will increase the upper range of your auto cpap pressure to see if your apnea is better controlled.  Will need to monitor to make sure your centrals are not getting worse. followup with me again in 69mos, but call if you feel you are not doing as well.

## 2014-06-04 NOTE — Progress Notes (Signed)
   Subjective:    Patient ID: Scott White, male    DOB: 1961-02-22, 54 y.o.   MRN: [id number]  HPI The patient comes in today for follow-up of his complex sleep apnea. Started on CPAP at the last visit, understanding this may worsen his central component. He feels that he is sleeping much better on his device, and has improved daytime alertness. He is having no significant mask leak issues, and no pressure tolerance issues. His download shows excellent compliance, no significant mask leak, but he is having some breakthrough apnea with an AHI of 5.6 events per hour. The majority of these are central in nature.   Review of Systems  Constitutional: Negative for fever and unexpected weight change.  HENT: Negative for congestion, dental problem, ear pain, nosebleeds, postnasal drip, rhinorrhea, sinus pressure, sneezing, sore throat and trouble swallowing.   Eyes: Negative for redness and itching.  Respiratory: Negative for cough, chest tightness, shortness of breath and wheezing.   Cardiovascular: Negative for palpitations and leg swelling.  Gastrointestinal: Negative for nausea and vomiting.  Genitourinary: Negative for dysuria.  Musculoskeletal: Negative for joint swelling.  Skin: Negative for rash.  Neurological: Negative for headaches.  Hematological: Does not bruise/bleed easily.  Psychiatric/Behavioral: Negative for dysphoric mood. The patient is not nervous/anxious.        Objective:   Physical Exam Well-developed male in no acute distress Nose without purulence or discharge noted Neck without lymphadenopathy or thyromegaly No skin breakdown or pressure necrosis from the C Pap mask Lower extremities without edema, no cyanosis Alert and oriented, moves all 4 extremities.       Assessment & Plan:

## 2014-06-14 ENCOUNTER — Other Ambulatory Visit: Payer: Self-pay | Admitting: Family Medicine

## 2014-06-15 NOTE — Telephone Encounter (Signed)
Pt has appt sch w/you for 07/19/14. OK to give him 2 mos of RFs to cover him until then?

## 2014-06-16 NOTE — Telephone Encounter (Signed)
Yes.  Ok to rf until next appt. Signed Rx.

## 2014-07-12 ENCOUNTER — Other Ambulatory Visit: Payer: Self-pay

## 2014-07-12 ENCOUNTER — Other Ambulatory Visit: Payer: Self-pay | Admitting: *Deleted

## 2014-07-12 MED ORDER — ROSUVASTATIN CALCIUM 40 MG PO TABS: 40.0000 mg | ORAL_TABLET | Freq: Every day | ORAL | Status: DC

## 2014-07-12 NOTE — Telephone Encounter (Signed)
Pt already should have RF of the XR version of Metformin from 06/16/14. Denied req for RF of immed rel version.

## 2014-07-16 ENCOUNTER — Telehealth: Payer: Self-pay

## 2014-07-16 NOTE — Telephone Encounter (Signed)
Pt has an upcoming appt on Monday, with Dr. Carlota Raspberry to have his medication for crestor refilled, he would like to know if he needs to come in for fasting labs on Monday or not. Please advise.  Best# [phone]

## 2014-07-16 NOTE — Telephone Encounter (Signed)
lmom to Advise pt he can come in for fasting labs on Monday.

## 2014-07-19 ENCOUNTER — Telehealth: Payer: Self-pay | Admitting: Cardiology

## 2014-07-19 ENCOUNTER — Ambulatory Visit (INDEPENDENT_AMBULATORY_CARE_PROVIDER_SITE_OTHER): Payer: BLUE CROSS/BLUE SHIELD | Admitting: Family Medicine

## 2014-07-19 ENCOUNTER — Encounter: Payer: Self-pay | Admitting: Family Medicine

## 2014-07-19 VITALS — BP 118/80 | HR 85 | Temp 98.0°F | Resp 16 | Ht 72.0 in | Wt 210.8 lb

## 2014-07-19 DIAGNOSIS — G4733 Obstructive sleep apnea (adult) (pediatric): Secondary | ICD-10-CM

## 2014-07-19 DIAGNOSIS — I482 Chronic atrial fibrillation, unspecified: Secondary | ICD-10-CM

## 2014-07-19 DIAGNOSIS — E119 Type 2 diabetes mellitus without complications: Secondary | ICD-10-CM | POA: Diagnosis not present

## 2014-07-19 DIAGNOSIS — I10 Essential (primary) hypertension: Secondary | ICD-10-CM | POA: Diagnosis not present

## 2014-07-19 DIAGNOSIS — D229 Melanocytic nevi, unspecified: Secondary | ICD-10-CM

## 2014-07-19 DIAGNOSIS — E785 Hyperlipidemia, unspecified: Secondary | ICD-10-CM

## 2014-07-19 DIAGNOSIS — Z9989 Dependence on other enabling machines and devices: Secondary | ICD-10-CM

## 2014-07-19 LAB — LIPID PANEL
CHOL/HDL RATIO: 5.4 ratio
Cholesterol: 172 mg/dL (ref 0–200)
HDL: 32 mg/dL — ABNORMAL LOW (ref >=40)
LDL CALC: 100 mg/dL — AB (ref 0–99)
Triglycerides: 198 mg/dL — ABNORMAL HIGH (ref <150)
VLDL: 40 mg/dL (ref 0–40)

## 2014-07-19 LAB — COMPREHENSIVE METABOLIC PANEL
ALT: 20 U/L (ref 0–53)
AST: 18 U/L (ref 0–37)
Albumin: 4.4 g/dL (ref 3.5–5.2)
Alkaline Phosphatase: 54 U/L (ref 39–117)
BUN: 18 mg/dL (ref 6–23)
CALCIUM: 9.6 mg/dL (ref 8.4–10.5)
CHLORIDE: 103 meq/L (ref 96–112)
CO2: 25 mEq/L (ref 19–32)
Creat: 0.91 mg/dL (ref 0.50–1.35)
Glucose, Bld: 165 mg/dL — ABNORMAL HIGH (ref 70–99)
Potassium: 4.5 mEq/L (ref 3.5–5.3)
Sodium: 140 mEq/L (ref 135–145)
TOTAL PROTEIN: 7.4 g/dL (ref 6.0–8.3)
Total Bilirubin: 0.5 mg/dL (ref 0.2–1.2)

## 2014-07-19 LAB — POCT GLYCOSYLATED HEMOGLOBIN (HGB A1C): HEMOGLOBIN A1C: 7.7

## 2014-07-19 LAB — GLUCOSE, POCT (MANUAL RESULT ENTRY): POC GLUCOSE: 151 mg/dL — AB (ref 70–99)

## 2014-07-19 MED ORDER — METFORMIN HCL 500 MG PO TABS: 500.0000 mg | ORAL_TABLET | Freq: Two times a day (BID) | ORAL | Status: DC

## 2014-07-19 NOTE — Progress Notes (Addendum)
Subjective:    Patient ID: Scott White, male    DOB: December 24, 1960, 54 y.o.   MRN: [id number] This chart was scribed for Merri Ray, MD by Zola Button, Medical Scribe. This patient was seen in Room 28 and the patient's care was started at 1:38 PM.    HPI HPI Comments: Scott White is a 54 y.o. male with a history of diabetes, hyperlipidemia, hypertension, obstructive sleep apnea, and atrial fibrillation who presents to the [facility] for multitiple concerns.  Diabetes: Last A1C in August 2015 was 6.3. Urina micro albumin was negative. CMP and lipid panel drawn at that time. He has not been checking his blood sugar at home. He has his eyes checked a few months ago and was found to have no problems.  Hyperlipidemia:  Lab Results  Component Value Date   CHOL 201* 11/16/2013   HDL 38* 11/16/2013   LDLCALC 108* 11/16/2013   LDLDIRECT 92.8 11/04/2012   TRIG 274* 11/16/2013   CHOLHDL 5.3 11/16/2013  Continued Crestor at the same dose, but advised to watch diet and planned for re-check in 6 months.  Hypertension: Creatinine was normal in August. He was continued on ACE inhibitor and metoprolol. He is followed by Dr. Stanford Breed. CPAP has helped with the palpitations, now having them less frequently.  Obstructive Sleep Apnea: Followed by Dr. Gwenette Greet with CPAP. Last visit March 18th, 2016. He will see Dr. Gwenette Greet in about 4 months.  Atrial Fibrillation: He is on Tikosyn and Pradaxa. Last cardiology visit August 2015. Patient denies chest pain, lightheadedness, dizziness, and SOB.  Groin Pain: He had a hernia repair late last year for a right inguinal hernia on December 14th. He followed up with the surgeon about 2 weeks after the surgery. Patient has been having occasional groin pain for the past 3 months.  Concern for Nevus on Top of Head: There area a few moles on top of his scalp that he has noticed in the past 6 months that have been getting bigger. This was first  noticed by a co-worker. He does not have a dermatologist. On his mother's side of his family, he notes that some family members have had moles removed. No FMHx of skin cancer per patient.  Patient Active Problem List   Diagnosis Date Noted  . Complex sleep apnea syndrome 09/22/2010  . Long term (current) use of anticoagulants 07/03/2010  . Long term current use of anticoagulant 05/31/2010  . DYSLIPIDEMIA 09/27/2008  . HYPERTRIGLYCERIDEMIA 02/11/2008  . ESSENTIAL HYPERTENSION, BENIGN 02/11/2008  . CORONARY ATHEROSCLEROSIS NATIVE CORONARY ARTERY 02/11/2008  . ATRIAL FIBRILLATION 02/11/2008   Past Medical History  Diagnosis Date  . CAD (coronary artery disease)     a. Status post acute inferior wall myocardial infarction emergent bare metal stenting of the mid RCA, May 2004,  residual nonobstructive coronary artery disease Ejection fraction 60% by echocardiography 04/2007  . HTN (hypertension)   . Hyperlipidemia   . Sleep apnea     followed by Dr Gwenette Greet  . Myocardial infarction   . Atrial fibrillation     a. Persistent, s/p afib ablation by [facility] 08/16/10;  b. s/p failed DCCV 05/2011  . CHF (congestive heart failure)   . Diabetes    Past Surgical History  Procedure Laterality Date  . Cardiac catheterization  2004     Successful PTCA and stent placement in the mid right   coronary artery with an extra 99% narrowing to 0% with placement of a 3.5  x  20 mm Express 2 stent with improvement of TIMI grade 1 flow to TIMI grade 3.  . Atrial ablatio  08/16/10    afib ablation by [facility]  . Cardioversion  06/15/2011    Procedure: CARDIOVERSION;  Surgeon: Fay Records, MD;  Location: [facility];  Service: Cardiovascular;  Laterality: N/A;  . Tonsilectomy     No Known Allergies Prior to Admission medications   Medication Sig Start Date End Date Taking? Authorizing Provider  dabigatran (PRADAXA) 150 MG CAPS capsule Take 1 capsule (150 mg total) by mouth 2 (two) times daily. 12/01/13  Yes Lelon Perla, MD    dofetilide (TIKOSYN) 500 MCG capsule TAKE ONE CAPSULE TWICE A DAY 12/14/13  Yes Lelon Perla, MD  lisinopril (PRINIVIL,ZESTRIL) 20 MG tablet Take 1 tablet (20 mg total) by mouth every evening. 02/05/14 02/05/15 Yes Lelon Perla, MD  metFORMIN (GLUCOPHAGE-XR) 500 MG 24 hr tablet TAKE 1 TABLET (500 MG TOTAL) BY MOUTH DAILY WITH BREAKFAST. 06/16/14  Yes [name], MD  metoprolol succinate (TOPROL-XL) 25 MG 24 hr tablet TAKE 1 TABLET BY MOUTH EVERY DAY 12/03/13  Yes Lelon Perla, MD  Multiple Vitamins-Minerals (CENTRUM PO) Take 1 tablet by mouth daily.    Yes Historical Provider, MD  nitroGLYCERIN (NITROSTAT) 0.4 MG SL tablet Place 0.4 mg under the tongue every 5 (five) minutes as needed. For chest pain.   Yes Historical Provider, MD  Omega-3 Fatty Acids (FISH OIL PO) Take by mouth daily.   Yes Historical Provider, MD  rosuvastatin (CRESTOR) 40 MG tablet Take 1 tablet (40 mg total) by mouth daily. 07/12/14  Yes Lelon Perla, MD   History   Social History  . Marital Status: Single    Spouse Name: N/A  . Number of Children: 0  . Years of Education: N/A   Occupational History  . Teacher, English as a foreign language    Social History Main Topics  . Smoking status: Never Smoker   . Smokeless tobacco: Never Used  . Alcohol Use: 0.6 oz/week    1 Cans of beer per week     Comment:  three times a week  . Drug Use: No  . Sexual Activity: Yes   Other Topics Concern  . Not on file   Social History Narrative   Pt lives in [place] alone.  Single.   He is a Teacher, English as a foreign language for [employer]     Review of Systems  Constitutional: Negative for fatigue and unexpected weight change.  Eyes: Negative for visual disturbance.  Respiratory: Positive for cough. Negative for chest tightness and shortness of breath.        Slight cough with allergies.  Cardiovascular: Positive for palpitations. Negative for chest pain and leg swelling.       Palpitations have decreased.  Gastrointestinal:  Negative for abdominal pain and blood in stool.  Neurological: Negative for dizziness, light-headedness and headaches.       Objective:   Physical Exam  Constitutional: He is oriented to person, place, and time. He appears well-developed and well-nourished.  HENT:  Head: Normocephalic and atraumatic.  Eyes: EOM are normal. Pupils are equal, round, and reactive to light.  Neck: No JVD present. Carotid bruit is not present.  Cardiovascular: Normal rate and normal heart sounds.  An irregularly irregular rhythm present.  No murmur heard. Pulmonary/Chest: Effort normal and breath sounds normal. He has no rales.  Genitourinary:  No palpable hernia. Tender over right groin where patient had prior  hernia repair per patient. Skin is intact. No apparent swelling.  Musculoskeletal: He exhibits no edema.  Neurological: He is alert and oriented to person, place, and time.  Skin: Skin is warm and dry. No erythema.  Top of scalp: 2 small light brown nevi, approximately 1 cm and 8 mm without surrounding erythema. They were flat.  Psychiatric: He has a normal mood and affect.  Vitals reviewed.   Filed Vitals:   07/19/14 1241  BP: 118/80  Pulse: 85  Temp: 98 F (36.7 C)  TempSrc: Oral  Resp: 16  Height: 6' (1.829 m)  Weight: 210 lb 12.8 oz (95.618 kg)  SpO2: 97%    Results for orders placed or performed in visit on 07/19/14  POCT glycosylated hemoglobin (Hb A1C)  Result Value Ref Range   Hemoglobin A1C 7.7   POCT glucose (manual entry)  Result Value Ref Range   POC Glucose 151 (A) 70 - 99 mg/dl    EKG: atrial fibrillation, rate 80. QTC 445     Assessment & Plan:   Scott White is a 54 y.o. male Type 2 diabetes mellitus without complication - Plan: HM Diabetes Foot Exam, POCT glycosylated hemoglobin (Hb A1C), POCT glucose (manual entry), metFORMIN (GLUCOPHAGE) 500 MG tablet  - decreased control. Will increase metformin to 500mg  BID.   Essential hypertension - Plan:  Comprehensive metabolic panel, Lipid panel  -stable. No med changes.   Hyperlipidemia - Plan: Comprehensive metabolic panel, Lipid panel  - repeat lipids and CMP pending. Continue Crestor 40mg  qd.   Nevus, scalp - Plan: Ambulatory referral to Dermatology  Chronic atrial fibrillation - Plan: EKG 12-Lead  -with recurrence of Afib.  Asymptomatic, with overall stable rate. Discussed with on call cardiologist. Will have followed up with his cardiologist in next few weeks. If palpitations, or elevated HR  - can increase metoprolol to 50mg  qd. No change in meds for now as BP 118/80 and orthostatic/hypotensive precautions given if additional metoprolol dose taken. ER/RTC precautions.   OSA on CPAP  -cont CPAP and routine follow up with sleep specialist.   Meds ordered this encounter  Medications  . metFORMIN (GLUCOPHAGE) 500 MG tablet    Sig: Take 1 tablet (500 mg total) by mouth 2 (two) times daily with a meal.    Dispense:  180 tablet    Refill:  1   Patient Instructions  Call your surgeon for follow up appointment to check sore area to make sure prior hernia repair ok.  Return to the clinic or go to the nearest emergency room if any of your symptoms worsen or new symptoms occur.  I will refer you to dermatology to take a look at the moles on your scalp.   You should receive a call, email, or letter about your lab results within the next week to 10 days.   Increase metformin to twice per day, check home blood sugars, watch diet and recheck in 3 months.   I spoke with a cardiologist on call for Dr. Stanford Breed - he recommended following up with him in next few weeks.  If you do feel your heart rate elevate/race or palpitations - you can take one additional dose of metoprolol (but watch your blood pressure if taking an extra dose to make sure it is not going too low - lightheadedness or dizziness could be symptoms of this).      I personally performed the services described in this  documentation, which was scribed in my presence. The recorded information  has been reviewed and considered, and addended by me as needed.

## 2014-07-19 NOTE — Telephone Encounter (Signed)
Baker Janus the RN called in stating that the pt needs to be seen by Dr. Stanford Breed as soon as possible because he has gone back in to Afib. Please f/u with pt  Thanks

## 2014-07-19 NOTE — Patient Instructions (Addendum)
Call your surgeon for follow up appointment to check sore area to make sure prior hernia repair ok.  Return to the clinic or go to the nearest emergency room if any of your symptoms worsen or new symptoms occur.  I will refer you to dermatology to take a look at the moles on your scalp.   You should receive a call, email, or letter about your lab results within the next week to 10 days.   Increase metformin to twice per day, check home blood sugars, watch diet and recheck in 3 months.   I spoke with a cardiologist on call for Dr. Stanford Breed - he recommended following up with him in next few weeks.  If you do feel your heart rate elevate/race or palpitations - you can take one additional dose of metoprolol (but watch your blood pressure if taking an extra dose to make sure it is not going too low - lightheadedness or dizziness could be symptoms of this).

## 2014-07-22 ENCOUNTER — Ambulatory Visit (INDEPENDENT_AMBULATORY_CARE_PROVIDER_SITE_OTHER): Payer: BLUE CROSS/BLUE SHIELD | Admitting: Cardiology

## 2014-07-22 VITALS — BP 128/90 | HR 75 | Ht 71.0 in | Wt 211.6 lb

## 2014-07-22 DIAGNOSIS — I480 Paroxysmal atrial fibrillation: Secondary | ICD-10-CM | POA: Diagnosis not present

## 2014-07-22 NOTE — Patient Instructions (Signed)
Your physician recommends that you schedule a follow-up appointment You are schedule to see Dr Stanford Breed in August a letter will be mailed out to you  If you continue to feel your heart out of rhythm call the [facility] at [address] to make an appointment # [phone]

## 2014-07-22 NOTE — Telephone Encounter (Signed)
This should have come to me.

## 2014-07-22 NOTE — Progress Notes (Signed)
07/22/2014 Scott White   10-13-1960  [id number]  Primary Physician: [name], MD Primary Cardiologist: Dr. Stanford Breed  Reason for Visit/CC: F/U for Atrial Fibrillation   HPI:  The patient is a 54 y/o male, followed by Dr. Stanford Breed, with a history of atrial fibrillation, CAD, HTN and HLD. Patient has had previous PCI of his right coronary artery in the setting of an acute infarct in 2004. No obstructive disease in the left system. He has had atrial fibrillation and has undergone previous ablation. Last echocardiogram in April of 2013 showed normal LV function. Nuclear study in October of 2013 showed an ejection fraction of 50%. There was no ischemia. He is on Tikosyn and Pradaxa for his Afib.   He presents to clinic today after being referred by his PCP due to concerns for recurrent atrial fibrillation. The patient reports recent intermittent spells of palpitations over the last several weeks. No chest pain, dyspnea, syncope or near syncope. He reports full medication compliance and avoidance of excessive alcohol and caffeine. No recent viral illnesses, fever or chills. He was seen by his PCP the other day and EKG showed atrial fibrillation with a CVR. However, patient noted that he was surprised at that time because he was asymptomatic at time EKG was performed.  Today in clinic, EKG shows NSR. He denies any symptoms.    Current Outpatient Prescriptions  Medication Sig Dispense Refill  . dabigatran (PRADAXA) 150 MG CAPS capsule Take 1 capsule (150 mg total) by mouth 2 (two) times daily. 180 capsule 3  . dofetilide (TIKOSYN) 500 MCG capsule TAKE ONE CAPSULE TWICE A DAY 180 capsule 2  . lisinopril (PRINIVIL,ZESTRIL) 20 MG tablet Take 1 tablet (20 mg total) by mouth every evening. 30 tablet 5  . metFORMIN (GLUCOPHAGE) 500 MG tablet Take 1 tablet (500 mg total) by mouth 2 (two) times daily with a meal. 180 tablet 1  . metoprolol succinate (TOPROL-XL) 25 MG 24 hr tablet TAKE 1 TABLET  BY MOUTH EVERY DAY 30 tablet 11  . Multiple Vitamins-Minerals (CENTRUM PO) Take 1 tablet by mouth daily.     . nitroGLYCERIN (NITROSTAT) 0.4 MG SL tablet Place 0.4 mg under the tongue every 5 (five) minutes as needed. For chest pain.    . Omega-3 Fatty Acids (FISH OIL PO) Take by mouth daily.    . rosuvastatin (CRESTOR) 40 MG tablet Take 1 tablet (40 mg total) by mouth daily. 30 tablet 0   No current facility-administered medications for this visit.    No Known Allergies  History   Social History  . Marital Status: Single    Spouse Name: N/A  . Number of Children: 0  . Years of Education: N/A   Occupational History  . Teacher, English as a foreign language    Social History Main Topics  . Smoking status: Never Smoker   . Smokeless tobacco: Never Used  . Alcohol Use: 0.6 oz/week    1 Cans of beer per week     Comment:  three times a week  . Drug Use: No  . Sexual Activity: Yes   Other Topics Concern  . Not on file   Social History Narrative   Pt lives in [place] alone.  Single.   He is a Teacher, English as a foreign language for [employer]     Review of Systems: General: negative for chills, fever, night sweats or weight changes.  Cardiovascular: negative for chest pain, dyspnea on exertion, edema, orthopnea, palpitations, paroxysmal nocturnal dyspnea or shortness of breath  Dermatological: negative for rash Respiratory: negative for cough or wheezing Urologic: negative for hematuria Abdominal: negative for nausea, vomiting, diarrhea, bright red blood per rectum, melena, or hematemesis Neurologic: negative for visual changes, syncope, or dizziness All other systems reviewed and are otherwise negative except as noted above.    Blood pressure 128/90, pulse 75, height 5\' 11"  (1.803 m), weight 211 lb 9.6 oz (95.981 kg).  General appearance: alert, cooperative and no distress Neck: no carotid bruit and no JVD Lungs: clear to auscultation bilaterally Heart: regular rate and rhythm, S1, S2 normal,  no murmur, click, rub or gallop Extremities: no LEE Pulses: 2+ and symmetric Skin: warm and dry Neurologic: Grossly normal  EKG NSR 75 bpm  ASSESSMENT AND PLAN:   1. PAF: h/o ablation by Dr. Rayann Heman in the past. Now on Tikosyn + Pradaxa and reports full compliance. Has recently noticed recurrent palpitations over the last several weeks but no other symptoms. EKG from PCP office demonstrated atrial fibrillation w/ a CVR. This has been reviewed and dose appear to be atrial fibrillation. EKG today demonstrates NSR. I recommended evaluation with a cardiac monitor to assess for recurrent PAF to gage burden. If high recurrence rate, then he would be considered to have failed Tikosyn therapy and would need to be considered for alternative therapy. However, patient declined heart monitor. He has been instructed to continue current regimen. Importance of daily compliance with Pradaxa was stressed to reduce risk of stroke/TIA given high CHA2DS2 VASc score and questionalble recurrent PAF. He was advised to notify our office if recurrent symptoms. May be better followed in Afib clinic given he is on AADT.    PLAN  Continue current regimen. F/u with Dr. Stanford Breed in 6 months. F/u with Dr. Christiana Pellant clinic if recurrent afib.   SIMMONS, BRITTAINYPA-C 07/22/2014 10:06 AM

## 2014-07-23 ENCOUNTER — Encounter: Payer: Self-pay | Admitting: Cardiology

## 2014-07-30 ENCOUNTER — Other Ambulatory Visit: Payer: Self-pay

## 2014-07-30 MED ORDER — LISINOPRIL 20 MG PO TABS: 20.0000 mg | ORAL_TABLET | Freq: Every evening | ORAL | Status: DC

## 2014-08-01 NOTE — Telephone Encounter (Signed)
Was he seen?

## 2014-08-07 ENCOUNTER — Other Ambulatory Visit: Payer: Self-pay | Admitting: Family Medicine

## 2014-08-23 ENCOUNTER — Other Ambulatory Visit: Payer: Self-pay | Admitting: *Deleted

## 2014-08-23 MED ORDER — ROSUVASTATIN CALCIUM 40 MG PO TABS: 40.0000 mg | ORAL_TABLET | Freq: Every day | ORAL | Status: DC

## 2014-08-27 ENCOUNTER — Other Ambulatory Visit: Payer: Self-pay | Admitting: *Deleted

## 2014-08-27 MED ORDER — DOFETILIDE 500 MCG PO CAPS: ORAL_CAPSULE | ORAL | Status: DC

## 2014-10-07 ENCOUNTER — Telehealth: Payer: Self-pay

## 2014-10-07 ENCOUNTER — Other Ambulatory Visit: Payer: Self-pay

## 2014-10-07 MED ORDER — DOFETILIDE 500 MCG PO CAPS: ORAL_CAPSULE | ORAL | Status: DC

## 2014-10-07 NOTE — Telephone Encounter (Signed)
Med refill

## 2014-10-09 ENCOUNTER — Other Ambulatory Visit: Payer: Self-pay | Admitting: Family Medicine

## 2014-10-18 ENCOUNTER — Other Ambulatory Visit: Payer: Self-pay | Admitting: *Deleted

## 2014-10-18 MED ORDER — ROSUVASTATIN CALCIUM 40 MG PO TABS: 40.0000 mg | ORAL_TABLET | Freq: Every day | ORAL | Status: DC

## 2014-10-25 ENCOUNTER — Ambulatory Visit (INDEPENDENT_AMBULATORY_CARE_PROVIDER_SITE_OTHER): Payer: BLUE CROSS/BLUE SHIELD | Admitting: Family Medicine

## 2014-10-25 ENCOUNTER — Encounter: Payer: Self-pay | Admitting: Family Medicine

## 2014-10-25 VITALS — BP 130/94 | HR 70 | Temp 97.8°F | Resp 16 | Wt 204.8 lb

## 2014-10-25 DIAGNOSIS — I10 Essential (primary) hypertension: Secondary | ICD-10-CM

## 2014-10-25 DIAGNOSIS — E785 Hyperlipidemia, unspecified: Secondary | ICD-10-CM

## 2014-10-25 DIAGNOSIS — E119 Type 2 diabetes mellitus without complications: Secondary | ICD-10-CM | POA: Diagnosis not present

## 2014-10-25 DIAGNOSIS — I480 Paroxysmal atrial fibrillation: Secondary | ICD-10-CM

## 2014-10-25 LAB — POCT GLYCOSYLATED HEMOGLOBIN (HGB A1C): HEMOGLOBIN A1C: 7.2

## 2014-10-25 LAB — GLUCOSE, POCT (MANUAL RESULT ENTRY): POC GLUCOSE: 104 mg/dL — AB (ref 70–99)

## 2014-10-25 NOTE — Patient Instructions (Signed)
Keep a record of your blood pressures outside of the office and if running over 140/90.  We can check the urine test for protein at next visit. If you are having more frequent episosed of Afib, or new symptoms with these episodes - discuss with your cardiologist.  Return to the clinic or go to the nearest emergency room if any of your symptoms worsen or new symptoms occur. Your diabetes is closer to goal.  Continue same dose of metformin and improvements in diet should get [place] where we need to be in next few months.

## 2014-10-25 NOTE — Progress Notes (Signed)
Subjective:  This chart was scribed for Merri Ray, MD by Thea Alken, ED Scribe. This patient was seen in room 24 and the patient's care was started at 4:45 PM.  Patient ID: Scott White, male    DOB: 1960-10-14, 54 y.o.   MRN: [id number]  HPI   Chief Complaint  Patient presents with  . Diabetes    follow up increase metformin   HPI Comments: Scott White is a 54 y.o. male who presents to the [facility] diabetes follow up.   Diabetes We increased to metformin 500 mg bid at last visit which he takes twice a day. He does not check sugars at home. He has been watching his diet. He is complaint with medication but did miss a dose last night. Weight last visit was 210, today weight is 204. He is asymptomatic with medication change.  Afib Followed by Dr. Stanford Breed. Hx of CAD with acute infarct in 2004. Had previous ablation for afib and PCI for the RCA in 2004. Most recently seen May 5th in cardiology for afib. He takes Tikosyn and pradaxa. Recommended heart monitor at cardiology visit but he declined. Planned to f/u in 6 months with Dr. Stanford Breed. His next appointment with Dr. Stanford Breed will be September or October. He has palpitation about 1-2 times a week. He denies light headedness and dizziness.   hyperlipdemia He takes Crestor 40 mg qd.  Pt was seen by optho 05/2013, no retinopathy   Lab Results  Component Value Date   CHOL 172 07/19/2014   HDL 32* 07/19/2014   LDLCALC 100* 07/19/2014   LDLDIRECT 92.8 11/04/2012   TRIG 198* 07/19/2014   CHOLHDL 5.4 07/19/2014   Hypertension Stable at last visit on lisinopril 20 mg qd and toprol 20 mg qd. He tries to check BP at home, which usually reads around 125. Cardiology may 5 120/90.  Lab Results  Component Value Date   HGBA1C 7.7 07/19/2014    Patient Active Problem List   Diagnosis Date Noted  . Complex sleep apnea syndrome 09/22/2010  . Long term (current) use of anticoagulants 07/03/2010  .  Long term current use of anticoagulant 05/31/2010  . DYSLIPIDEMIA 09/27/2008  . HYPERTRIGLYCERIDEMIA 02/11/2008  . ESSENTIAL HYPERTENSION, BENIGN 02/11/2008  . CORONARY ATHEROSCLEROSIS NATIVE CORONARY ARTERY 02/11/2008  . ATRIAL FIBRILLATION 02/11/2008   Past Medical History  Diagnosis Date  . CAD (coronary artery disease)     a. Status post acute inferior wall myocardial infarction emergent bare metal stenting of the mid RCA, May 2004,  residual nonobstructive coronary artery disease Ejection fraction 60% by echocardiography 04/2007  . HTN (hypertension)   . Hyperlipidemia   . Sleep apnea     followed by Dr Gwenette Greet  . Myocardial infarction   . Atrial fibrillation     a. Persistent, s/p afib ablation by JA 08/16/10;  b. s/p failed DCCV 05/2011  . CHF (congestive heart failure)   . Diabetes    Past Surgical History  Procedure Laterality Date  . Cardiac catheterization  2004     Successful PTCA and stent placement in the mid right   coronary artery with an extra 99% narrowing to 0% with placement of a 3.5 x  20 mm Express 2 stent with improvement of TIMI grade 1 flow to TIMI grade 3.  . Atrial ablatio  08/16/10    afib ablation by JA  . Cardioversion  06/15/2011    Procedure: CARDIOVERSION;  Surgeon: Fay Records, MD;  Location: [facility] OR;  Service: Cardiovascular;  Laterality: N/A;  . Tonsilectomy     Not on File Prior to Admission medications   Medication Sig Start Date End Date Taking? Authorizing Provider  dabigatran (PRADAXA) 150 MG CAPS capsule Take 1 capsule (150 mg total) by mouth 2 (two) times daily. 12/01/13  Yes Scott Perla, MD  dofetilide (TIKOSYN) 500 MCG capsule TAKE ONE CAPSULE TWICE A DAY 10/07/14  Yes Scott Perla, MD  lisinopril (PRINIVIL,ZESTRIL) 20 MG tablet Take 1 tablet (20 mg total) by mouth every evening. 07/30/14 07/30/15 Yes Scott Perla, MD  metFORMIN (GLUCOPHAGE) 500 MG tablet Take 1 tablet (500 mg total) by mouth 2 (two) times daily with a meal. 07/19/14   Yes [name], MD  metoprolol succinate (TOPROL-XL) 25 MG 24 hr tablet TAKE 1 TABLET BY MOUTH EVERY DAY 12/03/13  Yes Scott Perla, MD  Multiple Vitamins-Minerals (CENTRUM PO) Take 1 tablet by mouth daily.    Yes Historical Provider, MD  nitroGLYCERIN (NITROSTAT) 0.4 MG SL tablet Place 0.4 mg under the tongue every 5 (five) minutes as needed. For chest pain.   Yes Historical Provider, MD  Omega-3 Fatty Acids (FISH OIL PO) Take by mouth daily.   Yes Historical Provider, MD  rosuvastatin (CRESTOR) 40 MG tablet Take 1 tablet (40 mg total) by mouth daily. 10/18/14  Yes Scott Perla, MD   History   Social History  . Marital Status: Single    Spouse Name: N/A  . Number of Children: 0  . Years of Education: N/A   Occupational History  . Teacher, English as a foreign language    Social History Main Topics  . Smoking status: Never Smoker   . Smokeless tobacco: Never Used  . Alcohol Use: 0.6 oz/week    1 Cans of beer per week     Comment:  three times a week  . Drug Use: No  . Sexual Activity: Yes   Other Topics Concern  . Not on file   Social History Narrative   Pt lives in [place] alone.  Single.   He is a Teacher, English as a foreign language for [employer]   Review of Systems  Skin: Negative for color change, rash and wound.  Neurological: Negative for dizziness and light-headedness.    Objective:  Physical Exam Filed Vitals:   10/25/14 1603  BP: 130/94  Pulse: 70  Temp: 97.8 F (36.6 C)  TempSrc: Oral  Resp: 16  Weight: 204 lb 12.8 oz (92.897 kg)   Results for orders placed or performed in visit on 10/25/14  POCT glucose (manual entry)  Result Value Ref Range   POC Glucose 104 (A) 70 - 99 mg/dl  POCT glycosylated hemoglobin (Hb A1C)  Result Value Ref Range   Hemoglobin A1C 7.2     Assessment & Plan:  Scott White is a 54 y.o. male Type 2 diabetes mellitus without complication - Plan: POCT glucose (manual entry), POCT glycosylated hemoglobin (Hb A1C), CANCELED:  Microalbumin, urine  - Slight improved control with diet changes and weight loss. He is near goal, so decided against change in medications at this time. Recheck A1c in 3 months.  Paroxysmal atrial fibrillation  - Intermittent symptoms, reiterated plan was discussed a cardiology visit, as if increased frequency, may need different medication than Tikosyn. Continue Pradaxa. RTC/ER precautions.  Hyperlipidemia  -Overall controlled with slight low HDL when last checked. He is on high-dose Crestor, no medication changes at this time.  Essential hypertension  -Borderline elevated  diastolic blood pressure in office. Advised to check home blood pressure readings and if remaining over 140/90, call to discuss possible medication changes.  Recheck in 3 months.  No orders of the defined types were placed in this encounter.   Patient Instructions  Keep a record of your blood pressures outside of the office and if running over 140/90.  We can check the urine test for protein at next visit. If you are having more frequent episosed of Afib, or new symptoms with these episodes - discuss with your cardiologist.  Return to the clinic or go to the nearest emergency room if any of your symptoms worsen or new symptoms occur. Your diabetes is closer to goal.  Continue same dose of metformin and improvements in diet should get [place] where we need to be in next few months.     I personally performed the services described in this documentation, which was scribed in my presence. The recorded information has been reviewed and considered, and addended by me as needed.

## 2014-11-26 ENCOUNTER — Other Ambulatory Visit: Payer: Self-pay

## 2014-11-26 MED ORDER — METOPROLOL SUCCINATE ER 25 MG PO TB24: ORAL_TABLET | ORAL | Status: DC

## 2014-11-26 NOTE — Telephone Encounter (Signed)
Consuelo Pandy, PA-C at 07/22/2014 10:06 AM  metoprolol succinate (TOPROL-XL) 25 MG 24 hr tabletTAKE 1 TABLET BY MOUTH EVERY DAY PLAN Continue current regimen. F/u with Dr. Stanford Breed in 6 months. F/u with Dr. [facility] if recurrent afib.   SIMMONS, BRITTAINYPA-C 07/22/2014 10:06 AM

## 2014-12-06 ENCOUNTER — Other Ambulatory Visit: Payer: Self-pay | Admitting: Family Medicine

## 2014-12-07 ENCOUNTER — Other Ambulatory Visit: Payer: Self-pay

## 2014-12-07 MED ORDER — DABIGATRAN ETEXILATE MESYLATE 150 MG PO CAPS: 150.0000 mg | ORAL_CAPSULE | Freq: Two times a day (BID) | ORAL | Status: DC

## 2014-12-07 NOTE — Telephone Encounter (Signed)
Consuelo Pandy, PA-C at 07/22/2014 10:06 AM  dabigatran (PRADAXA) 150 MG CAPS capsuleTake 1 capsule (150 mg total) by mouth 2 (two) times daily 1. PAF: h/o ablation by Dr. Rayann Heman in the past. Now on Tikosyn + Pradaxa and reports full compliance.

## 2015-01-03 ENCOUNTER — Other Ambulatory Visit: Payer: Self-pay | Admitting: Family Medicine

## 2015-01-17 ENCOUNTER — Other Ambulatory Visit: Payer: Self-pay | Admitting: *Deleted

## 2015-01-17 MED ORDER — LISINOPRIL 20 MG PO TABS: 20.0000 mg | ORAL_TABLET | Freq: Every evening | ORAL | Status: DC

## 2015-01-21 ENCOUNTER — Other Ambulatory Visit: Payer: Self-pay | Admitting: Cardiology

## 2015-03-01 ENCOUNTER — Encounter: Payer: Self-pay | Admitting: Family Medicine

## 2015-03-07 ENCOUNTER — Encounter: Payer: Self-pay | Admitting: Family Medicine

## 2015-03-07 ENCOUNTER — Ambulatory Visit (INDEPENDENT_AMBULATORY_CARE_PROVIDER_SITE_OTHER): Payer: BLUE CROSS/BLUE SHIELD | Admitting: Family Medicine

## 2015-03-07 VITALS — BP 125/82 | HR 61 | Temp 97.7°F | Resp 16 | Ht 71.0 in | Wt 210.6 lb

## 2015-03-07 DIAGNOSIS — I2510 Atherosclerotic heart disease of native coronary artery without angina pectoris: Secondary | ICD-10-CM

## 2015-03-07 DIAGNOSIS — M7581 Other shoulder lesions, right shoulder: Secondary | ICD-10-CM

## 2015-03-07 DIAGNOSIS — I480 Paroxysmal atrial fibrillation: Secondary | ICD-10-CM | POA: Diagnosis not present

## 2015-03-07 DIAGNOSIS — M25511 Pain in right shoulder: Secondary | ICD-10-CM | POA: Diagnosis not present

## 2015-03-07 DIAGNOSIS — M778 Other enthesopathies, not elsewhere classified: Secondary | ICD-10-CM

## 2015-03-07 DIAGNOSIS — E785 Hyperlipidemia, unspecified: Secondary | ICD-10-CM | POA: Diagnosis not present

## 2015-03-07 DIAGNOSIS — E119 Type 2 diabetes mellitus without complications: Secondary | ICD-10-CM

## 2015-03-07 LAB — POCT GLYCOSYLATED HEMOGLOBIN (HGB A1C): Hemoglobin A1C: 8

## 2015-03-07 LAB — GLUCOSE, POCT (MANUAL RESULT ENTRY): POC Glucose: 123 mg/dl — AB (ref 70–99)

## 2015-03-07 MED ORDER — NITROGLYCERIN 0.4 MG SL SUBL: 0.4000 mg | SUBLINGUAL_TABLET | SUBLINGUAL | Status: DC | PRN

## 2015-03-07 MED ORDER — METFORMIN HCL 850 MG PO TABS: ORAL_TABLET | ORAL | Status: DC

## 2015-03-07 NOTE — Progress Notes (Addendum)
Subjective:  This chart was scribed for Scott Ray, MD by Scott White, Medical Scribe. This patient was seen in room 25 and the patient's care was started 12:05 PM.   Patient ID: Scott White, male    DOB: 08-26-1960, 54 y.o.   MRN: [id number] Chief Complaint  Patient presents with  . Follow-up    Diabetes, Metformin  . Medication Refill    nitrostat    HPI [name] is a 54 y.o. male Here for follow up on DM.   DM Lab Results  Component Value Date   HGBA1C 7.2 10/25/2014   No change in medication, as close to goal. He's been doing well on his sugar. He's on metformin bid. He denies any complications with the metformin.   Lab Results  Component Value Date   MICROALBUR 0.50 11/16/2013   Vision He will see next year.   Dentist He saw last week.   Paroxysmal afib Seen in May by cardiologist for recurrence of a fib. He takes [place] and pradaxa. Plan to follow up in 6 months with cardiology .  Had previous ablation by Dr. Rayann Heman. Current cardiologist is Dr. Stanford Breed. He's on crestor.   He has occasional palpitations about once a month. He has a cpap machine for sleep apnea. If he doesn't sleep well at night, he notices palpitations and afib in the morning. He denies dizziness, chest pain, White in stool, hematuria.   CAD H/o PCI to right coronery artery after MI in 2004. He had nuclear study in oct 2013 with EF 50%, no ischemia. Has nitroglycerin if needed, request for refill. He hasn't had the need to take the nitroglycerin, which was prescribed 5 years ago. He wants it for emergencies.   Right shoulder pain This has been going on 3-4 weeks. He noticed it when he couldn't use certain machine at the gym. He can't lift his right arm. He denies similar problems in the past. He denies injuries to area.   Work He works at a [employer] as a Teacher, English as a foreign language at [employer].   Patient Active Problem List   Diagnosis Date Noted  . Complex sleep apnea  syndrome 09/22/2010  . Long term (current) use of anticoagulants 07/03/2010  . Long term current use of anticoagulant 05/31/2010  . DYSLIPIDEMIA 09/27/2008  . HYPERTRIGLYCERIDEMIA 02/11/2008  . ESSENTIAL HYPERTENSION, BENIGN 02/11/2008  . CORONARY ATHEROSCLEROSIS NATIVE CORONARY ARTERY 02/11/2008  . ATRIAL FIBRILLATION 02/11/2008   Past Medical History  Diagnosis Date  . CAD (coronary artery disease)     a. Status post acute inferior wall myocardial infarction emergent bare metal stenting of the mid RCA, May 2004,  residual nonobstructive coronary artery disease Ejection fraction 60% by echocardiography 04/2007  . HTN (hypertension)   . Hyperlipidemia   . Sleep apnea     followed by Dr Gwenette Greet  . Myocardial infarction ([place])   . Atrial fibrillation ([place])     a. Persistent, s/p afib ablation by JA 08/16/10;  b. s/p failed DCCV 05/2011  . CHF (congestive heart failure) ([place])   . Diabetes [facility])    Past Surgical History  Procedure Laterality Date  . Cardiac catheterization  2004     Successful PTCA and stent placement in the mid right   coronary artery with an extra 99% narrowing to 0% with placement of a 3.5 x  20 mm Express 2 stent with improvement of TIMI grade 1 flow to TIMI grade 3.  . Atrial ablatio  08/16/10  afib ablation by [facility]  . Cardioversion  06/15/2011    Procedure: CARDIOVERSION;  Surgeon: Fay Records, MD;  Location: [place];  Service: Cardiovascular;  Laterality: N/A;  . Tonsilectomy     No Known Allergies Prior to Admission medications   Medication Sig Start Date End Date Taking? Authorizing Provider  dabigatran (PRADAXA) 150 MG CAPS capsule Take 1 capsule (150 mg total) by mouth 2 (two) times daily. 12/07/14   Brittainy Erie Noe, PA-C  dofetilide (TIKOSYN) 500 MCG capsule TAKE ONE CAPSULE TWICE A DAY 10/07/14   Lelon Perla, MD  lisinopril (PRINIVIL,ZESTRIL) 20 MG tablet Take 1 tablet (20 mg total) by mouth every evening. 01/17/15 01/17/16  Lelon Perla, MD    metFORMIN (GLUCOPHAGE) 500 MG tablet TAKE 1 TABLET (500 MG TOTAL) BY MOUTH 2 (TWO) TIMES DAILY WITH A MEAL  "OFFICE VISIT NEEDED" 01/04/15   [name], MD  metoprolol succinate (TOPROL-XL) 25 MG 24 hr tablet TAKE 1 TABLET BY MOUTH EVERY DAY 01/21/15   Lelon Perla, MD  Multiple Vitamins-Minerals (CENTRUM PO) Take 1 tablet by mouth daily.     Historical Provider, MD  nitroGLYCERIN (NITROSTAT) 0.4 MG SL tablet Place 0.4 mg under the tongue every 5 (five) minutes as needed. For chest pain.    Historical Provider, MD  Omega-3 Fatty Acids (FISH OIL PO) Take by mouth daily.    Historical Provider, MD  rosuvastatin (CRESTOR) 40 MG tablet Take 1 tablet (40 mg total) by mouth daily. 10/18/14   Lelon Perla, MD   Social History   Social History  . Marital Status: Single    Spouse Name: N/A  . Number of Children: 0  . Years of Education: N/A   Occupational History  . Teacher, English as a foreign language    Social History Main Topics  . Smoking status: Never Smoker   . Smokeless tobacco: Never Used  . Alcohol Use: 0.6 oz/week    1 Cans of beer per week     Comment:  three times a week  . Drug Use: No  . Sexual Activity: Yes   Other Topics Concern  . Not on file   Social History Narrative   Pt lives in [place] alone.  Single.   He is a Teacher, English as a foreign language for [employer]   Review of Systems  Constitutional: Negative for fatigue and unexpected weight change.  Eyes: Negative for visual disturbance.  Respiratory: Negative for cough, chest tightness and shortness of breath.   Cardiovascular: Positive for palpitations. Negative for chest pain and leg swelling.  Gastrointestinal: Negative for abdominal pain and White in stool.  Genitourinary: Negative for hematuria.  Musculoskeletal: Positive for arthralgias (right shoulder).  Neurological: Negative for dizziness, light-headedness and headaches.       Objective:   Physical Exam  Constitutional: He is oriented to person, place, and  time. He appears well-developed and well-nourished.  HENT:  Head: Normocephalic and atraumatic.  Eyes: EOM are normal. Pupils are equal, round, and reactive to light.  Neck: No JVD present. Carotid bruit is not present.  Cardiovascular: Normal rate, regular rhythm and normal heart sounds.   No murmur heard. Pulmonary/Chest: Effort normal and breath sounds normal. He has no rales.  Musculoskeletal: He exhibits no edema.  c-spine: full rom without reproduction of right shoulder pain, CS AC non tender Slight tenderness in anterior shoulder just below deltoid and anterior deltoid R shoulder: Full ROM, full rotator cuff strength, negative empty can, slightly positive neer's at 120 degree  flexion  Neurological: He is alert and oriented to person, place, and time.  Skin: Skin is warm and dry.  Psychiatric: He has a normal mood and affect.  Vitals reviewed.   Filed Vitals:   03/07/15 1154  BP: 125/82  Pulse: 61  Temp: 97.7 F (36.5 C)  TempSrc: Oral  Resp: 16  Height: 5\' 11"  (1.803 m)  Weight: 210 lb 9.6 oz (95.528 kg)  SpO2: 96%   Results for orders placed or performed in visit on 03/07/15  POCT glucose (manual entry)  Result Value Ref Range   POC Glucose 123 (A) 70 - 99 mg/dl  POCT glycosylated hemoglobin (Hb A1C)  Result Value Ref Range   Hemoglobin A1C 8.0         Assessment & Plan:   Ioanis Carter Suriano is a 54 y.o. male Type 2 diabetes mellitus without complication, without long-term current use of insulin ([place]) - Plan: POCT glucose (manual entry), POCT glycosylated hemoglobin (Hb A1C), Microalbumin, urine, metFORMIN (GLUCOPHAGE) 850 MG tablet  - decreased control. Will increase metformin to 875mg  BID, recheck A1c in 3 months.   PAF (paroxysmal atrial fibrillation) (HCC)  - asymptomatic. Tolerating Pradaxa and Tikosyn. Continue follow up as planned with cardiology.   Coronary artery disease involving native coronary artery of native heart without angina pectoris - Plan:  nitroGLYCERIN (NITROSTAT) 0.4 MG SL tablet, COMPLETE METABOLIC PANEL WITH GFR, Lipid panel  -asymptomatic, but prior NTG expired. Rx given if needed, but if return of symptoms-ER/911 precautions discussed.   Pain in joint of right shoulder Deltoid tendinitis of right shoulder  - deltoid mm strain/tendinitis vs mild impingement.   -relative rest from overuse/overhead activities, rtc if sx's persist in next few weeks.  Tylenol ok if needed.   Hyperlipemia - Plan: COMPLETE METABOLIC PANEL WITH GFR, Lipid panel  - continue Crestor, labs pending.   Meds ordered this encounter  Medications  . nitroGLYCERIN (NITROSTAT) 0.4 MG SL tablet    Sig: Place 1 tablet (0.4 mg total) under the tongue every 5 (five) minutes as needed (if 3rd dose needed call 911 or go to emergency room.). For chest pain.    Dispense:  25 tablet    Refill:  0  . metFORMIN (GLUCOPHAGE) 850 MG tablet    Sig: TAKE 1 TABLET (500 MG TOTAL) BY MOUTH 2 (TWO) TIMES DAILY WITH A MEAL  "OFFICE VISIT NEEDED"    Dispense:  180 tablet    Refill:  1   Patient Instructions  Increase dose of metformin to 850mg  twice per day, recheck in 3 months.  Call Dr. Stanford Breed for appointment.  Return to the clinic or go to the nearest emergency room if any of your symptoms worsen or new symptoms occur.       By signing my name below, I, Scott White, attest that this documentation has been prepared under the direction and in the presence of Scott Ray, MD. Electronically Signed: Moises White, [place]. 03/07/2015 , 12:05 PM .   I personally performed the services described in this documentation, which was scribed in my presence. The recorded information has been reviewed and considered, and addended by me as needed.

## 2015-03-07 NOTE — Patient Instructions (Signed)
Increase dose of metformin to 850mg  twice per day, recheck in 3 months.  Call Dr. Stanford Breed for appointment.  Return to the clinic or go to the nearest emergency room if any of your symptoms worsen or new symptoms occur.

## 2015-03-08 LAB — COMPLETE METABOLIC PANEL WITH GFR
ALBUMIN: 4.5 g/dL (ref 3.6–5.1)
ALK PHOS: 43 U/L (ref 40–115)
ALT: 21 U/L (ref 9–46)
AST: 20 U/L (ref 10–35)
BUN: 12 mg/dL (ref 7–25)
CALCIUM: 9.3 mg/dL (ref 8.6–10.3)
CO2: 26 mmol/L (ref 20–31)
Chloride: 101 mmol/L (ref 98–110)
Creat: 0.82 mg/dL (ref 0.70–1.33)
GFR, Est African American: >89 mL/min (ref >=60)
GFR, Est Non African American: >89 mL/min (ref >=60)
GLUCOSE: 109 mg/dL — AB (ref 65–99)
Potassium: 5 mmol/L (ref 3.5–5.3)
SODIUM: 140 mmol/L (ref 135–146)
Total Bilirubin: 0.7 mg/dL (ref 0.2–1.2)
Total Protein: 7.3 g/dL (ref 6.1–8.1)

## 2015-03-08 LAB — LIPID PANEL
Cholesterol: 164 mg/dL (ref 125–200)
HDL: 32 mg/dL — ABNORMAL LOW (ref >=40)
LDL CALC: 83 mg/dL (ref <130)
TRIGLYCERIDES: 244 mg/dL — AB (ref <150)
Total CHOL/HDL Ratio: 5.1 Ratio — ABNORMAL HIGH (ref <=5.0)
VLDL: 49 mg/dL — AB (ref <30)

## 2015-03-08 LAB — MICROALBUMIN, URINE

## 2015-03-22 ENCOUNTER — Other Ambulatory Visit: Payer: Self-pay

## 2015-03-22 MED ORDER — METOPROLOL SUCCINATE ER 25 MG PO TB24: 25.0000 mg | ORAL_TABLET | Freq: Every day | ORAL | Status: DC

## 2015-03-22 NOTE — Telephone Encounter (Signed)
Consuelo Pandy, PA-C at 07/22/2014 10:06 AM  metoprolol succinate (TOPROL-XL) 25 MG 24 hr tabletTAKE 1 TABLET BY MOUTH EVERY DAY PLAN Continue current regimen. F/u with Dr. Stanford Breed in 6 months. F/u with Dr. [facility]a [facility]ellant clinic if recurrent afib.

## 2015-05-10 ENCOUNTER — Encounter: Payer: Self-pay | Admitting: Cardiology

## 2015-05-10 ENCOUNTER — Ambulatory Visit (INDEPENDENT_AMBULATORY_CARE_PROVIDER_SITE_OTHER): Payer: BLUE CROSS/BLUE SHIELD | Admitting: Cardiology

## 2015-05-10 VITALS — BP 120/88 | HR 72 | Ht 71.0 in | Wt 208.0 lb

## 2015-05-10 DIAGNOSIS — Z79899 Other long term (current) drug therapy: Secondary | ICD-10-CM | POA: Diagnosis not present

## 2015-05-10 NOTE — Progress Notes (Signed)
05/10/2015 Scott White   1961-01-15  [id number]  Primary Physician [name], MD Primary Cardiologist: Dr. Stanford Breed   Reason for Visit/CC: F/u for Atrial Fibrillation  HPI: The patient is a 55 y/o male, followed by Dr. Stanford Breed, with a history of atrial fibrillation, CAD, HTN, DM and HLD. Patient has had previous PCI of his right coronary artery in the setting of an acute infarct in 2004. No obstructive disease in the left system. He has had atrial fibrillation and has undergone previous ablation. Last echocardiogram in April of 2013 showed normal LV function. Nuclear study in October of 2013 showed an ejection fraction of 50%. There was no ischemia. He is on Tikosyn and Pradaxa for his Afib.   He presents to clinic for routine f/u. He reports that he has done well. He denies any symptoms of breakthrough atrial fibrillation. No palpations. No chest pain, dyspnea, syncope or near syncope. He reports full medication compliance and avoidance of excessive alcohol and caffeine. No abnormal bleeding with Pradaxa. No falls. He is followed medically by Dr. Carlota Raspberry, who follows his lipid profile. Last Lipid panel 02/2015 showed an LDL level of 83 mg/dL. He is on statin therapy with Crestor.   EKG today shows NSR. Non ischemic. HR 73 bpm. QT/QTC is stable at 430/454 ms.    Current Outpatient Prescriptions  Medication Sig Dispense Refill  . dabigatran (PRADAXA) 150 MG CAPS capsule Take 1 capsule (150 mg total) by mouth 2 (two) times daily. 180 capsule 3  . dofetilide (TIKOSYN) 500 MCG capsule TAKE ONE CAPSULE TWICE A DAY 180 capsule 1  . lisinopril (PRINIVIL,ZESTRIL) 20 MG tablet Take 1 tablet (20 mg total) by mouth every evening. 30 tablet 5  . metFORMIN (GLUCOPHAGE) 850 MG tablet TAKE 1 TABLET (500 MG TOTAL) BY MOUTH 2 (TWO) TIMES DAILY WITH A MEAL  "OFFICE VISIT NEEDED" 180 tablet 1  . metoprolol succinate (TOPROL-XL) 25 MG 24 hr tablet Take 1 tablet (25 mg total) by mouth daily. 30  tablet 1  . Multiple Vitamins-Minerals (CENTRUM PO) Take 1 tablet by mouth daily.     . nitroGLYCERIN (NITROSTAT) 0.4 MG SL tablet Place 1 tablet (0.4 mg total) under the tongue every 5 (five) minutes as needed (if 3rd dose needed call 911 or go to emergency room.). For chest pain. 25 tablet 0  . Omega-3 Fatty Acids (FISH OIL PO) Take by mouth daily.    . rosuvastatin (CRESTOR) 40 MG tablet Take 1 tablet (40 mg total) by mouth daily. 90 tablet 3   No current facility-administered medications for this visit.    No Known Allergies  Social History   Social History  . Marital Status: Single    Spouse Name: N/A  . Number of Children: 0  . Years of Education: N/A   Occupational History  . Teacher, English as a foreign language    Social History Main Topics  . Smoking status: Never Smoker   . Smokeless tobacco: Never Used  . Alcohol Use: 0.6 oz/week    1 Cans of beer per week     Comment:  three times a week  . Drug Use: No  . Sexual Activity: Yes   Other Topics Concern  . Not on file   Social History Narrative   Pt lives in [place] alone.  Single.   He is a Teacher, English as a foreign language for [employer]     Review of Systems: General: negative for chills, fever, night sweats or weight changes.  Cardiovascular: negative for chest  pain, dyspnea on exertion, edema, orthopnea, palpitations, paroxysmal nocturnal dyspnea or shortness of breath Dermatological: negative for rash Respiratory: negative for cough or wheezing Urologic: negative for hematuria Abdominal: negative for nausea, vomiting, diarrhea, bright red blood per rectum, melena, or hematemesis Neurologic: negative for visual changes, syncope, or dizziness All other systems reviewed and are otherwise negative except as noted above.    Blood pressure 120/88, pulse 72, height 5\' 11"  (1.803 m), weight 208 lb (94.348 kg).  General appearance: alert, cooperative and no distress Neck: no carotid bruit and no JVD Lungs: clear to auscultation  bilaterally Heart: regular rate and rhythm, S1, S2 normal, no murmur, click, rub or gallop Extremities: no LEE Pulses: 2+ and symmetric Skin: warm and dry Neurologic: Grossly normal  EKG NSR. QT/QTc 430/454. Non ischemic.   ASSESSMENT AND PLAN:   1. PAF: h/o ablation by Dr. Rayann Heman in the past. Now on Tikosyn + Pradaxa and reports full compliance. No symptoms of breakthrough atrial fibrillation. EKG shows NSR. HR well controlled on metoprolol. EKG shows stable QT/QTc of 430/454 ms. No abnormal bleeding with Pradaxa. Will check K and Mg levels today, given he is on Tikosyn.   2. CAD: s/p remote MI s/p PCI to the RCA in 2004. No significant residual disease at that time.  He denies any recurrent CP. No exertional symptoms. EKG w/o ischemia. Continue BB, statin and ACE-I.   3.  HTN: well controlled on current regimen  4. HLD: followed by PCP. On statin therapy with Crestor.   5. DM: followed by PCP.   6. OSA: on CPAP.   PLAN  F/u with Dr. Stanford Breed in 6 months  Salene Mohamud, [facility] PA-C 05/10/2015 11:04 AM

## 2015-05-10 NOTE — Patient Instructions (Signed)
Medication Instructions:  Your physician recommends that you continue on your current medications as directed. Please refer to the Current Medication list given to you today.  Labwork: Medication management lab today: Magnesium level  Testing/Procedures: None ordered  Follow-Up: Your physician wants you to follow-up in: 6 months with Dr. Stanford Breed. You will receive a reminder letter in the mail two months in advance. If you don't receive a letter, please call our office to schedule the follow-up appointment.  If you need a refill on your cardiac medications before your next appointment, please call your pharmacy.  Thank you for choosing [facility]!!

## 2015-05-11 LAB — MAGNESIUM: Magnesium: 2 mg/dL (ref 1.5–2.5)

## 2015-05-13 NOTE — Addendum Note (Signed)
Addended by: Freada Bergeron on: 05/13/2015 03:49 PM   Modules accepted: Orders

## 2015-05-16 ENCOUNTER — Other Ambulatory Visit: Payer: Self-pay | Admitting: Cardiology

## 2015-05-16 NOTE — Telephone Encounter (Signed)
Rx(s) sent to pharmacy electronically.  

## 2015-06-13 ENCOUNTER — Ambulatory Visit: Payer: BLUE CROSS/BLUE SHIELD | Admitting: Family Medicine

## 2015-06-15 ENCOUNTER — Ambulatory Visit (INDEPENDENT_AMBULATORY_CARE_PROVIDER_SITE_OTHER): Payer: BLUE CROSS/BLUE SHIELD | Admitting: Family Medicine

## 2015-06-15 ENCOUNTER — Encounter: Payer: Self-pay | Admitting: Family Medicine

## 2015-06-15 VITALS — BP 110/82 | HR 65 | Temp 98.1°F | Resp 16 | Ht 71.5 in | Wt 205.0 lb

## 2015-06-15 DIAGNOSIS — E1165 Type 2 diabetes mellitus with hyperglycemia: Secondary | ICD-10-CM

## 2015-06-15 DIAGNOSIS — IMO0001 Reserved for inherently not codable concepts without codable children: Secondary | ICD-10-CM

## 2015-06-15 DIAGNOSIS — M7580 Other shoulder lesions, unspecified shoulder: Secondary | ICD-10-CM | POA: Diagnosis not present

## 2015-06-15 DIAGNOSIS — E119 Type 2 diabetes mellitus without complications: Secondary | ICD-10-CM | POA: Diagnosis not present

## 2015-06-15 LAB — POCT GLYCOSYLATED HEMOGLOBIN (HGB A1C): Hemoglobin A1C: 6.7

## 2015-06-15 LAB — GLUCOSE, POCT (MANUAL RESULT ENTRY): POC Glucose: 131 mg/dl — AB (ref 70–99)

## 2015-06-15 MED ORDER — METFORMIN HCL 850 MG PO TABS: 850.0000 mg | ORAL_TABLET | Freq: Two times a day (BID) | ORAL | Status: DC

## 2015-06-15 NOTE — Patient Instructions (Addendum)
Can try to decrease bicep exercises and lessen weight with shoulder exercises for a few weeks to see if this helps. If persistent pain or difficulty with range of motion - let me know and I can refer you to orthopaedist.   I do not appreciate a hernia at this time, but if discomfort persists - call your surgeon's office for appointment.  Let me know if they need a referral.    Diabetes is now controlled. No med changes - recheck in 3-6 months.

## 2015-06-15 NOTE — Progress Notes (Addendum)
Subjective:    Patient ID: Scott White, male    DOB: Oct 03, 1960, 55 y.o.   MRN: [id number] By signing my name below, I, Zola Button, attest that this documentation has been prepared under the direction and in the presence of Scott Ray, MD.  Electronically Signed: Zola Button, Medical Scribe. 06/15/2015. 9:18 AM.  HPI HPI Comments: Scott White is a 55 y.o. male with a history of DM, HLD, and CAD who presents to the [facility] for a follow-up for multiple concerns.  Diabetes: On 850 mg metformin BID. Uncontrolled last visit which is why we increased him to that dose. Patient states his blood sugar is normally in the 160s. He denies diarrhea, bloating, gas, and other side effects. Lab Results  Component Value Date   HGBA1C 8.0 03/07/2015    Lab Results  Component Value Date   MICROALBUR <0.2 03/07/2015    History of atrial fibrillation: History of PAF and CAD. Office visit February 21st with cardiology. Continued Pradaxa and Tikosyn.  Right shoulder pain: Suspected deltoid tendonitis at September visit. Now complains of left shoulder symptoms now. The pain in his right shoulder has remained about the same, but began having pain in his left shoulder about 6 weeks ago. He has not tried anything for the pain. Patient only has occasional pain and is only limited in doing certain shoulder exercises in the gym. He is able to perform daily tasks and most exercises without any problems. He does sleep on his side at night.  Right side pain: He had a hernia surgery on the right side in 2015 at [facility]. Patient began noticing right-sided abdominal pain while doing abdominal crunches over a year ago in February 2016. He has been avoiding certain abdominal exercises since then. He has not noticed any bulges or swelling.  Patient Active Problem List   Diagnosis Date Noted  . Complex sleep apnea syndrome 09/22/2010  . Long term (current) use of  anticoagulants 07/03/2010  . Long term current use of anticoagulant 05/31/2010  . DYSLIPIDEMIA 09/27/2008  . HYPERTRIGLYCERIDEMIA 02/11/2008  . ESSENTIAL HYPERTENSION, BENIGN 02/11/2008  . CORONARY ATHEROSCLEROSIS NATIVE CORONARY ARTERY 02/11/2008  . ATRIAL FIBRILLATION 02/11/2008   Past Medical History  Diagnosis Date  . CAD (coronary artery disease)     a. Status post acute inferior wall myocardial infarction emergent bare metal stenting of the mid RCA, May 2004,  residual nonobstructive coronary artery disease Ejection fraction 60% by echocardiography 04/2007  . HTN (hypertension)   . Hyperlipidemia   . Sleep apnea     followed by Dr Gwenette Greet  . Myocardial infarction ([place])   . Atrial fibrillation ([place])     a. Persistent, s/p afib ablation by JA 08/16/10;  b. s/p failed DCCV 05/2011  . CHF (congestive heart failure) ([place])   . Diabetes [facility])    Past Surgical History  Procedure Laterality Date  . Cardiac catheterization  2004     Successful PTCA and stent placement in the mid right   coronary artery with an extra 99% narrowing to 0% with placement of a 3.5 x  20 mm Express 2 stent with improvement of TIMI grade 1 flow to TIMI grade 3.  . Atrial ablatio  08/16/10    afib ablation by JA  . Cardioversion  06/15/2011    Procedure: CARDIOVERSION;  Surgeon: Fay Records, MD;  Location: [place];  Service: Cardiovascular;  Laterality: N/A;  . Tonsilectomy     No  Known Allergies Prior to Admission medications   Medication Sig Start Date End Date Taking? Authorizing Provider  dabigatran (PRADAXA) 150 MG CAPS capsule Take 1 capsule (150 mg total) by mouth 2 (two) times daily. 12/07/14  Yes Brittainy Erie Noe, PA-C  dofetilide (TIKOSYN) 500 MCG capsule TAKE ONE CAPSULE TWICE A DAY 10/07/14  Yes Lelon Perla, MD  lisinopril (PRINIVIL,ZESTRIL) 20 MG tablet Take 1 tablet (20 mg total) by mouth every evening. 01/17/15 01/17/16 Yes Lelon Perla, MD  metFORMIN (GLUCOPHAGE) 850 MG tablet TAKE 1  TABLET (500 MG TOTAL) BY MOUTH 2 (TWO) TIMES DAILY WITH A MEAL  "OFFICE VISIT NEEDED" 03/07/15  Yes [name], MD  metoprolol succinate (TOPROL-XL) 25 MG 24 hr tablet TAKE 1 TABLET (25 MG TOTAL) BY MOUTH DAILY. 05/16/15  Yes Lelon Perla, MD  Multiple Vitamins-Minerals (CENTRUM PO) Take 1 tablet by mouth daily.    Yes Historical Provider, MD  nitroGLYCERIN (NITROSTAT) 0.4 MG SL tablet Place 1 tablet (0.4 mg total) under the tongue every 5 (five) minutes as needed (if 3rd dose needed call 911 or go to emergency room.). For chest pain. 03/07/15  Yes [name], MD  Omega-3 Fatty Acids (FISH OIL PO) Take by mouth daily.   Yes Historical Provider, MD  rosuvastatin (CRESTOR) 40 MG tablet Take 1 tablet (40 mg total) by mouth daily. 10/18/14  Yes Lelon Perla, MD   Social History   Social History  . Marital Status: Single    Spouse Name: N/A  . Number of Children: 0  . Years of Education: N/A   Occupational History  . Teacher, English as a foreign language    Social History Main Topics  . Smoking status: Never Smoker   . Smokeless tobacco: Never Used  . Alcohol Use: 0.6 oz/week    1 Cans of beer per week     Comment:  three times a week  . Drug Use: No  . Sexual Activity: Yes   Other Topics Concern  . Not on file   Social History Narrative   Pt lives in [place] alone.  Single.   He is a Teacher, English as a foreign language for [employer]     Review of Systems  Constitutional: Negative for fatigue and unexpected weight change.  Eyes: Negative for visual disturbance.  Respiratory: Negative for cough, chest tightness and shortness of breath.   Cardiovascular: Negative for chest pain, palpitations and leg swelling.  Gastrointestinal: Positive for abdominal pain. Negative for diarrhea, blood in stool and abdominal distention.  Musculoskeletal: Positive for arthralgias.  Neurological: Negative for dizziness, light-headedness and headaches.       Objective:   Physical Exam  Constitutional:  He is oriented to person, place, and time. He appears well-developed and well-nourished. No distress.  HENT:  Head: Normocephalic and atraumatic.  Mouth/Throat: Oropharynx is clear and moist. No oropharyngeal exudate.  Eyes: EOM are normal. Pupils are equal, round, and reactive to light.  Neck: Neck supple. No JVD present. Carotid bruit is not present.  Cardiovascular: Normal rate, regular rhythm and normal heart sounds.   No murmur heard. Pulmonary/Chest: Effort normal and breath sounds normal. He has no rales.  Abdominal:  No abdominal wall defect, including with half sit-up.  Genitourinary:  No hernia palpated.  Musculoskeletal: He exhibits tenderness. He exhibits no edema.  Shoulders full ROM, but soreness with arm elevation. Slight tenderness over left and right anterior deltoid proximally. Minimal tenderness over bicipital groove bilaterally.  Neurological: He is alert and oriented to  person, place, and time. No cranial nerve deficit.  Skin: Skin is warm and dry. No rash noted.  Psychiatric: He has a normal mood and affect. His behavior is normal.  Nursing note and vitals reviewed.  Filed Vitals:   06/15/15 0830  BP: 110/82  Pulse: 65  Temp: 98.1 F (36.7 C)  TempSrc: Oral  Resp: 16  Height: 5' 11.5" (1.816 m)  Weight: 205 lb (92.987 kg)  SpO2: 99%    Results for orders placed or performed in visit on 06/15/15  POCT glucose (manual entry)  Result Value Ref Range   POC Glucose 131 (A) 70 - 99 mg/dl  POCT glycosylated hemoglobin (Hb A1C)  Result Value Ref Range   Hemoglobin A1C 6.7        Assessment & Plan:   Rashed Hapner Mower is a 55 y.o. male Uncontrolled type 2 diabetes mellitus without complication, without long-term current use of insulin ([place]) - Plan: HM Diabetes Foot Exam, POCT glucose (manual entry), POCT glycosylated hemoglobin (Hb A1C)  - Now controlled. Commended on weight loss and diet efforts. We'll continue same dose of metformin at  850 mg twice a day.  Recheck in 3 months.  Deltoid tendinitis, unspecified laterality  -Initially right-sided, now left-sided. May have a component of bicipital tendinitis. Temporarily avoid bicep exercises and deltoid specific exercises, then slow resumption of activity,.  If impact on range of motion or persistent discomfort, refer to orthopedics for further evaluation.  Discomfort in area of previous surgery. Do not appreciate a hernia on exam in office. Advised if persistent pain, follow-up with surgeon. May be more abdominal wall discomfort or nerve impingement, but again no hernia palpated at this time.  Meds ordered this encounter  Medications  . metFORMIN (GLUCOPHAGE) 850 MG tablet    Sig: Take 1 tablet (850 mg total) by mouth 2 (two) times daily with a meal.    Dispense:  180 tablet    Refill:  1   Patient Instructions  Can try to decrease bicep exercises and lessen weight with shoulder exercises for a few weeks to see if this helps. If persistent pain or difficulty with range of motion - let me know and I can refer you to orthopaedist.   I do not appreciate a hernia at this time, but if discomfort persists - call your surgeon's office for appointment.  Let me know if they need a referral.    Diabetes is now controlled. No med changes - recheck in 3-6 months.     I personally performed the services described in this documentation, which was scribed in my presence. The recorded information has been reviewed and considered, and addended by me as needed.    I personally performed the services described in this documentation, which was scribed in my presence. The recorded information has been reviewed and considered, and addended by me as needed.

## 2015-06-30 ENCOUNTER — Other Ambulatory Visit: Payer: Self-pay | Admitting: *Deleted

## 2015-06-30 MED ORDER — LISINOPRIL 20 MG PO TABS: 20.0000 mg | ORAL_TABLET | Freq: Every evening | ORAL | Status: DC

## 2015-07-14 ENCOUNTER — Other Ambulatory Visit: Payer: Self-pay | Admitting: Cardiology

## 2015-07-14 NOTE — Telephone Encounter (Signed)
Rx has been sent to the pharmacy electronically. ° °

## 2015-08-22 DIAGNOSIS — L814 Other melanin hyperpigmentation: Secondary | ICD-10-CM | POA: Diagnosis not present

## 2015-08-22 DIAGNOSIS — D1801 Hemangioma of skin and subcutaneous tissue: Secondary | ICD-10-CM | POA: Diagnosis not present

## 2015-08-22 DIAGNOSIS — L821 Other seborrheic keratosis: Secondary | ICD-10-CM | POA: Diagnosis not present

## 2015-08-22 DIAGNOSIS — Z872 Personal history of diseases of the skin and subcutaneous tissue: Secondary | ICD-10-CM | POA: Diagnosis not present

## 2015-10-31 DIAGNOSIS — G4733 Obstructive sleep apnea (adult) (pediatric): Secondary | ICD-10-CM | POA: Diagnosis not present

## 2015-11-16 ENCOUNTER — Ambulatory Visit: Payer: BLUE CROSS/BLUE SHIELD | Admitting: Family Medicine

## 2015-11-17 ENCOUNTER — Ambulatory Visit (INDEPENDENT_AMBULATORY_CARE_PROVIDER_SITE_OTHER): Payer: BLUE CROSS/BLUE SHIELD | Admitting: Family Medicine

## 2015-11-17 ENCOUNTER — Encounter: Payer: Self-pay | Admitting: Family Medicine

## 2015-11-17 VITALS — BP 110/70 | HR 72 | Temp 98.1°F | Resp 18 | Ht 71.5 in | Wt 203.2 lb

## 2015-11-17 DIAGNOSIS — I10 Essential (primary) hypertension: Secondary | ICD-10-CM | POA: Diagnosis not present

## 2015-11-17 DIAGNOSIS — E785 Hyperlipidemia, unspecified: Secondary | ICD-10-CM

## 2015-11-17 DIAGNOSIS — E119 Type 2 diabetes mellitus without complications: Secondary | ICD-10-CM | POA: Diagnosis not present

## 2015-11-17 LAB — COMPLETE METABOLIC PANEL WITH GFR
ALT: 17 U/L (ref 9–46)
AST: 18 U/L (ref 10–35)
Albumin: 4.7 g/dL (ref 3.6–5.1)
Alkaline Phosphatase: 44 U/L (ref 40–115)
BILIRUBIN TOTAL: 0.7 mg/dL (ref 0.2–1.2)
BUN: 11 mg/dL (ref 7–25)
CALCIUM: 9.6 mg/dL (ref 8.6–10.3)
CHLORIDE: 103 mmol/L (ref 98–110)
CO2: 27 mmol/L (ref 20–31)
CREATININE: 0.88 mg/dL (ref 0.70–1.33)
Glucose, Bld: 106 mg/dL — ABNORMAL HIGH (ref 65–99)
Potassium: 4.9 mmol/L (ref 3.5–5.3)
Sodium: 140 mmol/L (ref 135–146)
Total Protein: 7.4 g/dL (ref 6.1–8.1)

## 2015-11-17 LAB — LIPID PANEL
CHOLESTEROL: 234 mg/dL — AB (ref 125–200)
HDL: 38 mg/dL — AB (ref >=40)
LDL Cholesterol: 135 mg/dL — ABNORMAL HIGH (ref <130)
TRIGLYCERIDES: 306 mg/dL — AB (ref <150)
Total CHOL/HDL Ratio: 6.2 Ratio — ABNORMAL HIGH (ref <=5.0)
VLDL: 61 mg/dL — AB (ref <30)

## 2015-11-17 LAB — HEMOGLOBIN A1C
HEMOGLOBIN A1C: 6 % — AB (ref <5.7)
MEAN PLASMA GLUCOSE: 126 mg/dL

## 2015-11-17 MED ORDER — METFORMIN HCL 850 MG PO TABS: 850.0000 mg | ORAL_TABLET | Freq: Two times a day (BID) | ORAL | 1 refills | Status: DC

## 2015-11-17 MED ORDER — ROSUVASTATIN CALCIUM 40 MG PO TABS: 40.0000 mg | ORAL_TABLET | Freq: Every day | ORAL | 3 refills | Status: DC

## 2015-11-17 NOTE — Patient Instructions (Addendum)
No change in meds for now. If cholesterol elevated as off meds - can place fasting lab order in 6 weeks once you are back on meds.    IF you received an x-ray today, you will receive an invoice from [facility]. Please contact [facility] at [phone] with questions or concerns regarding your invoice.   IF you received labwork today, you will receive an invoice from [employer]. Please contact [facility] at [phone] with questions or concerns regarding your invoice.   Our billing staff will not be able to assist you with questions regarding bills from these companies.  You will be contacted with the lab results as soon as they are available. The fastest way to get your results is to activate your My Chart account. Instructions are located on the last page of this paperwork. If you have not heard from [place] regarding the results in 2 weeks, please contact this office.

## 2015-11-17 NOTE — Progress Notes (Signed)
By signing my name below, I, Mesha Guinyard, attest that this documentation has been prepared under the direction and in the presence of Merri Ray.  Electronically Signed: Verlee Monte, Medical Scribe. 11/17/15. 8:15 AM.  Subjective:    Patient ID: Scott White, male    DOB: 1960-11-19, 55 y.o.   MRN: [id number]  HPI Chief Complaint  Patient presents with  . Follow-up    diabetes    HPI Comments: Scott White is a 55 y.o. male with a PMHx of HTN, DM, and HLD who presents to the [facility] for DM follow-up. Pt was last seen by me 3/29 for DM follow-up. He had been increased to 850 mg of Metformin at previous and was controlled in March. Continue same dose of 850 mg as directed.  A. Fib: Pt takes Tikosyn and states the CPAP helps his symptoms. Pt mentions with out the CPAP machine he would go out of rhythm 2-3 times a week. Pt denies experiencing any negative side effects while on this medication.  HTN: Takes Lisinopril 20 mg QD, Toprol-XL 25 mg QD. Pt reports ED and suspects it's from Toprol-XL. Pt is interested in getting another HTN medication.  Lab Results  Component Value Date   CREATININE 0.82 03/07/2015   DM: Takes Metformin 850 mg BID. Pt's blood sugar will spike to 180-200 depending on what he ate before checking it, but otherwise controlled. Pt last saw his ophthalmologist 3 months ago and didn't have any diabetic retinopathy. Pt is followed by a dentist who is aware of his DM. Pt tries to go to the gym 3-4 times a day in a week, or walk if he can't make it to the gym. Pt denies experiencing any diabetic changes.  Lab Results  Component Value Date   HGBA1C 6.7 06/15/2015   Lab Results  Component Value Date   MICROALBUR <0.2 03/07/2015   HLD: Takes Crestor 40 mg. Pt has been off of Crestor for 4 weeks because pt felt knee pain while on this medication. Lab Results  Component Value Date   CHOL 164 03/07/2015   HDL 32 (L) 03/07/2015     LDLCALC 83 03/07/2015   LDLDIRECT 92.8 11/04/2012   TRIG 244 (H) 03/07/2015   CHOLHDL 5.1 (H) 03/07/2015   Lab Results  Component Value Date   ALT 21 03/07/2015   AST 20 03/07/2015   ALKPHOS 43 03/07/2015   BILITOT 0.7 03/07/2015    Patient Active Problem List   Diagnosis Date Noted  . Complex sleep apnea syndrome 09/22/2010  . Long term (current) use of anticoagulants 07/03/2010  . Long term current use of anticoagulant 05/31/2010  . DYSLIPIDEMIA 09/27/2008  . HYPERTRIGLYCERIDEMIA 02/11/2008  . ESSENTIAL HYPERTENSION, BENIGN 02/11/2008  . CORONARY ATHEROSCLEROSIS NATIVE CORONARY ARTERY 02/11/2008  . ATRIAL FIBRILLATION 02/11/2008   Past Medical History:  Diagnosis Date  . Atrial fibrillation ([place])    a. Persistent, s/p afib ablation by JA 08/16/10;  b. s/p failed DCCV 05/2011  . CAD (coronary artery disease)    a. Status post acute inferior wall myocardial infarction emergent bare metal stenting of the mid RCA, May 2004,  residual nonobstructive coronary artery disease Ejection fraction 60% by echocardiography 04/2007  . CHF (congestive heart failure) ([place])   . Diabetes ([place])   . HTN (hypertension)   . Hyperlipidemia   . Myocardial infarction ([place])   . Sleep apnea    followed by Dr Gwenette Greet   Past Surgical History:  Procedure  Laterality Date  . atrial ablatio  08/16/10   afib ablation by JA  . CARDIAC CATHETERIZATION  2004    Successful PTCA and stent placement in the mid right   coronary artery with an extra 99% narrowing to 0% with placement of a 3.5 x  20 mm Express 2 stent with improvement of TIMI grade 1 flow to TIMI grade 3.  . CARDIOVERSION  06/15/2011   Procedure: CARDIOVERSION;  Surgeon: Fay Records, MD;  Location: [place];  Service: Cardiovascular;  Laterality: N/A;  . tonsilectomy     No Known Allergies Prior to Admission medications   Medication Sig Start Date End Date Taking? Authorizing Provider  dabigatran (PRADAXA) 150 MG CAPS capsule Take 1 capsule (150  mg total) by mouth 2 (two) times daily. 12/07/14   Brittainy Erie Noe, PA-C  dofetilide (TIKOSYN) 500 MCG capsule TAKE ONE CAPSULE TWICE A DAY 07/14/15   Lelon Perla, MD  lisinopril (PRINIVIL,ZESTRIL) 20 MG tablet Take 1 tablet (20 mg total) by mouth every evening. 06/30/15 06/29/16  Lelon Perla, MD  metFORMIN (GLUCOPHAGE) 850 MG tablet Take 1 tablet (850 mg total) by mouth 2 (two) times daily with a meal. 06/15/15   [name], MD  metoprolol succinate (TOPROL-XL) 25 MG 24 hr tablet TAKE 1 TABLET (25 MG TOTAL) BY MOUTH DAILY. 05/16/15   Lelon Perla, MD  Multiple Vitamins-Minerals (CENTRUM PO) Take 1 tablet by mouth daily.     Historical Provider, MD  nitroGLYCERIN (NITROSTAT) 0.4 MG SL tablet Place 1 tablet (0.4 mg total) under the tongue every 5 (five) minutes as needed (if 3rd dose needed call 911 or go to emergency room.). For chest pain. 03/07/15   [name], MD  Omega-3 Fatty Acids (FISH OIL PO) Take by mouth daily.    Historical Provider, MD  rosuvastatin (CRESTOR) 40 MG tablet Take 1 tablet (40 mg total) by mouth daily. 10/18/14   Lelon Perla, MD   Social History   Social History  . Marital status: Single    Spouse name: N/A  . Number of children: 0  . Years of education: N/A   Occupational History  . Teacher, English as a foreign language [employer]   Social History Main Topics  . Smoking status: Never Smoker  . Smokeless tobacco: Never Used  . Alcohol use 0.6 oz/week    1 Cans of beer per week     Comment:  three times a week  . Drug use: No  . Sexual activity: Yes   Other Topics Concern  . Not on file   Social History Narrative   Pt lives in [place] alone.  Single.   He is a Teacher, English as a foreign language for [employer]   Depression screen [facility] 2/9 11/17/2015 06/15/2015 03/07/2015 10/25/2014 07/19/2014  Decreased Interest 0 0 0 0 0  Down, Depressed, Hopeless 0 0 0 0 0  PHQ - 2 Score 0 0 0 0 0   Review of Systems  Constitutional: Negative for fatigue and  unexpected weight change.  Eyes: Negative for visual disturbance.  Respiratory: Negative for cough, chest tightness and shortness of breath.   Cardiovascular: Negative for chest pain, palpitations and leg swelling.  Gastrointestinal: Negative for abdominal pain and blood in stool.  Musculoskeletal: Positive for arthralgias.  Neurological: Negative for dizziness, light-headedness and headaches.   Objective:  Physical Exam  Constitutional: He is oriented to person, place, and time. He appears well-developed and well-nourished.  HENT:  Head: Normocephalic and  atraumatic.  Eyes: EOM are normal. Pupils are equal, round, and reactive to light.  Neck: No JVD present. Carotid bruit is not present.  Cardiovascular: Normal rate, regular rhythm and normal heart sounds.  Exam reveals no friction rub.   No murmur heard. Pulmonary/Chest: Effort normal and breath sounds normal. He has no rales.  Musculoskeletal: He exhibits no edema.  Neurological: He is alert and oriented to person, place, and time.  Skin: Skin is warm and dry.  Psychiatric: He has a normal mood and affect.  Vitals reviewed.  BP 110/70   Pulse 72   Temp 98.1 F (36.7 C) (Oral)   Resp 18   Ht 5' 11.5" (1.816 m)   Wt 203 lb 3.2 oz (92.2 kg)   SpO2 97%   BMI 27.95 kg/m  Assessment & Plan:   Scott White is a 55 y.o. male Essential hypertension  - Stable.  continue lisinopril and metoprolol same doses. Some erectile dysfunction, may be indirectly related to beta blocker. Advised to discuss further with cardiology, and if they clear him to use a medication like Levitra or Viagra, can call me or email me to discuss that prescription.  Type 2 diabetes mellitus without complication, without long-term current use of insulin ([facility]) - Plan: Hemoglobin A1C, metFORMIN (GLUCOPHAGE) 850 MG tablet  - Previously controlled. Tolerating meds, A1c pending. No change in meds for now.  Hyperlipidemia - Plan: COMPLETE METABOLIC PANEL WITH  GFR, Lipid panel, rosuvastatin (CRESTOR) 40 MG tablet  - Off statin for past few weeks. Baseline lipids and CMP were drawn, but likely will need a fasting lab order for 6 week lipid testing once he is back on Crestor. rtc precautions given if new side effects of Crestor.   Meds ordered this encounter  Medications  . rosuvastatin (CRESTOR) 40 MG tablet    Sig: Take 1 tablet (40 mg total) by mouth daily.    Dispense:  90 tablet    Refill:  3  . metFORMIN (GLUCOPHAGE) 850 MG tablet    Sig: Take 1 tablet (850 mg total) by mouth 2 (two) times daily with a meal.    Dispense:  180 tablet    Refill:  1   Patient Instructions   No change in meds for now. If cholesterol elevated as off meds - can place fasting lab order in 6 weeks once you are back on meds.    IF you received an x-ray today, you will receive an invoice from [facility]. Please contact [facility] at [phone] with questions or concerns regarding your invoice.   IF you received labwork today, you will receive an invoice from [employer]. Please contact [facility] at [phone] with questions or concerns regarding your invoice.   Our billing staff will not be able to assist you with questions regarding bills from these companies.  You will be contacted with the lab results as soon as they are available. The fastest way to get your results is to activate your My Chart account. Instructions are located on the last page of this paperwork. If you have not heard from [place] regarding the results in 2 weeks, please contact this office.       I personally performed the services described in this documentation, which was scribed in my presence. The recorded information has been reviewed and considered, and addended by me as needed.   Signed,   Merri Ray, MD [facility].  11/17/15 8:31  AM

## 2015-11-18 ENCOUNTER — Other Ambulatory Visit: Payer: Self-pay | Admitting: Cardiology

## 2015-12-03 NOTE — Addendum Note (Signed)
Addended by: Merri Ray R on: 12/03/2015 03:54 PM   Modules accepted: Orders

## 2016-01-20 ENCOUNTER — Other Ambulatory Visit: Payer: Self-pay | Admitting: Cardiology

## 2016-03-01 DIAGNOSIS — G4733 Obstructive sleep apnea (adult) (pediatric): Secondary | ICD-10-CM | POA: Diagnosis not present

## 2016-03-28 ENCOUNTER — Other Ambulatory Visit: Payer: Self-pay | Admitting: Cardiology

## 2016-05-08 ENCOUNTER — Encounter: Payer: Self-pay | Admitting: Cardiology

## 2016-05-09 ENCOUNTER — Telehealth: Payer: Self-pay | Admitting: *Deleted

## 2016-05-09 NOTE — Telephone Encounter (Signed)
MyChart message received:  Scott White,  REF: A-Fib.  Noticed my a-fib has returned. Made note last Thursday morning as I awoke - initial BP reading, then after I was dressed another BP reading was taken with a normal reading and a normal reading Thursday evening. However Sunday, Monday and again Tuesday all 3 BP readings have the same results: pulse, pulse, pause, then pulse, pulse, pause etc. The icon on the BP cuff confirms the trend as well. - Need to set-up an appointment but was not able to do so via the internet. Please pass this email to the scheduling group.   Still utilize my CPAP.   Added some additional magnesium to my diet - self prescribed - about 400 mg per day (200 am pm) hoping that would head off the A-fib noted last Thursday.  Regards,  Scott White    Attempt to call patient, no answer-lmtcb.

## 2016-05-10 NOTE — Telephone Encounter (Signed)
Patient has a follow up next week with APP.

## 2016-05-15 ENCOUNTER — Ambulatory Visit (INDEPENDENT_AMBULATORY_CARE_PROVIDER_SITE_OTHER): Payer: BLUE CROSS/BLUE SHIELD | Admitting: Nurse Practitioner

## 2016-05-15 ENCOUNTER — Encounter: Payer: Self-pay | Admitting: Nurse Practitioner

## 2016-05-15 VITALS — BP 120/60 | HR 69 | Ht 71.0 in | Wt 205.0 lb

## 2016-05-15 DIAGNOSIS — E782 Mixed hyperlipidemia: Secondary | ICD-10-CM | POA: Diagnosis not present

## 2016-05-15 DIAGNOSIS — I480 Paroxysmal atrial fibrillation: Secondary | ICD-10-CM | POA: Diagnosis not present

## 2016-05-15 DIAGNOSIS — I10 Essential (primary) hypertension: Secondary | ICD-10-CM | POA: Diagnosis not present

## 2016-05-15 DIAGNOSIS — I2510 Atherosclerotic heart disease of native coronary artery without angina pectoris: Secondary | ICD-10-CM

## 2016-05-15 DIAGNOSIS — I493 Ventricular premature depolarization: Secondary | ICD-10-CM | POA: Diagnosis not present

## 2016-05-15 LAB — BASIC METABOLIC PANEL
BUN: 12 mg/dL (ref 7–25)
CALCIUM: 9.4 mg/dL (ref 8.6–10.3)
CO2: 27 mmol/L (ref 20–31)
Chloride: 103 mmol/L (ref 98–110)
Creat: 0.92 mg/dL (ref 0.70–1.33)
GLUCOSE: 114 mg/dL — AB (ref 65–99)
Potassium: 4.8 mmol/L (ref 3.5–5.3)
SODIUM: 139 mmol/L (ref 135–146)

## 2016-05-15 LAB — MAGNESIUM: MAGNESIUM: 2 mg/dL (ref 1.5–2.5)

## 2016-05-15 NOTE — Progress Notes (Signed)
Office Visit    Patient Name: Scott White Date of Encounter: 05/15/2016  Primary Care Provider:  [name], MD Primary Cardiologist:  B. Stanford Breed, MD / J. Allred, MD   Chief Complaint    56 year old male with a prior history of atrial fibrillation status post catheter ablation, coronary artery disease status post RCA stenting, hypertension, hyperlipidemia, diabetes, and sleep apnea, who presents for follow-up related to recent irregular heartbeats.  Past Medical History    Past Medical History:  Diagnosis Date  . Atrial fibrillation ([place])    a. Persistent, s/p afib ablation by JA 08/16/10;  b. s/p failed DCCV 05/2011; c. Chronic Tikosyn & Pradaxa (CHA2DS2VASc = 3-4).  [name] CAD (coronary artery disease)    a. 07/2002 Inf MI s/p BMS -->RCA, otw nonobs dzs;  b. 12/2011 Neg MV.  . Chronic diastolic CHF (congestive heart failure) ([place])   . Diabetes ([place])   . HTN (hypertension)   . Hyperlipidemia   . PVC's (premature ventricular contractions)   . Sleep apnea    followed by Dr Gwenette Greet   Past Surgical History:  Procedure Laterality Date  . atrial ablatio  08/16/10   afib ablation by JA  . CARDIAC CATHETERIZATION  2004    Successful PTCA and stent placement in the mid right   coronary artery with an extra 99% narrowing to 0% with placement of a 3.5 x  20 mm Express 2 stent with improvement of TIMI grade 1 flow to TIMI grade 3.  . CARDIOVERSION  06/15/2011   Procedure: CARDIOVERSION;  Surgeon: Fay Records, MD;  Location: [facility] OR;  Service: Cardiovascular;  Laterality: N/A;  . tonsilectomy      Allergies  No Known Allergies  History of Present Illness    56 year old male with the above complex past medical history including coronary artery disease status post inferior myocardial infarction May 2004 with stenting of the right coronary artery. He otherwise had nonobstructive disease. Myoview in October 2013 was negative. He also has a history of atrial fibrillation status post  catheter ablation in May 2012. He subsequently had recurrent atrial fibrillation required initiation of Tikosyn therapy. He's been maintained on Tikosyn as well as Pradaxa. LV function was previous noted to be normal by echocardiogram in 2013. Other history includes hypertension, hyperlipidemia, diabetes, and sleep apnea. He is compliant with his CPAP.  He was last seen in clinic 1 year ago. He has done well over the past year. He exercises 3 times per week without any recent change in exercise tolerance, chest pain, or dyspnea. He continues to work full-time. Last week, he had periodic palpitations throughout the day on February 17. When he checked his blood pressure, his cuff showed an irregular heart rhythm alert. He was concerned that perhaps he was having recurrent A. fib and arrange for appointment. Since then, his cuff has periodically shown an irregular heart rhythm but on some days, it has been just fine. He has not been particularly symptomatic. In the past, fatigue was his primary symptom when he had recurrent atrial fibrillation. As noted above, he has had no change in exercise tolerance. He denies PND, orthopnea, dizziness, syncope, edema, or early satiety.  Home Medications    Prior to Admission medications   Medication Sig Start Date End Date Taking? Authorizing Provider  dofetilide (TIKOSYN) 500 MCG capsule TAKE ONE CAPSULE TWICE A DAY 03/28/16  Yes Lelon Perla, MD  lisinopril (PRINIVIL,ZESTRIL) 20 MG tablet Take 1 tablet (20 mg total) by  mouth every evening. 06/30/15 06/29/16 Yes Lelon Perla, MD  Magnesium 200 MG TABS Take 1 tablet by mouth daily.   Yes Historical Provider, MD  metFORMIN (GLUCOPHAGE) 850 MG tablet Take 1 tablet (850 mg total) by mouth 2 (two) times daily with a meal. 11/17/15  Yes [name], MD  metoprolol succinate (TOPROL-XL) 25 MG 24 hr tablet TAKE 1 TABLET (25 MG TOTAL) BY MOUTH DAILY. 01/20/16  Yes Lelon Perla, MD  Multiple Vitamins-Minerals  (CENTRUM PO) Take 1 tablet by mouth daily.    Yes Historical Provider, MD  nitroGLYCERIN (NITROSTAT) 0.4 MG SL tablet Place 1 tablet (0.4 mg total) under the tongue every 5 (five) minutes as needed (if 3rd dose needed call 911 or go to emergency room.). For chest pain. 03/07/15  Yes [name], MD  Omega-3 Fatty Acids (FISH OIL PO) Take by mouth daily.   Yes Historical Provider, MD  PRADAXA 150 MG CAPS capsule TAKE 1 CAPSULE (150 MG TOTAL) BY MOUTH 2 (TWO) TIMES DAILY. 11/18/15  Yes Lelon Perla, MD  rosuvastatin (CRESTOR) 40 MG tablet Take 1 tablet (40 mg total) by mouth daily. 11/17/15  Yes [name], MD    Review of Systems    As above, he did note intermittent palpitations on February 17. He has otherwise been well. He denies chest pain, dyspnea, PND, orthopnea, dizziness, syncope, edema, or early satiety.  All other systems reviewed and are otherwise negative except as noted above.  Physical Exam    VS:  BP 120/60   Pulse 69   Ht 5\' 11"  (1.803 m)   Wt 205 lb (93 kg)   BMI 28.59 kg/m  , BMI Body mass index is 28.59 kg/m. GEN: Well nourished, well developed, in no acute distress.  HEENT: normal.  Neck: Supple, no JVD, carotid bruits, or masses. Cardiac: Irregular with what sounds like trigeminy. No murmurs, rubs, or gallops. No clubbing, cyanosis, edema.  Radials/DP/PT 2+ and equal bilaterally.  Respiratory:  Respirations regular and unlabored, clear to auscultation bilaterally. GI: Soft, nontender, nondistended, BS + x 4. MS: no deformity or atrophy. Skin: warm and dry, no rash. Neuro:  Strength and sensation are intact. Psych: Normal affect.  Accessory Clinical Findings    ECG - sinus arrhythmia, pac's, pvc's, rightward axis. Inferior T-wave inversion-no acute changes.  Assessment & Plan    1.  PVCs: Patient reports prior history of PVCs and recently noted irregular heart rhythm alert on his blood pressure cuff. With the exception of one day, he has been a  symptomatic. He was concerned that he had gone back into atrial fibrillation. EKG today shows sinus rhythm with PACs and PVCs. He is asymptomatic. QTC is stable. I will arrange for follow-up basic metabolic panel and magnesium. He otherwise remains on beta blocker therapy.  2. Paroxysmal atrial fibrillation: Status post prior ablation and subsequent cardioversion. He has since been maintained on Tikosyn and Pradaxa therapy. He is compliant with medications. He is concerned about recurrent A. fib as outlined above, however he is in sinus rhythm with PACs and PVCs. Follow-up basic metabolic panel and magnesium today.  3. Coronary artery disease: He has not been having any chest discomfort over the past year. He remains active, exercising 3 times per week without symptoms or limitations. He remains on beta blocker, ACE inhibitor, and statin therapy. He is not on aspirin because he is on Pradaxa.  4. Essential hypertension: Blood pressure is stable on beta blocker and ACE  inhibitor.  5. Hyperlipidemia: Lipids are followed by his primary care provider. Remains on high potency statin therapy.  6. Obstructive sleep apnea: He reports compliance with CPAP.  7. Type 2 diabetes mellitus: He is on metformin and is followed by primary care.  8. Disposition: Follow-up labs today. Follow up with Dr. Stanford Breed in 6-12 months.   Murray Hodgkins, NP 05/15/2016, 8:28 AM

## 2016-05-15 NOTE — Patient Instructions (Signed)
Medication Instructions:  Your physician recommends that you continue on your current medications as directed. Please refer to the Current Medication list given to you today.   Labwork: TODAY BMET, MAGNESIUM LEVEL  Testing/Procedures: NONE  Follow-Up: Your physician wants you to follow-up in: [place] DR. CRENSHAW. You will receive a reminder letter in the mail two months in advance. If you don't receive a letter, please call our office to schedule the follow-up appointment.   Any Other Special Instructions Will Be Listed Below (If Applicable).     If you need a refill on your cardiac medications before your next appointment, please call your pharmacy.

## 2016-05-17 DIAGNOSIS — G4733 Obstructive sleep apnea (adult) (pediatric): Secondary | ICD-10-CM | POA: Diagnosis not present

## 2016-05-28 ENCOUNTER — Other Ambulatory Visit: Payer: Self-pay | Admitting: Cardiology

## 2016-06-16 DIAGNOSIS — G4733 Obstructive sleep apnea (adult) (pediatric): Secondary | ICD-10-CM | POA: Diagnosis not present

## 2016-06-25 ENCOUNTER — Ambulatory Visit (INDEPENDENT_AMBULATORY_CARE_PROVIDER_SITE_OTHER): Payer: BLUE CROSS/BLUE SHIELD | Admitting: Family Medicine

## 2016-06-25 VITALS — BP 134/75 | HR 83 | Temp 98.0°F | Resp 16 | Ht 71.0 in | Wt 205.4 lb

## 2016-06-25 DIAGNOSIS — I10 Essential (primary) hypertension: Secondary | ICD-10-CM

## 2016-06-25 DIAGNOSIS — I4891 Unspecified atrial fibrillation: Secondary | ICD-10-CM

## 2016-06-25 DIAGNOSIS — E119 Type 2 diabetes mellitus without complications: Secondary | ICD-10-CM

## 2016-06-25 DIAGNOSIS — E785 Hyperlipidemia, unspecified: Secondary | ICD-10-CM

## 2016-06-25 MED ORDER — METFORMIN HCL 850 MG PO TABS: 850.0000 mg | ORAL_TABLET | Freq: Two times a day (BID) | ORAL | 1 refills | Status: DC

## 2016-06-25 MED ORDER — ROSUVASTATIN CALCIUM 40 MG PO TABS: 40.0000 mg | ORAL_TABLET | Freq: Every day | ORAL | 1 refills | Status: DC

## 2016-06-25 NOTE — Patient Instructions (Addendum)
No change im meds, recheck in 30months.     IF you received an x-ray today, you will receive an invoice from [facility]. Please contact [facility] at [phone] with questions or concerns regarding your invoice.   IF you received labwork today, you will receive an invoice from [place]. Please contact [facility] at [phone] with questions or concerns regarding your invoice.   Our billing staff will not be able to assist you with questions regarding bills from these companies.  You will be contacted with the lab results as soon as they are available. The fastest way to get your results is to activate your My Chart account. Instructions are located on the last page of this paperwork. If you have not heard from [place] regarding the results in 2 weeks, please contact this office.

## 2016-06-25 NOTE — Progress Notes (Signed)
By signing my name below, I, Mesha Guinyard, attest that this documentation has been prepared under the direction and in the presence of Merri Ray, MD.  Electronically Signed: Verlee Monte, Medical Scribe. 06/25/16. 8:23 AM.  Subjective:    Patient ID: Scott White, male    DOB: 1960-07-03, 56 y.o.   MRN: [id number]  HPI Chief Complaint  Patient presents with  . A1c    check    HPI Comments: Scott White is a 56 y.o. male with a PMHx of HTN, DM, and A. Fib who presents to the Primary Care at [facility] for HTN and DM follow-up. Pt is fasting today.  DM: Takes metformin 850 mg BID. He is on a ACE inhibitor, and statin. Blood sugar runs higher in the morning and runs around 180 in the day. Pt is compliant with metformin and denies experiencing any negative side effects. Lab Results  Component Value Date   HGBA1C 6.0 (H) 11/17/2015   Lab Results  Component Value Date   MICROALBUR <0.2 03/07/2015   HTN: Takes lisinopril 20 mg  Pt is compliant with lisinopril. He reports dizziness when standing for the past 10 years occasionally but no new sxs. Denies abdominal pain, blood in the stool, melena, or other negative side effects of meds. Lab Results  Component Value Date   CREATININE 0.92 05/15/2016   BP Readings from Last 3 Encounters:  06/25/16 134/75  05/15/16 120/60  11/17/15 110/70   HLD: Takes crestor 40 mg QD. Pt is compliant with crestor. No new myalgias/ side effects.  Lab Results  Component Value Date   CHOL 234 (H) 11/17/2015   HDL 38 (L) 11/17/2015   LDLCALC 135 (H) 11/17/2015   LDLDIRECT 92.8 11/04/2012   TRIG 306 (H) 11/17/2015   CHOLHDL 6.2 (H) 11/17/2015   A. Fib: Followed by Dr. Stanford Breed. Takes tikosyn, toprol-xl, and pradaxa. Now recently having PVCs and he continues to take OTC magnesium - took before his recent diagnosis of PVCs and recent magnesium level 2.0  Patient Active Problem List   Diagnosis Date Noted  .  Complex sleep apnea syndrome 09/22/2010  . Long term (current) use of anticoagulants 07/03/2010  . Long term current use of anticoagulant 05/31/2010  . DYSLIPIDEMIA 09/27/2008  . HYPERTRIGLYCERIDEMIA 02/11/2008  . ESSENTIAL HYPERTENSION, BENIGN 02/11/2008  . CORONARY ATHEROSCLEROSIS NATIVE CORONARY ARTERY 02/11/2008  . ATRIAL FIBRILLATION 02/11/2008   Past Medical History:  Diagnosis Date  . Atrial fibrillation ([place])    a. Persistent, s/p afib ablation by JA 08/16/10;  b. s/p failed DCCV 05/2011; c. Chronic Tikosyn & Pradaxa (CHA2DS2VASc = 3-4).  [name] CAD (coronary artery disease)    a. 07/2002 Inf MI s/p BMS -->RCA, otw nonobs dzs;  b. 12/2011 Neg MV.  . Chronic diastolic CHF (congestive heart failure) ([place])   . Diabetes ([place])   . HTN (hypertension)   . Hyperlipidemia   . PVC's (premature ventricular contractions)   . Sleep apnea    followed by Dr Gwenette Greet   Past Surgical History:  Procedure Laterality Date  . atrial ablatio  08/16/10   afib ablation by JA  . CARDIAC CATHETERIZATION  2004    Successful PTCA and stent placement in the mid right   coronary artery with an extra 99% narrowing to 0% with placement of a 3.5 x  20 mm Express 2 stent with improvement of TIMI grade 1 flow to TIMI grade 3.  . CARDIOVERSION  06/15/2011   Procedure: CARDIOVERSION;  Surgeon: Fay Records, MD;  Location: [place];  Service: Cardiovascular;  Laterality: N/A;  . tonsilectomy     No Known Allergies Prior to Admission medications   Medication Sig Start Date End Date Taking? Authorizing Provider  dofetilide (TIKOSYN) 500 MCG capsule TAKE ONE CAPSULE TWICE A DAY 03/28/16   Lelon Perla, MD  lisinopril (PRINIVIL,ZESTRIL) 20 MG tablet TAKE 1 TABLET (20 MG TOTAL) BY MOUTH EVERY EVENING. 05/28/16   Lelon Perla, MD  Magnesium 200 MG TABS Take 1 tablet by mouth daily.    Historical Provider, MD  metFORMIN (GLUCOPHAGE) 850 MG tablet Take 1 tablet (850 mg total) by mouth 2 (two) times daily with a meal.  11/17/15   [name], MD  metoprolol succinate (TOPROL-XL) 25 MG 24 hr tablet TAKE 1 TABLET (25 MG TOTAL) BY MOUTH DAILY. 01/20/16   Lelon Perla, MD  Multiple Vitamins-Minerals (CENTRUM PO) Take 1 tablet by mouth daily.     Historical Provider, MD  nitroGLYCERIN (NITROSTAT) 0.4 MG SL tablet Place 1 tablet (0.4 mg total) under the tongue every 5 (five) minutes as needed (if 3rd dose needed call 911 or go to emergency room.). For chest pain. 03/07/15   [name], MD  Omega-3 Fatty Acids (FISH OIL PO) Take by mouth daily.    Historical Provider, MD  PRADAXA 150 MG CAPS capsule TAKE 1 CAPSULE (150 MG TOTAL) BY MOUTH 2 (TWO) TIMES DAILY. 11/18/15   Lelon Perla, MD  rosuvastatin (CRESTOR) 40 MG tablet Take 1 tablet (40 mg total) by mouth daily. 11/17/15   [name], MD   Social History   Social History  . Marital status: Single    Spouse name: N/A  . Number of children: 0  . Years of education: N/A   Occupational History  . Teacher, English as a foreign language [employer]   Social History Main Topics  . Smoking status: Never Smoker  . Smokeless tobacco: Never Used  . Alcohol use 0.6 oz/week    1 Cans of beer per week     Comment:  three times a week  . Drug use: No  . Sexual activity: Yes   Other Topics Concern  . Not on file   Social History Narrative   Pt lives in [place] alone.  Single.   He is a Teacher, English as a foreign language for [employer]   Review of Systems  Constitutional: Negative for fatigue and unexpected weight change.  Eyes: Negative for visual disturbance.  Respiratory: Negative for cough, chest tightness and shortness of breath.   Cardiovascular: Negative for chest pain, palpitations and leg swelling.  Gastrointestinal: Negative for abdominal pain and blood in stool.  Neurological: Negative for dizziness, light-headedness and headaches.   Objective:  Physical Exam  Constitutional: He is oriented to person, place, and time. He appears well-developed  and well-nourished.  HENT:  Head: Normocephalic and atraumatic.  Eyes: EOM are normal. Pupils are equal, round, and reactive to light.  Neck: No JVD present. Carotid bruit is not present.  Cardiovascular: Normal rate, regular rhythm and normal heart sounds.  Exam reveals no gallop and no friction rub.   No murmur heard. Pulmonary/Chest: Effort normal and breath sounds normal. No respiratory distress. He has no wheezes. He has no rales.  Musculoskeletal: He exhibits no edema.  Neurological: He is alert and oriented to person, place, and time.  Skin: Skin is warm and dry.  Psychiatric: He has a normal mood and affect.  Vitals reviewed.  Vitals:   06/25/16 0809  BP: 134/75  Pulse: 83  Resp: 16  Temp: 98 F (36.7 C)  TempSrc: Oral  SpO2: 97%  Weight: 205 lb 6.4 oz (93.2 kg)  Height: 5\' 11"  (1.803 m)   Body mass index is 28.65 kg/m. Assessment & Plan:   Scott White is a 56 y.o. male Atrial fibrillation, unspecified type ([place])  - Followed by cardiology, on rate control as well as anticoagulant. Recent PVCs, now stable. Taking magnesium.  Hyperlipidemia, unspecified hyperlipidemia type - Plan: Comprehensive metabolic panel, Lipid panel, rosuvastatin (CRESTOR) 40 MG tablet  - Elevated previously, will continue Crestor 40 mg daily. Check labs.  Type 2 diabetes mellitus without complication, without long-term current use of insulin ([place]) - Plan: HM Diabetes Foot Exam, Hemoglobin A1c, Microalbumin, urine, metFORMIN (GLUCOPHAGE) 850 MG tablet  - prior controlled. Continue same dose of metformin, check A1c, check urine microalbumin.  Essential hypertension - Plan: Comprehensive metabolic panel   - Borderline, but overall controlled. No change in meds. Recheck in 6 months.  Meds ordered this encounter  Medications  . rosuvastatin (CRESTOR) 40 MG tablet    Sig: Take 1 tablet (40 mg total) by mouth daily.    Dispense:  90 tablet    Refill:  1  . metFORMIN (GLUCOPHAGE) 850 MG  tablet    Sig: Take 1 tablet (850 mg total) by mouth 2 (two) times daily with a meal.    Dispense:  180 tablet    Refill:  1   Patient Instructions       IF you received an x-ray today, you will receive an invoice from [facility]. Please contact [facility] at [phone] with questions or concerns regarding your invoice.   IF you received labwork today, you will receive an invoice from [place]. Please contact [facility] at [phone] with questions or concerns regarding your invoice.   Our billing staff will not be able to assist you with questions regarding bills from these companies.  You will be contacted with the lab results as soon as they are available. The fastest way to get your results is to activate your My Chart account. Instructions are located on the last page of this paperwork. If you have not heard from [place] regarding the results in 2 weeks, please contact this office.      I personally performed the services described in this documentation, which was scribed in my presence. The recorded information has been reviewed and considered for accuracy and completeness, addended by me as needed, and agree with information above.  Signed,   Merri Ray, MD Primary Care at [place].  06/25/16 8:32 AM

## 2016-06-26 LAB — COMPREHENSIVE METABOLIC PANEL
ALT: 25 IU/L (ref 0–44)
AST: 27 IU/L (ref 0–40)
Albumin/Globulin Ratio: 1.9 (ref 1.2–2.2)
Albumin: 4.5 g/dL (ref 3.5–5.5)
Alkaline Phosphatase: 48 IU/L (ref 39–117)
BUN/Creatinine Ratio: 11 (ref 9–20)
BUN: 11 mg/dL (ref 6–24)
Bilirubin Total: 0.4 mg/dL (ref 0.0–1.2)
CALCIUM: 9.3 mg/dL (ref 8.7–10.2)
CO2: 23 mmol/L (ref 18–29)
CREATININE: 0.97 mg/dL (ref 0.76–1.27)
Chloride: 99 mmol/L (ref 96–106)
GFR calc Af Amer: 101 mL/min/{1.73_m2} (ref >59)
GFR, EST NON AFRICAN AMERICAN: 88 mL/min/{1.73_m2} (ref >59)
GLOBULIN, TOTAL: 2.4 g/dL (ref 1.5–4.5)
Glucose: 139 mg/dL — ABNORMAL HIGH (ref 65–99)
POTASSIUM: 5.2 mmol/L (ref 3.5–5.2)
SODIUM: 139 mmol/L (ref 134–144)
Total Protein: 6.9 g/dL (ref 6.0–8.5)

## 2016-06-26 LAB — LIPID PANEL
CHOLESTEROL TOTAL: 162 mg/dL (ref 100–199)
Chol/HDL Ratio: 4.3 ratio (ref 0.0–5.0)
HDL: 38 mg/dL — ABNORMAL LOW (ref >39)
LDL Calculated: 84 mg/dL (ref 0–99)
TRIGLYCERIDES: 200 mg/dL — AB (ref 0–149)
VLDL Cholesterol Cal: 40 mg/dL (ref 5–40)

## 2016-06-26 LAB — HEMOGLOBIN A1C
ESTIMATED AVERAGE GLUCOSE: 151 mg/dL
Hgb A1c MFr Bld: 6.9 % — ABNORMAL HIGH (ref 4.8–5.6)

## 2016-06-26 LAB — MICROALBUMIN, URINE: Microalbumin, Urine: <3 ug/mL

## 2016-07-16 DIAGNOSIS — G4733 Obstructive sleep apnea (adult) (pediatric): Secondary | ICD-10-CM | POA: Diagnosis not present

## 2016-08-06 ENCOUNTER — Encounter: Payer: Self-pay | Admitting: Nurse Practitioner

## 2016-08-08 ENCOUNTER — Other Ambulatory Visit: Payer: Self-pay | Admitting: *Deleted

## 2016-08-08 ENCOUNTER — Other Ambulatory Visit: Payer: Self-pay | Admitting: Family Medicine

## 2016-08-08 DIAGNOSIS — I2510 Atherosclerotic heart disease of native coronary artery without angina pectoris: Secondary | ICD-10-CM

## 2016-08-08 MED ORDER — SILDENAFIL CITRATE 50 MG PO TABS: 50.0000 mg | ORAL_TABLET | Freq: Every day | ORAL | 0 refills | Status: DC | PRN

## 2016-08-09 MED ORDER — NITROGLYCERIN 0.4 MG SL SUBL: 0.4000 mg | SUBLINGUAL_TABLET | SUBLINGUAL | 0 refills | Status: DC | PRN

## 2016-08-09 NOTE — Telephone Encounter (Signed)
02/2015 last refill 06/2016 last visit

## 2016-08-15 DIAGNOSIS — G4733 Obstructive sleep apnea (adult) (pediatric): Secondary | ICD-10-CM | POA: Diagnosis not present

## 2016-08-22 DIAGNOSIS — D1801 Hemangioma of skin and subcutaneous tissue: Secondary | ICD-10-CM | POA: Diagnosis not present

## 2016-08-22 DIAGNOSIS — L814 Other melanin hyperpigmentation: Secondary | ICD-10-CM | POA: Diagnosis not present

## 2016-08-22 DIAGNOSIS — L821 Other seborrheic keratosis: Secondary | ICD-10-CM | POA: Diagnosis not present

## 2016-08-22 DIAGNOSIS — D225 Melanocytic nevi of trunk: Secondary | ICD-10-CM | POA: Diagnosis not present

## 2016-09-14 DIAGNOSIS — G4733 Obstructive sleep apnea (adult) (pediatric): Secondary | ICD-10-CM | POA: Diagnosis not present

## 2016-09-17 LAB — HM DIABETES EYE EXAM

## 2016-09-21 ENCOUNTER — Other Ambulatory Visit: Payer: Self-pay | Admitting: Cardiology

## 2016-10-16 DIAGNOSIS — G4733 Obstructive sleep apnea (adult) (pediatric): Secondary | ICD-10-CM | POA: Diagnosis not present

## 2016-11-13 ENCOUNTER — Other Ambulatory Visit: Payer: Self-pay | Admitting: Cardiology

## 2016-11-15 DIAGNOSIS — G4733 Obstructive sleep apnea (adult) (pediatric): Secondary | ICD-10-CM | POA: Diagnosis not present

## 2016-12-06 ENCOUNTER — Other Ambulatory Visit: Payer: Self-pay | Admitting: Cardiology

## 2016-12-15 DIAGNOSIS — G4733 Obstructive sleep apnea (adult) (pediatric): Secondary | ICD-10-CM | POA: Diagnosis not present

## 2017-01-14 DIAGNOSIS — G4733 Obstructive sleep apnea (adult) (pediatric): Secondary | ICD-10-CM | POA: Diagnosis not present

## 2017-02-13 DIAGNOSIS — G4733 Obstructive sleep apnea (adult) (pediatric): Secondary | ICD-10-CM | POA: Diagnosis not present

## 2017-02-14 ENCOUNTER — Other Ambulatory Visit: Payer: Self-pay | Admitting: Family Medicine

## 2017-02-14 DIAGNOSIS — E119 Type 2 diabetes mellitus without complications: Secondary | ICD-10-CM

## 2017-03-04 ENCOUNTER — Other Ambulatory Visit: Payer: Self-pay | Admitting: Cardiology

## 2017-03-05 NOTE — Telephone Encounter (Signed)
Please arrange fuov with PA or me and can fill viagra then if symptoms stable. Scott White

## 2017-03-05 NOTE — Telephone Encounter (Signed)
Left message for pt to call to schedule follow up appointment.

## 2017-03-07 ENCOUNTER — Ambulatory Visit: Payer: BLUE CROSS/BLUE SHIELD | Admitting: Physician Assistant

## 2017-03-07 ENCOUNTER — Telehealth: Payer: Self-pay | Admitting: Physician Assistant

## 2017-03-07 ENCOUNTER — Encounter: Payer: Self-pay | Admitting: Physician Assistant

## 2017-03-07 VITALS — BP 142/84 | HR 74 | Ht 71.5 in | Wt 204.2 lb

## 2017-03-07 DIAGNOSIS — E785 Hyperlipidemia, unspecified: Secondary | ICD-10-CM

## 2017-03-07 DIAGNOSIS — I10 Essential (primary) hypertension: Secondary | ICD-10-CM

## 2017-03-07 DIAGNOSIS — I480 Paroxysmal atrial fibrillation: Secondary | ICD-10-CM

## 2017-03-07 DIAGNOSIS — I2510 Atherosclerotic heart disease of native coronary artery without angina pectoris: Secondary | ICD-10-CM

## 2017-03-07 DIAGNOSIS — I5032 Chronic diastolic (congestive) heart failure: Secondary | ICD-10-CM | POA: Diagnosis not present

## 2017-03-07 DIAGNOSIS — E119 Type 2 diabetes mellitus without complications: Secondary | ICD-10-CM

## 2017-03-07 DIAGNOSIS — Z9989 Dependence on other enabling machines and devices: Secondary | ICD-10-CM

## 2017-03-07 DIAGNOSIS — G4733 Obstructive sleep apnea (adult) (pediatric): Secondary | ICD-10-CM | POA: Diagnosis not present

## 2017-03-07 MED ORDER — TADALAFIL 10 MG PO TABS: 10.0000 mg | ORAL_TABLET | Freq: Every day | ORAL | 0 refills | Status: DC | PRN

## 2017-03-07 NOTE — Telephone Encounter (Signed)
Left message for pharmacy, interaction discussed with the patient at office visit today.

## 2017-03-07 NOTE — Telephone Encounter (Signed)
Lease call concerning interaction between Nitroglycerin and Cialis.

## 2017-03-07 NOTE — Patient Instructions (Signed)
Medication Instructions:   TADALAFIL SENT TO THE PHARMACY  Follow-Up:  Your physician wants you to follow-up in: [place] will receive a reminder letter in the mail two months in advance. If you don't receive a letter, please call our office to schedule the follow-up appointment.   If you need a refill on your cardiac medications before your next appointment, please call your pharmacy.

## 2017-03-07 NOTE — Progress Notes (Signed)
Cardiology Office Note    Date:  03/09/2017   ID:  Scott White, DOB Jan 18, 1961, MRN [id number]  PCP:  [name], MD  Cardiologist:  Dr. Stanford Breed  Primary electrophysiologist: Dr. Rayann Heman  Chief Complaint  Patient presents with  . Follow-up    seen for Dr. Stanford Breed. Annual visit    History of Present Illness:  Scott White is a 56 y.o. male with PMH of atrial fibrillation s/p ablation by Dr. Rayann Heman in 07/2010, CAD s/p BMS to RCA 07/2002, HTN, HLD, DM, chronic diastolic HF, OSA and PVCs.  He had a negative Myoview in October 2013.  Due to recurrence of atrial fibrillation, he is now on Pradaxa and Tikosyn.  Patient presents today for annual visit.  He only have palpitation whenever he is not wearing the CPAP machine.  Otherwise he is doing very good.  The total duration of palpitation only last a few minutes at a time.  It is quite rare.  He did not have any PVC on today's EKGG.  His QTC is within normal limits.  We will continue on Pradaxa and Tikosyn.  If his insurance have any issue with Pradaxa, then we will consider Xarelto or Eliquis.  Otherwise he denies any exertional chest pain or shortness of breath.  He has no lower extremity edema, orthopnea or PND.  He can follow-up with Dr. Stanford Breed in 1 year.   Past Medical History:  Diagnosis Date  . Atrial fibrillation ([place])    a. Persistent, s/p afib ablation by JA 08/16/10;  b. s/p failed DCCV 05/2011; c. Chronic Tikosyn & Pradaxa (CHA2DS2VASc = 3-4).  [name] CAD (coronary artery disease)    a. 07/2002 Inf MI s/p BMS -->RCA, otw nonobs dzs;  b. 12/2011 Neg MV.  . Chronic diastolic CHF (congestive heart failure) ([place])   . Diabetes ([place])   . HTN (hypertension)   . Hyperlipidemia   . PVC's (premature ventricular contractions)   . Sleep apnea    followed by Dr Gwenette Greet    Past Surgical History:  Procedure Laterality Date  . atrial ablatio  08/16/10   afib ablation by [facility]  . CARDIAC CATHETERIZATION  2004    Successful PTCA  and stent placement in the mid right   coronary artery with an extra 99% narrowing to 0% with placement of a 3.5 x  20 mm Express 2 stent with improvement of TIMI grade 1 flow to TIMI grade 3.  . CARDIOVERSION  06/15/2011   Procedure: CARDIOVERSION;  Surgeon: Fay Records, MD;  Location: [facility] OR;  Service: Cardiovascular;  Laterality: N/A;  . tonsilectomy      Current Medications: Outpatient Medications Prior to Visit  Medication Sig Dispense Refill  . dofetilide (TIKOSYN) 500 MCG capsule TAKE ONE CAPSULE TWICE A DAY 180 capsule 2  . lisinopril (PRINIVIL,ZESTRIL) 20 MG tablet TAKE 1 TABLET (20 MG TOTAL) BY MOUTH EVERY EVENING. 30 tablet 11  . Magnesium 200 MG TABS Take 1 tablet by mouth daily.    . metFORMIN (GLUCOPHAGE) 850 MG tablet TAKE 1 TABLET (850 MG TOTAL) BY MOUTH 2 (TWO) TIMES DAILY WITH A MEAL. 180 tablet 1  . metoprolol succinate (TOPROL-XL) 25 MG 24 hr tablet TAKE 1 TABLET (25 MG TOTAL) BY MOUTH DAILY. 30 tablet 8  . Multiple Vitamins-Minerals (CENTRUM PO) Take 1 tablet by mouth daily.     . nitroGLYCERIN (NITROSTAT) 0.4 MG SL tablet Place 1 tablet (0.4 mg total) under the tongue every 5 (five) minutes as  needed. For chest pain at 3rd dose call 911 25 tablet 0  . Omega-3 Fatty Acids (FISH OIL PO) Take by mouth daily.    [name] PRADAXA 150 MG CAPS capsule TAKE ONE CAPSULE BY MOUTH TWICE A DAY 180 capsule 1  . rosuvastatin (CRESTOR) 40 MG tablet Take 1 tablet (40 mg total) by mouth daily. 90 tablet 1  . sildenafil (VIAGRA) 50 MG tablet Take 1 tablet (50 mg total) by mouth daily as needed for erectile dysfunction. 10 tablet 0   No facility-administered medications prior to visit.      Allergies:   Patient has no known allergies.   Social History   Socioeconomic History  . Marital status: Single    Spouse name: None  . Number of children: 0  . Years of education: None  . Highest education level: None  Social Needs  . Financial resource strain: None  . Food insecurity - worry: None   . Food insecurity - inability: None  . Transportation needs - medical: None  . Transportation needs - non-medical: None  Occupational History  . 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 Smoking status: Never Smoker  . Smokeless tobacco: Never Used  Substance and Sexual Activity  . Alcohol use: Yes    Alcohol/week: 0.6 oz    Types: 1 Cans of beer per week    Comment:  three times a week  . Drug use: No  . Sexual activity: Yes  Other Topics Concern  . None  Social History Narrative   Pt lives in [place] alone.  Single.   He is a Teacher, English as a foreign language for [employer]     Family History:  The patient's family history includes Alzheimer's disease in his brother and father; Coronary artery disease in his mother; Diabetes in his brother and father; Heart disease in his brother and mother; Heart failure in his mother; Hyperlipidemia in his brother, brother, and mother; Hypertension in his brother and mother.   ROS:   Please see the history of present illness.    ROS All other systems reviewed and are negative.   PHYSICAL EXAM:   VS:  BP (!) 142/84   Pulse 74   Ht 5' 11.5" (1.816 m)   Wt 204 lb 4 oz (92.6 kg)   BMI 28.09 kg/m    GEN: Well nourished, well developed, in no acute distress  HEENT: normal  Neck: no JVD, carotid bruits, or masses Cardiac: RRR; no murmurs, rubs, or gallops,no edema  Respiratory:  clear to auscultation bilaterally, normal work of breathing GI: soft, nontender, nondistended, + BS MS: no deformity or atrophy  Skin: warm and dry, no rash Neuro:  Alert and Oriented x 3, Strength and sensation are intact Psych: euthymic mood, full affect  Wt Readings from Last 3 Encounters:  03/07/17 204 lb 4 oz (92.6 kg)  06/25/16 205 lb 6.4 oz (93.2 kg)  05/15/16 205 lb (93 kg)      Studies/Labs Reviewed:   EKG:  EKG is ordered today.  The ekg ordered today demonstrates normal sinus rhythm, no significant ST-T wave  changes  Recent Labs: 05/15/2016: Magnesium 2.0 06/25/2016: ALT 25; BUN 11; Creatinine, Ser 0.97; Potassium 5.2; Sodium 139   Lipid Panel    Component Value Date/Time   CHOL 162 06/25/2016 0832   TRIG 200 (H) 06/25/2016 0832   HDL 38 (L) 06/25/2016 0832   CHOLHDL 4.3 06/25/2016 0832   CHOLHDL 6.2 (H)  11/17/2015 0828   VLDL 61 (H) 11/17/2015 0828   LDLCALC 84 06/25/2016 0832   LDLDIRECT 92.8 11/04/2012 1010    Additional studies/ records that were reviewed today include:   Echo 06/18/2011 LV EF: 60%  Study Conclusions  - Left ventricle: The cavity size was normal. Wall thickness was normal. The estimated ejection fraction was 60%. Wall motion was normal; there were no regional wall motion abnormalities. - Aortic valve: Sclerosis without stenosis.   ASSESSMENT:    1. Paroxysmal atrial fibrillation (HCC)   2. Coronary artery disease involving native coronary artery of native heart without angina pectoris   3. Hypertension, essential   4. Hyperlipidemia LDL goal <70   5. Controlled type 2 diabetes mellitus without complication, without long-term current use of insulin ([place])   6. Chronic diastolic heart failure ([place])   7. OSA on CPAP      PLAN:  In order of problems listed above:  1. PAF: Well controlled on Tikosyn and Pradaxa.  Although he occasionally notices palpitation, it only occurs when he is not using CPAP.  EKG today shows normal sinus rhythm.  2. CAD: Denies any chest pain, remote stenting of RCA  3. Hypertension: Blood pressure normally quite well controlled, mildly elevated today.  Will hold off on adjusting blood pressure medication.  4. Hyperlipidemia: Continue Crestor 40 mg daily.  5. DM 2: Continue on metformin, managed by primary care provider  6. Chronic diastolic heart failure: Euvolemic on physical exam  7. Obstructive sleep apnea on CPAP: Compliant.  8. Erectile dysfunction: He asked for short supply of Cialis.  He is aware that he should  not be taking nitroglycerin within 48 hours of taking Cialis    Medication Adjustments/Labs and Tests Ordered: Current medicines are reviewed at length with the patient today.  Concerns regarding medicines are outlined above.  Medication changes, Labs and Tests ordered today are listed in the Patient Instructions below. Patient Instructions  Medication Instructions:   TADALAFIL SENT TO THE PHARMACY  Follow-Up:  Your physician wants you to follow-up in: [place] will receive a reminder letter in the mail two months in advance. If you don't receive a letter, please call our office to schedule the follow-up appointment.   If you need a refill on your cardiac medications before your next appointment, please call your pharmacy.       Hilbert Corrigan, [place]  03/09/2017 10:11 AM    [address], [place], [place]  [address] Phone: [phone]; Fax: [phone]

## 2017-03-09 ENCOUNTER — Encounter: Payer: Self-pay | Admitting: Physician Assistant

## 2017-03-15 DIAGNOSIS — G4733 Obstructive sleep apnea (adult) (pediatric): Secondary | ICD-10-CM | POA: Diagnosis not present

## 2017-03-18 ENCOUNTER — Other Ambulatory Visit: Payer: Self-pay | Admitting: *Deleted

## 2017-03-18 MED ORDER — DOFETILIDE 500 MCG PO CAPS: 500.0000 ug | ORAL_CAPSULE | Freq: Two times a day (BID) | ORAL | 3 refills | Status: DC

## 2017-03-18 MED ORDER — METOPROLOL SUCCINATE ER 25 MG PO TB24: 25.0000 mg | ORAL_TABLET | Freq: Every day | ORAL | 3 refills | Status: DC

## 2017-03-25 ENCOUNTER — Ambulatory Visit (INDEPENDENT_AMBULATORY_CARE_PROVIDER_SITE_OTHER): Payer: BLUE CROSS/BLUE SHIELD | Admitting: Family Medicine

## 2017-03-25 ENCOUNTER — Other Ambulatory Visit: Payer: Self-pay | Admitting: Family Medicine

## 2017-03-25 ENCOUNTER — Encounter: Payer: Self-pay | Admitting: Family Medicine

## 2017-03-25 ENCOUNTER — Other Ambulatory Visit: Payer: Self-pay

## 2017-03-25 VITALS — BP 118/70 | HR 71 | Temp 97.9°F | Resp 18 | Ht 71.5 in | Wt 202.2 lb

## 2017-03-25 DIAGNOSIS — H9311 Tinnitus, right ear: Secondary | ICD-10-CM | POA: Diagnosis not present

## 2017-03-25 DIAGNOSIS — Z114 Encounter for screening for human immunodeficiency virus [HIV]: Secondary | ICD-10-CM | POA: Diagnosis not present

## 2017-03-25 DIAGNOSIS — E785 Hyperlipidemia, unspecified: Secondary | ICD-10-CM

## 2017-03-25 DIAGNOSIS — Z125 Encounter for screening for malignant neoplasm of prostate: Secondary | ICD-10-CM

## 2017-03-25 DIAGNOSIS — E119 Type 2 diabetes mellitus without complications: Secondary | ICD-10-CM

## 2017-03-25 DIAGNOSIS — Z0000 Encounter for general adult medical examination without abnormal findings: Secondary | ICD-10-CM

## 2017-03-25 DIAGNOSIS — Z1159 Encounter for screening for other viral diseases: Secondary | ICD-10-CM

## 2017-03-25 DIAGNOSIS — Z23 Encounter for immunization: Secondary | ICD-10-CM | POA: Diagnosis not present

## 2017-03-25 MED ORDER — METFORMIN HCL 850 MG PO TABS: 850.0000 mg | ORAL_TABLET | Freq: Two times a day (BID) | ORAL | 1 refills | Status: DC

## 2017-03-25 MED ORDER — ROSUVASTATIN CALCIUM 40 MG PO TABS: 40.0000 mg | ORAL_TABLET | Freq: Every day | ORAL | 1 refills | Status: DC

## 2017-03-25 NOTE — Progress Notes (Signed)
Subjective:  This chart was scribed for [name], MD by Tamsen Roers, at [place] at [facility].  This patient was seen in room 10 and the patient's care was started at 10:14 AM.   Chief Complaint  Patient presents with  . Annual Exam    also get his A1c checked      Patient ID: Scott White, male    DOB: Apr 22, 1960, 57 y.o.   MRN: [id number]  HPI HPI Comments: Scott White is a 57 y.o. male who presents to Primary Care at [facility] for an annual physical exam.   He has a history of hypertension, hyperlipidemia, atrial fibrillation and diabetes.  Diabetes: He is on metformin 50 mg BID. He is also on a statin and ACE inhibitor. --- Patient is compliant with his metformin. States that he has been having extra carbohydrates in his diet recently.  Eye exam: 09/17/16 -- He is compliant with seeing his eye doctor.  Foot exam 06/25/16  He is due for pneumovax.  Urine micro albumin 06/25/16.  Hypertension: Patient is on Lisinopril 20 mg QD. -- Patient is compliant with this medication and denies any side effects.  Lab Results  Component Value Date   CREATININE 0.97 06/25/2016    Hyperlipidemia : He is on Crestor 40 mg QD. --- He is compliant with this medication and denies any side effects.  Lab Results  Component Value Date   CHOL 162 06/25/2016   HDL 38 (L) 06/25/2016   LDLCALC 84 06/25/2016   LDLDIRECT 92.8 11/04/2012   TRIG 200 (H) 06/25/2016   CHOLHDL 4.3 06/25/2016   Lab Results  Component Value Date   ALT 25 06/25/2016   AST 27 06/25/2016   ALKPHOS 48 06/25/2016   BILITOT 0.4 06/25/2016     Atrial Fibrillation: Cardiologist Dr. Stanford Breed. Also with history of PVC's.  He is on Tikosyn, Toprol for rate control, Pradaxa for anticoagulation and over the counter magnesium. Reported chronic diastolic heart failure, euvolemic at cardiology visit 02/2017.  No changes to other medications.--- He is compliant with these medications and denies any side effects.      OSA on CPAP: Patient is compliant with his CPAP.    Cancer screening:  Colon cancer screening: colonoscopy: 05/15/13-- Recheck every 10 years.  Prostate cancer screening: Denies any family history of prostate cancer. He would like to be screened for this today.  Lab Results  Component Value Date   PSA 0.73 03/16/2013    Depression:  Depression screen [facility] 2/9 03/25/2017 11/17/2015 06/15/2015 03/07/2015 10/25/2014  Decreased Interest 0 0 0 0 0  Down, Depressed, Hopeless 0 0 0 0 0  PHQ - 2 Score 0 0 0 0 0    Immunizations: Immunization History  Administered Date(s) Administered  . Influenza-Unspecified 11/17/2012  . Tdap 11/16/2013  Due for pneumovax: He would like this vaccination today.    Vision:   Visual Acuity Screening   Right eye Left eye Both eyes  Without correction:     With correction: 20/15 20/15 20/15     Dentist:Patient has a dentist.    Exercise: Patient goes to the gym about three times this past week (1.5 hours when he goes).  He states that he did "slack" a bit in the month of December.  Denies any chest pains or difficulty breathing on exertion.    Other screening: HIV, hep C   Tinnitus: Patient states that he got a perforated ear drum in the mid 90's and has been  having some ringing ever since.    Patient Active Problem List   Diagnosis Date Noted  . Complex sleep apnea syndrome 09/22/2010  . Long term (current) use of anticoagulants 07/03/2010  . Long term current use of anticoagulant 05/31/2010  . DYSLIPIDEMIA 09/27/2008  . HYPERTRIGLYCERIDEMIA 02/11/2008  . ESSENTIAL HYPERTENSION, BENIGN 02/11/2008  . CORONARY ATHEROSCLEROSIS NATIVE CORONARY ARTERY 02/11/2008  . ATRIAL FIBRILLATION 02/11/2008   Past Medical History:  Diagnosis Date  . Atrial fibrillation ([place])    a. Persistent, s/p afib ablation by [facility] 08/16/10;  b. s/p failed DCCV 05/2011; c. Chronic Tikosyn & Pradaxa (CHA2DS2VASc = 3-4).  [name] CAD (coronary artery disease)    a. 07/2002 Inf  MI s/p BMS -->RCA, otw nonobs dzs;  b. 12/2011 Neg MV.  . Chronic diastolic CHF (congestive heart failure) ([place])   . Diabetes ([place])   . HTN (hypertension)   . Hyperlipidemia   . PVC's (premature ventricular contractions)   . Sleep apnea    followed by Dr Gwenette Greet   Past Surgical History:  Procedure Laterality Date  . atrial ablatio  08/16/10   afib ablation by [facility]  . CARDIAC CATHETERIZATION  2004    Successful PTCA and stent placement in the mid right   coronary artery with an extra 99% narrowing to 0% with placement of a 3.5 x  20 mm Express 2 stent with improvement of TIMI grade 1 flow to TIMI grade 3.  . CARDIOVERSION  06/15/2011   Procedure: CARDIOVERSION;  Surgeon: Fay Records, MD;  Location: [place];  Service: Cardiovascular;  Laterality: N/A;  . tonsilectomy     No Known Allergies Prior to Admission medications   Medication Sig Start Date End Date Taking? Authorizing Provider  dofetilide (TIKOSYN) 500 MCG capsule Take 1 capsule (500 mcg total) by mouth 2 (two) times daily. 03/18/17  Yes Lelon Perla, MD  lisinopril (PRINIVIL,ZESTRIL) 20 MG tablet TAKE 1 TABLET (20 MG TOTAL) BY MOUTH EVERY EVENING. 05/28/16  Yes Crenshaw, Denice Bors, MD  Magnesium 200 MG TABS Take 1 tablet by mouth daily.   Yes [provider]  metFORMIN (GLUCOPHAGE) 850 MG tablet TAKE 1 TABLET (850 MG TOTAL) BY MOUTH 2 (TWO) TIMES DAILY WITH A MEAL. 02/14/17  Yes [name], MD  metoprolol succinate (TOPROL-XL) 25 MG 24 hr tablet Take 1 tablet (25 mg total) by mouth daily. 03/18/17  Yes Lelon Perla, MD  Multiple Vitamins-Minerals (CENTRUM PO) Take 1 tablet by mouth daily.    Yes [provider]  nitroGLYCERIN (NITROSTAT) 0.4 MG SL tablet Place 1 tablet (0.4 mg total) under the tongue every 5 (five) minutes as needed. For chest pain at 3rd dose call 911 08/09/16  Yes [name], MD  Omega-3 Fatty Acids (FISH OIL PO) Take by mouth daily.   Yes [provider]  PRADAXA 150 MG  CAPS capsule TAKE ONE CAPSULE BY MOUTH TWICE A DAY 11/14/16  Yes Lelon Perla, MD  rosuvastatin (CRESTOR) 40 MG tablet Take 1 tablet (40 mg total) by mouth daily. 06/25/16  Yes [name], MD  tadalafil (CIALIS) 10 MG tablet Take 1 tablet (10 mg total) by mouth daily as needed for erectile dysfunction. 03/07/17  Yes Almyra Deforest, PA   Social History   Socioeconomic History  . Marital status: Single    Spouse name: Not on file  . Number of children: 0  . Years of education: Not on file  . Highest education level: Not on file  Social Needs  . Financial resource strain: Not on file  . Food insecurity - worry: Not on file  . Food insecurity - inability: Not on file  . Transportation needs - medical: Not on file  . Transportation needs - non-medical: Not on file  Occupational History  . 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 Smoking status: Never Smoker  . Smokeless tobacco: Never Used  Substance and Sexual Activity  . Alcohol use: Yes    Alcohol/week: 0.6 oz    Types: 1 Cans of beer per week    Comment:  three times a week  . Drug use: No  . Sexual activity: Yes  Other Topics Concern  . Not on file  Social History Narrative   Pt lives in [place] alone.  Single.   He is a Teacher, English as a foreign language for [employer]: Positive for tinnitus.   All other systems reviewed and are negative.      Objective:   Physical Exam  Constitutional: He is oriented to person, place, and time. He appears well-developed and well-nourished.  HENT:  Head: Normocephalic and atraumatic.  Right Ear: External ear normal.  Left Ear: External ear normal.  Mouth/Throat: Oropharynx is clear and moist.  Eyes: Conjunctivae and EOM are normal. Pupils are equal, round, and reactive to light.  Neck: Normal range of motion. Neck supple. No thyromegaly present.  Cardiovascular: Normal rate, regular rhythm, normal heart sounds and  intact distal pulses.  Pulmonary/Chest: Effort normal and breath sounds normal. No respiratory distress. He has no wheezes.  Abdominal: Soft. He exhibits no distension. There is no tenderness. Hernia confirmed negative in the right inguinal area and confirmed negative in the left inguinal area.  Genitourinary: Prostate normal.  Musculoskeletal: Normal range of motion. He exhibits no edema or tenderness.  Lymphadenopathy:    He has no cervical adenopathy.  Neurological: He is alert and oriented to person, place, and time. He has normal reflexes.  Skin: Skin is warm and dry.  Psychiatric: He has a normal mood and affect. His behavior is normal.  Vitals reviewed.  Vitals:   03/25/17 0939  BP: 118/70  Pulse: 71  Resp: 18  Temp: 97.9 F (36.6 C)  TempSrc: Oral  SpO2: 99%  Weight: 202 lb 3.2 oz (91.7 kg)  Height: 5' 11.5" (1.816 m)       Assessment & Plan:   Scott White is a 57 y.o. male Annual physical exam  - -anticipatory guidance as below in AVS, screening labs above. Health maintenance items as above in HPI discussed/recommended as applicable.   Need for pneumococcal vaccination - Plan: Pneumococcal polysaccharide vaccine 23-valent greater than or equal to 2yo subcutaneous/IM  - Given with history of diabetes  Screening for HIV (human immunodeficiency virus) - Plan: HIV antibody  Need for hepatitis C screening test - Plan: Hepatitis C antibody  Type 2 diabetes mellitus without complication, without long-term current use of insulin ([place]) - Plan: Hemoglobin A1c, metFORMIN (GLUCOPHAGE) 850 MG tablet  -Check A1c, continue same dose of metformin for now.  Tinnitus of right ear  - Long-standing symptoms, option of ear nose and throat evaluation if changing. Handout given  Screening for prostate cancer - Plan: PSA We discussed pros and cons of prostate cancer screening, and after this discussion, he chose to have screening done. PSA obtained, and no concerning findings on  DRE.   Hyperlipidemia,  unspecified hyperlipidemia type - Plan: Comprehensive metabolic panel, Lipid panel, rosuvastatin (CRESTOR) 40 MG tablet  - Tolerating Crestor, continue same dose, labs pending  Meds ordered this encounter  Medications  . rosuvastatin (CRESTOR) 40 MG tablet    Sig: Take 1 tablet (40 mg total) by mouth daily.    Dispense:  90 tablet    Refill:  1  . metFORMIN (GLUCOPHAGE) 850 MG tablet    Sig: Take 1 tablet (850 mg total) by mouth 2 (two) times daily with a meal.    Dispense:  180 tablet    Refill:  1   Patient Instructions    Thanks for coming in today. I rechecked your A1c, cholesterol levels as well as electrolytes. No change in medications for now. Let me know if there are any questions. Depending on hemoglobin A1c results, follow-up in 3-6 months.  See information below on tinnitus. Background noises often helpful. If you have any acute change in hearing, I would like you to be evaluated by ear nose and throat. I can place a referral if needed.  Tinnitus Tinnitus refers to hearing a sound when there is no actual source for that sound. This is often described as ringing in the ears. However, people with this condition may hear a variety of noises. A person may hear the sound in one ear or in both ears. The sounds of tinnitus can be soft, loud, or somewhere in between. Tinnitus can last for a few seconds or can be constant for days. It may go away without treatment and come back at various times. When tinnitus is constant or happens often, it can lead to other problems, such as trouble sleeping and trouble concentrating. Almost everyone experiences tinnitus at some point. Tinnitus that is long-lasting (chronic) or comes back often is a problem that may require medical attention. What are the causes? The cause of tinnitus is often not known. In some cases, it can result from other problems or conditions, including:  Exposure to loud noises from machinery, music,  or other sources.  Hearing loss.  Ear or sinus infections.  Earwax buildup.  A foreign object in the ear.  Use of certain medicines.  Use of alcohol and caffeine.  High blood pressure.  Heart diseases.  Anemia.  Allergies.  Meniere disease.  Thyroid problems.  Tumors.  An enlarged part of a weakened blood vessel (aneurysm).  What are the signs or symptoms? The main symptom of tinnitus is hearing a sound when there is no source for that sound. It may sound like:  Buzzing.  Roaring.  Ringing.  Blowing air, similar to the sound heard when you listen to a seashell.  Hissing.  Whistling.  Sizzling.  Humming.  Running water.  A sustained musical note.  How is this diagnosed? Tinnitus is diagnosed based on your symptoms. Your health care provider will do a physical exam. A comprehensive hearing exam (audiologic exam) will be done if your tinnitus:  Affects only one ear (unilateral).  Causes hearing difficulties.  Lasts 6 months or longer.  You may also need to see a health care provider who specializes in hearing disorders (audiologist). You may be asked to complete a questionnaire to determine the severity of your tinnitus. Tests may be done to help determine the cause and to rule out other conditions. These can include:  Imaging studies of your head and brain, such as: ? A CT scan. ? An MRI.  An imaging study of your blood vessels (angiogram).  How is this treated? Treating an underlying medical condition can sometimes make tinnitus go away. If your tinnitus continues, other treatments may include:  Medicines, such as certain antidepressants or sleeping aids.  Sound generators to mask the tinnitus. These include: ? Tabletop sound machines that play relaxing sounds to help you fall asleep. ? Wearable devices that fit in your ear and play sounds or music. ? A small device that uses headphones to deliver a signal embedded in music (acoustic neural  stimulation). In time, this may change the pathways of your brain and make you less sensitive to tinnitus. This device is used for very severe cases when no other treatment is working.  Therapy and counseling to help you manage the stress of living with tinnitus.  Using hearing aids or cochlear implants, if your tinnitus is related to hearing loss.  Follow these instructions at home:  When possible, avoid being in loud places and being exposed to loud sounds.  Wear hearing protection, such as earplugs, when you are exposed to loud noises.  Do not take stimulants, such as nicotine, alcohol, or caffeine.  Practice techniques for reducing stress, such as meditation, yoga, or deep breathing.  Use a white noise machine, a humidifier, or other devices to mask the sound of tinnitus.  Sleep with your head slightly raised. This may reduce the impact of tinnitus.  Try to get plenty of rest each night. Contact a health care provider if:  You have tinnitus in just one ear.  Your tinnitus continues for 3 weeks or longer without stopping.  Home care measures are not helping.  You have tinnitus after a head injury.  You have tinnitus along with any of the following: ? Dizziness. ? Loss of balance. ? Nausea and vomiting. This information is not intended to replace advice given to you by your health care provider. Make sure you discuss any questions you have with your health care provider. Document Released: 03/05/2005 Document Revised: 11/06/2015 Document Reviewed: 08/05/2013 Elsevier Interactive Patient Education  2018 [place] you healthy  Get these tests  Blood pressure- Have your blood pressure checked once a year by your healthcare provider.  Normal blood pressure is 120/80  Weight- Have your body mass index (BMI) calculated to screen for obesity.  BMI is a measure of body fat based on height and weight. You can also calculate your own BMI at  ViewBank[employer].si.  Cholesterol- Have your cholesterol checked every year.  Diabetes- Have your blood sugar checked regularly if you have high blood pressure, high cholesterol, have a family history of diabetes or if you are overweight.  Screening for Colon Cancer- Colonoscopy starting at age 81.  Screening may begin sooner depending on your family history and other health conditions. Follow up colonoscopy as directed by your Gastroenterologist.  Screening for Prostate Cancer- Both blood work (PSA) and a rectal exam help screen for Prostate Cancer.  Screening begins at age 11 with African-American men and at age 64 with Caucasian men.  Screening may begin sooner depending on your family history.  Take these medicines  Aspirin- One aspirin daily can help prevent Heart disease and Stroke.  Flu shot- Every fall.  Tetanus- Every 10 years.  Zostavax- Once after the age of 71 to prevent Shingles.  Pneumonia shot- Once after the age of 59; if you are younger than 62, ask your healthcare provider if you need a Pneumonia shot.  Take these steps  Don't smoke- If you do smoke, talk to  your doctor about quitting.  For tips on how to quit, go to www.smokefree.gov or call 1-800-QUIT-NOW.  Be physically active- Exercise 5 days a week for at least 30 minutes.  If you are not already physically active start slow and gradually work up to 30 minutes of moderate physical activity.  Examples of moderate activity include walking briskly, mowing the yard, dancing, swimming, bicycling, etc.  Eat a healthy diet- Eat a variety of healthy food such as fruits, vegetables, low fat milk, low fat cheese, yogurt, lean meant, poultry, fish, beans, tofu, etc. For more information go to www.thenutritionsource.org  Drink alcohol in moderation- Limit alcohol intake to less than two drinks a day. Never drink and drive.  Dentist- Brush and floss twice daily; visit your dentist twice a year.  Depression- Your  emotional health is as important as your physical health. If you're feeling down, or losing interest in things you would normally enjoy please talk to your healthcare provider.  Eye exam- Visit your eye doctor every year.  Safe sex- If you may be exposed to a sexually transmitted infection, use a condom.  Seat belts- Seat belts can save your life; always wear one.  Smoke/Carbon Monoxide detectors- These detectors need to be installed on the appropriate level of your home.  Replace batteries at least once a year.  Skin cancer- When out in the sun, cover up and use sunscreen 15 SPF or higher.  Violence- If anyone is threatening you, please tell your healthcare provider.  Living Will/ Health care power of attorney- Speak with your healthcare provider and family.   IF you received an x-ray today, you will receive an invoice from [facility]. Please contact [facility] at [phone] with questions or concerns regarding your invoice.   IF you received labwork today, you will receive an invoice from [place]. Please contact [facility] at [phone] with questions or concerns regarding your invoice.   Our billing staff will not be able to assist you with questions regarding bills from these companies.  You will be contacted with the lab results as soon as they are available. The fastest way to get your results is to activate your My Chart account. Instructions are located on the last page of this paperwork. If you have not heard from [place] regarding the results in 2 weeks, please contact this office.      I personally performed the services described in this documentation, which was scribed in my presence. The recorded information has been reviewed and considered for accuracy and completeness, addended by me as needed, and agree with information above.  Signed,   Merri Ray, MD [facility] at [place].  03/26/17 8:06 AM

## 2017-03-25 NOTE — Patient Instructions (Addendum)
Thanks for coming in today. I rechecked your A1c, cholesterol levels as well as electrolytes. No change in medications for now. Let me know if there are any questions. Depending on hemoglobin A1c results, follow-up in 3-6 months.  See information below on tinnitus. Background noises often helpful. If you have any acute change in hearing, I would like you to be evaluated by ear nose and throat. I can place a referral if needed.  Tinnitus Tinnitus refers to hearing a sound when there is no actual source for that sound. This is often described as ringing in the ears. However, people with this condition may hear a variety of noises. A person may hear the sound in one ear or in both ears. The sounds of tinnitus can be soft, loud, or somewhere in between. Tinnitus can last for a few seconds or can be constant for days. It may go away without treatment and come back at various times. When tinnitus is constant or happens often, it can lead to other problems, such as trouble sleeping and trouble concentrating. Almost everyone experiences tinnitus at some point. Tinnitus that is long-lasting (chronic) or comes back often is a problem that may require medical attention. What are the causes? The cause of tinnitus is often not known. In some cases, it can result from other problems or conditions, including:  Exposure to loud noises from machinery, music, or other sources.  Hearing loss.  Ear or sinus infections.  Earwax buildup.  A foreign object in the ear.  Use of certain medicines.  Use of alcohol and caffeine.  High blood pressure.  Heart diseases.  Anemia.  Allergies.  Meniere disease.  Thyroid problems.  Tumors.  An enlarged part of a weakened blood vessel (aneurysm).  What are the signs or symptoms? The main symptom of tinnitus is hearing a sound when there is no source for that sound. It may sound like:  Buzzing.  Roaring.  Ringing.  Blowing air, similar to the sound  heard when you listen to a seashell.  Hissing.  Whistling.  Sizzling.  Humming.  Running water.  A sustained musical note.  How is this diagnosed? Tinnitus is diagnosed based on your symptoms. Your health care provider will do a physical exam. A comprehensive hearing exam (audiologic exam) will be done if your tinnitus:  Affects only one ear (unilateral).  Causes hearing difficulties.  Lasts 6 months or longer.  You may also need to see a health care provider who specializes in hearing disorders (audiologist). You may be asked to complete a questionnaire to determine the severity of your tinnitus. Tests may be done to help determine the cause and to rule out other conditions. These can include:  Imaging studies of your head and brain, such as: ? A CT scan. ? An MRI.  An imaging study of your blood vessels (angiogram).  How is this treated? Treating an underlying medical condition can sometimes make tinnitus go away. If your tinnitus continues, other treatments may include:  Medicines, such as certain antidepressants or sleeping aids.  Sound generators to mask the tinnitus. These include: ? Tabletop sound machines that play relaxing sounds to help you fall asleep. ? Wearable devices that fit in your ear and play sounds or music. ? A small device that uses headphones to deliver a signal embedded in music (acoustic neural stimulation). In time, this may change the pathways of your brain and make you less sensitive to tinnitus. This device is used for very severe cases when  no other treatment is working.  Therapy and counseling to help you manage the stress of living with tinnitus.  Using hearing aids or cochlear implants, if your tinnitus is related to hearing loss.  Follow these instructions at home:  When possible, avoid being in loud places and being exposed to loud sounds.  Wear hearing protection, such as earplugs, when you are exposed to loud noises.  Do not take  stimulants, such as nicotine, alcohol, or caffeine.  Practice techniques for reducing stress, such as meditation, yoga, or deep breathing.  Use a white noise machine, a humidifier, or other devices to mask the sound of tinnitus.  Sleep with your head slightly raised. This may reduce the impact of tinnitus.  Try to get plenty of rest each night. Contact a health care provider if:  You have tinnitus in just one ear.  Your tinnitus continues for 3 weeks or longer without stopping.  Home care measures are not helping.  You have tinnitus after a head injury.  You have tinnitus along with any of the following: ? Dizziness. ? Loss of balance. ? Nausea and vomiting. This information is not intended to replace advice given to you by your health care provider. Make sure you discuss any questions you have with your health care provider. Document Released: 03/05/2005 Document Revised: 11/06/2015 Document Reviewed: 08/05/2013 Elsevier Interactive Patient Education  2018 [employer] you healthy  Get these tests  Blood pressure- Have your blood pressure checked once a year by your healthcare provider.  Normal blood pressure is 120/80  Weight- Have your body mass index (BMI) calculated to screen for obesity.  BMI is a measure of body fat based on height and weight. You can also calculate your own BMI at ViewBanking.si.  Cholesterol- Have your cholesterol checked every year.  Diabetes- Have your blood sugar checked regularly if you have high blood pressure, high cholesterol, have a family history of diabetes or if you are overweight.  Screening for Colon Cancer- Colonoscopy starting at age 71.  Screening may begin sooner depending on your family history and other health conditions. Follow up colonoscopy as directed by your Gastroenterologist.  Screening for Prostate Cancer- Both blood work (PSA) and a rectal exam help screen for Prostate Cancer.  Screening begins at age  51 with African-American men and at age 29 with Caucasian men.  Screening may begin sooner depending on your family history.  Take these medicines  Aspirin- One aspirin daily can help prevent Heart disease and Stroke.  Flu shot- Every fall.  Tetanus- Every 10 years.  Zostavax- Once after the age of 80 to prevent Shingles.  Pneumonia shot- Once after the age of 34; if you are younger than 62, ask your healthcare provider if you need a Pneumonia shot.  Take these steps  Don't smoke- If you do smoke, talk to your doctor about quitting.  For tips on how to quit, go to www.smokefree.gov or call 1-800-QUIT-NOW.  Be physically active- Exercise 5 days a week for at least 30 minutes.  If you are not already physically active start slow and gradually work up to 30 minutes of moderate physical activity.  Examples of moderate activity include walking briskly, mowing the yard, dancing, swimming, bicycling, etc.  Eat a healthy diet- Eat a variety of healthy food such as fruits, vegetables, low fat milk, low fat cheese, yogurt, lean meant, poultry, fish, beans, tofu, etc. For more information go to www.thenutritionsource.org  Drink alcohol in moderation- Limit alcohol intake  to less than two drinks a day. Never drink and drive.  Dentist- Brush and floss twice daily; visit your dentist twice a year.  Depression- Your emotional health is as important as your physical health. If you're feeling down, or losing interest in things you would normally enjoy please talk to your healthcare provider.  Eye exam- Visit your eye doctor every year.  Safe sex- If you may be exposed to a sexually transmitted infection, use a condom.  Seat belts- Seat belts can save your life; always wear one.  Smoke/Carbon Monoxide detectors- These detectors need to be installed on the appropriate level of your home.  Replace batteries at least once a year.  Skin cancer- When out in the sun, cover up and use sunscreen 15 SPF or  higher.  Violence- If anyone is threatening you, please tell your healthcare provider.  Living Will/ Health care power of attorney- Speak with your healthcare provider and family.   IF you received an x-ray today, you will receive an invoice from [facility]. Please contact [facility] at [phone] with questions or concerns regarding your invoice.   IF you received labwork today, you will receive an invoice from [place]. Please contact [facility] at [phone] with questions or concerns regarding your invoice.   Our billing staff will not be able to assist you with questions regarding bills from these companies.  You will be contacted with the lab results as soon as they are available. The fastest way to get your results is to activate your My Chart account. Instructions are located on the last page of this paperwork. If you have not heard from [place] regarding the results in 2 weeks, please contact this office.

## 2017-03-26 ENCOUNTER — Encounter: Payer: Self-pay | Admitting: Family Medicine

## 2017-03-26 LAB — LIPID PANEL
CHOLESTEROL TOTAL: 174 mg/dL (ref 100–199)
Chol/HDL Ratio: 4.1 ratio (ref 0.0–5.0)
HDL: 42 mg/dL (ref >39)
LDL CALC: 97 mg/dL (ref 0–99)
Triglycerides: 177 mg/dL — ABNORMAL HIGH (ref 0–149)
VLDL CHOLESTEROL CAL: 35 mg/dL (ref 5–40)

## 2017-03-26 LAB — HEMOGLOBIN A1C
ESTIMATED AVERAGE GLUCOSE: 143 mg/dL
Hgb A1c MFr Bld: 6.6 % — ABNORMAL HIGH (ref 4.8–5.6)

## 2017-03-26 LAB — COMPREHENSIVE METABOLIC PANEL
ALBUMIN: 4.9 g/dL (ref 3.5–5.5)
ALK PHOS: 52 IU/L (ref 39–117)
ALT: 24 IU/L (ref 0–44)
AST: 29 IU/L (ref 0–40)
Albumin/Globulin Ratio: 2 (ref 1.2–2.2)
BUN / CREAT RATIO: 13 (ref 9–20)
BUN: 11 mg/dL (ref 6–24)
Bilirubin Total: 0.5 mg/dL (ref 0.0–1.2)
CO2: 22 mmol/L (ref 20–29)
Calcium: 10 mg/dL (ref 8.7–10.2)
Chloride: 101 mmol/L (ref 96–106)
Creatinine, Ser: 0.87 mg/dL (ref 0.76–1.27)
GFR calc Af Amer: 112 mL/min/{1.73_m2} (ref >59)
GFR calc non Af Amer: 96 mL/min/{1.73_m2} (ref >59)
GLUCOSE: 120 mg/dL — AB (ref 65–99)
Globulin, Total: 2.5 g/dL (ref 1.5–4.5)
Potassium: 5 mmol/L (ref 3.5–5.2)
Sodium: 144 mmol/L (ref 134–144)
Total Protein: 7.4 g/dL (ref 6.0–8.5)

## 2017-03-26 LAB — HEPATITIS C ANTIBODY: Hep C Virus Ab: <0.1 s/co ratio (ref 0.0–0.9)

## 2017-03-26 LAB — PSA: Prostate Specific Ag, Serum: 0.8 ng/mL (ref 0.0–4.0)

## 2017-03-26 LAB — HIV ANTIBODY (ROUTINE TESTING W REFLEX): HIV Screen 4th Generation wRfx: NONREACTIVE

## 2017-04-15 DIAGNOSIS — G4733 Obstructive sleep apnea (adult) (pediatric): Secondary | ICD-10-CM | POA: Diagnosis not present

## 2017-05-15 DIAGNOSIS — G4733 Obstructive sleep apnea (adult) (pediatric): Secondary | ICD-10-CM | POA: Diagnosis not present

## 2017-05-18 ENCOUNTER — Other Ambulatory Visit: Payer: Self-pay | Admitting: Cardiology

## 2017-05-20 NOTE — Telephone Encounter (Signed)
Rx(s) sent to pharmacy electronically.  

## 2017-06-14 DIAGNOSIS — G4733 Obstructive sleep apnea (adult) (pediatric): Secondary | ICD-10-CM | POA: Diagnosis not present

## 2017-07-14 DIAGNOSIS — G4733 Obstructive sleep apnea (adult) (pediatric): Secondary | ICD-10-CM | POA: Diagnosis not present

## 2017-08-13 DIAGNOSIS — G4733 Obstructive sleep apnea (adult) (pediatric): Secondary | ICD-10-CM | POA: Diagnosis not present

## 2017-08-27 DIAGNOSIS — D225 Melanocytic nevi of trunk: Secondary | ICD-10-CM | POA: Diagnosis not present

## 2017-08-27 DIAGNOSIS — L570 Actinic keratosis: Secondary | ICD-10-CM | POA: Diagnosis not present

## 2017-08-27 DIAGNOSIS — D1801 Hemangioma of skin and subcutaneous tissue: Secondary | ICD-10-CM | POA: Diagnosis not present

## 2017-08-27 DIAGNOSIS — D485 Neoplasm of uncertain behavior of skin: Secondary | ICD-10-CM | POA: Diagnosis not present

## 2017-08-27 DIAGNOSIS — L814 Other melanin hyperpigmentation: Secondary | ICD-10-CM | POA: Diagnosis not present

## 2017-08-27 DIAGNOSIS — L821 Other seborrheic keratosis: Secondary | ICD-10-CM | POA: Diagnosis not present

## 2017-09-06 ENCOUNTER — Other Ambulatory Visit: Payer: Self-pay | Admitting: Family Medicine

## 2017-09-06 DIAGNOSIS — E785 Hyperlipidemia, unspecified: Secondary | ICD-10-CM

## 2017-09-06 NOTE — Telephone Encounter (Signed)
Crestor 40 mg refill request  LOV 03/25/17 with Dr. Carlota Raspberry   No future F/U appts noted.   Per notes wanted him seen again in 3-6 months.  [facility], [place]

## 2017-09-10 ENCOUNTER — Other Ambulatory Visit: Payer: Self-pay | Admitting: Cardiology

## 2017-09-12 DIAGNOSIS — G4733 Obstructive sleep apnea (adult) (pediatric): Secondary | ICD-10-CM | POA: Diagnosis not present

## 2017-09-18 ENCOUNTER — Encounter: Payer: Self-pay | Admitting: Family Medicine

## 2017-09-18 LAB — HM DIABETES EYE EXAM

## 2017-10-02 ENCOUNTER — Encounter: Payer: Self-pay | Admitting: *Deleted

## 2017-10-05 ENCOUNTER — Other Ambulatory Visit: Payer: Self-pay | Admitting: Family Medicine

## 2017-10-05 DIAGNOSIS — E785 Hyperlipidemia, unspecified: Secondary | ICD-10-CM

## 2017-10-09 ENCOUNTER — Other Ambulatory Visit: Payer: Self-pay | Admitting: Family Medicine

## 2017-10-09 DIAGNOSIS — E785 Hyperlipidemia, unspecified: Secondary | ICD-10-CM

## 2017-10-11 NOTE — Telephone Encounter (Signed)
rosuvastatin refill Last Refill:09/07/17 # 30 Last OV: 04/04/17 PCP: Dr. Carlota Raspberry Pharmacy:[facility] liver: 03/25/17  Lipids: 03/25/17

## 2017-10-12 DIAGNOSIS — G4733 Obstructive sleep apnea (adult) (pediatric): Secondary | ICD-10-CM | POA: Diagnosis not present

## 2017-10-23 ENCOUNTER — Other Ambulatory Visit: Payer: Self-pay | Admitting: Family Medicine

## 2017-10-23 DIAGNOSIS — E785 Hyperlipidemia, unspecified: Secondary | ICD-10-CM

## 2017-11-07 ENCOUNTER — Other Ambulatory Visit: Payer: Self-pay | Admitting: Cardiology

## 2017-11-07 NOTE — Telephone Encounter (Signed)
Rx sent to pharmacy   

## 2017-11-11 DIAGNOSIS — G4733 Obstructive sleep apnea (adult) (pediatric): Secondary | ICD-10-CM | POA: Diagnosis not present

## 2017-11-15 ENCOUNTER — Other Ambulatory Visit: Payer: Self-pay

## 2017-11-15 ENCOUNTER — Ambulatory Visit: Payer: BLUE CROSS/BLUE SHIELD | Admitting: Family Medicine

## 2017-11-15 ENCOUNTER — Encounter: Payer: Self-pay | Admitting: Family Medicine

## 2017-11-15 VITALS — BP 127/78 | HR 66 | Temp 97.8°F | Resp 16 | Ht 70.87 in | Wt 204.0 lb

## 2017-11-15 DIAGNOSIS — E781 Pure hyperglyceridemia: Secondary | ICD-10-CM

## 2017-11-15 DIAGNOSIS — E785 Hyperlipidemia, unspecified: Secondary | ICD-10-CM

## 2017-11-15 DIAGNOSIS — E119 Type 2 diabetes mellitus without complications: Secondary | ICD-10-CM

## 2017-11-15 DIAGNOSIS — I10 Essential (primary) hypertension: Secondary | ICD-10-CM

## 2017-11-15 DIAGNOSIS — E1165 Type 2 diabetes mellitus with hyperglycemia: Secondary | ICD-10-CM | POA: Diagnosis not present

## 2017-11-15 LAB — COMPREHENSIVE METABOLIC PANEL
A/G RATIO: 1.8 (ref 1.2–2.2)
ALK PHOS: 56 IU/L (ref 39–117)
ALT: 23 IU/L (ref 0–44)
AST: 22 IU/L (ref 0–40)
Albumin: 4.6 g/dL (ref 3.5–5.5)
BILIRUBIN TOTAL: 0.4 mg/dL (ref 0.0–1.2)
BUN / CREAT RATIO: 12 (ref 9–20)
BUN: 12 mg/dL (ref 6–24)
CO2: 24 mmol/L (ref 20–29)
Calcium: 9.7 mg/dL (ref 8.7–10.2)
Chloride: 100 mmol/L (ref 96–106)
Creatinine, Ser: 0.99 mg/dL (ref 0.76–1.27)
GFR calc Af Amer: 97 mL/min/{1.73_m2} (ref >59)
GFR calc non Af Amer: 84 mL/min/{1.73_m2} (ref >59)
Globulin, Total: 2.6 g/dL (ref 1.5–4.5)
Glucose: 113 mg/dL — ABNORMAL HIGH (ref 65–99)
POTASSIUM: 5.1 mmol/L (ref 3.5–5.2)
Sodium: 141 mmol/L (ref 134–144)
Total Protein: 7.2 g/dL (ref 6.0–8.5)

## 2017-11-15 LAB — GLUCOSE, POCT (MANUAL RESULT ENTRY): POC Glucose: 99 mg/dl (ref 70–99)

## 2017-11-15 LAB — LIPID PANEL
CHOLESTEROL TOTAL: 145 mg/dL (ref 100–199)
Chol/HDL Ratio: 3.9 ratio (ref 0.0–5.0)
HDL: 37 mg/dL — ABNORMAL LOW (ref >39)
LDL Calculated: 77 mg/dL (ref 0–99)
TRIGLYCERIDES: 156 mg/dL — AB (ref 0–149)
VLDL Cholesterol Cal: 31 mg/dL (ref 5–40)

## 2017-11-15 LAB — HEMOGLOBIN A1C
ESTIMATED AVERAGE GLUCOSE: 151 mg/dL
Hgb A1c MFr Bld: 6.9 % — ABNORMAL HIGH (ref 4.8–5.6)

## 2017-11-15 MED ORDER — METFORMIN HCL 850 MG PO TABS: 850.0000 mg | ORAL_TABLET | Freq: Two times a day (BID) | ORAL | 1 refills | Status: DC

## 2017-11-15 MED ORDER — ROSUVASTATIN CALCIUM 40 MG PO TABS: 40.0000 mg | ORAL_TABLET | Freq: Every day | ORAL | 1 refills | Status: DC

## 2017-11-15 NOTE — Patient Instructions (Addendum)
   I will check A1c today, but be careful with any sugar intake, and portions.  Additionally restarting exercise should help.  Plan to recheck in 3 months, but I will let you know if medication changes needed.  If you have lab work done today you will be contacted with your lab results within the next 2 weeks.  If you have not heard from [place] then please contact us. The fastest way to get your results is to register for My Chart.   IF you received an x-ray today, you will receive an invoice from [facility]. Please contact [facility] at [phone] with questions or concerns regarding your invoice.   IF you received labwork today, you will receive an invoice from [place]. Please contact [facility] at [phone] with questions or concerns regarding your invoice.   Our billing staff will not be able to assist you with questions regarding bills from these companies.  You will be contacted with the lab results as soon as they are available. The fastest way to get your results is to activate your My Chart account. Instructions are located on the last page of this paperwork. If you have not heard from [place] regarding the results in 2 weeks, please contact this office.

## 2017-11-15 NOTE — Progress Notes (Signed)
Subjective:  By signing my name below, I, Essence Howell, attest that this documentation has been prepared under the direction and in the presence of [name], MD Electronically Signed: Ladene Artist, ED Scribe 11/15/2017 at 8:39 AM.   Patient ID: Scott White, male    DOB: 14-Jan-1961, 57 y.o.   MRN: [id number]  Chief Complaint  Patient presents with  . Hyperlipidemia    6 month follow-up   . Diabetes   HPI Scott White "Scott White" is a 57 y.o. male who presents to Primary Care at [facility] for f/u. Pt is fasting at this visit.  DM Lab Results  Component Value Date   HGBA1C 6.6 (H) 03/25/2017  Continued on same of Metformin 850 mg bid. Optho: 09/18/17. Foot: 11/15/17. Pneumovax: 03/25/17. Urine micro: 06/25/16, normal. - Pt has not been checking fasting blood glucose, reports blood glucose readings of 300s-400s with eating M&Ms. He has also been exercising less. Denies new side-effects from meds.  Hyperlipidemia Lab Results  Component Value Date   CHOL 174 03/25/2017   HDL 42 03/25/2017   LDLCALC 97 03/25/2017   LDLDIRECT 92.8 11/04/2012   TRIG 177 (H) 03/25/2017   CHOLHDL 4.1 03/25/2017   Lab Results  Component Value Date   ALT 24 03/25/2017   AST 29 03/25/2017   ALKPHOS 52 03/25/2017   BILITOT 0.5 03/25/2017  Crestor 40 mg qd.  HTN Lab Results  Component Value Date   CREATININE 0.87 03/25/2017  Lisinopril 20 qd ad Toprol 25 mg qd which he also takes for rate control of a-fib, pradaxa for anitcoag of a-fib. - He reports occasional lightheadedness with standing quickly with resolves within a few seconds. Denies any other side-effects.  Patient Active Problem List   Diagnosis Date Noted  . Complex sleep apnea syndrome 09/22/2010  . Long term (current) use of anticoagulants 07/03/2010  . Long term current use of anticoagulant 05/31/2010  . DYSLIPIDEMIA 09/27/2008  . HYPERTRIGLYCERIDEMIA 02/11/2008  . ESSENTIAL HYPERTENSION, BENIGN 02/11/2008  . CORONARY  ATHEROSCLEROSIS NATIVE CORONARY ARTERY 02/11/2008  . ATRIAL FIBRILLATION 02/11/2008   Past Medical History:  Diagnosis Date  . Atrial fibrillation ([place])    a. Persistent, s/p afib ablation by [facility] 08/16/10;  b. s/p failed DCCV 05/2011; c. Chronic Tikosyn & Pradaxa (CHA2DS2VASc = 3-4).  [name] CAD (coronary artery disease)    a. 07/2002 Inf MI s/p BMS -->RCA, otw nonobs dzs;  b. 12/2011 Neg MV.  . Chronic diastolic CHF (congestive heart failure) ([place])   . Diabetes ([place])   . HTN (hypertension)   . Hyperlipidemia   . PVC's (premature ventricular contractions)   . Sleep apnea    followed by Dr Gwenette Greet   Past Surgical History:  Procedure Laterality Date  . atrial ablatio  08/16/10   afib ablation by [facility]  . CARDIAC CATHETERIZATION  2004    Successful PTCA and stent placement in the mid right   coronary artery with an extra 99% narrowing to 0% with placement of a 3.5 x  20 mm Express 2 stent with improvement of TIMI grade 1 flow to TIMI grade 3.  . CARDIOVERSION  06/15/2011   Procedure: CARDIOVERSION;  Surgeon: Fay Records, MD;  Location: [place];  Service: Cardiovascular;  Laterality: N/A;  . tonsilectomy     No Known Allergies Prior to Admission medications   Medication Sig Start Date End Date Taking? Authorizing Provider  dofetilide (TIKOSYN) 500 MCG capsule Take 1 capsule (500 mcg total) by mouth 2 (two)  times daily. 03/18/17  Yes Lelon Perla, MD  lisinopril (PRINIVIL,ZESTRIL) 20 MG tablet TAKE 1 TABLET (20 MG TOTAL) BY MOUTH EVERY EVENING. 05/20/17  Yes Crenshaw, Denice Bors, MD  Magnesium 200 MG TABS Take 1 tablet by mouth daily.   Yes [provider]  metFORMIN (GLUCOPHAGE) 850 MG tablet Take 1 tablet (850 mg total) by mouth 2 (two) times daily with a meal. 03/25/17  Yes [name], MD  metoprolol succinate (TOPROL-XL) 25 MG 24 hr tablet Take 1 tablet (25 mg total) by mouth daily. 03/18/17  Yes Lelon Perla, MD  metoprolol succinate (TOPROL-XL) 25 MG 24 hr tablet TAKE 1 TABLET  (25 MG TOTAL) BY MOUTH DAILY. 09/10/17  Yes Lelon Perla, MD  Multiple Vitamins-Minerals (CENTRUM PO) Take 1 tablet by mouth daily.    Yes [provider]  nitroGLYCERIN (NITROSTAT) 0.4 MG SL tablet Place 1 tablet (0.4 mg total) under the tongue every 5 (five) minutes as needed. For chest pain at 3rd dose call 911 08/09/16  Yes [name], MD  Omega-3 Fatty Acids (FISH OIL PO) Take by mouth daily.   Yes [provider]  PRADAXA 150 MG CAPS capsule TAKE ONE CAPSULE BY MOUTH TWICE A DAY 11/07/17  Yes Lelon Perla, MD  rosuvastatin (CRESTOR) 40 MG tablet TAKE 1 TABLET BY MOUTH EVERY DAY 10/23/17  Yes [name], MD  tadalafil (CIALIS) 10 MG tablet Take 1 tablet (10 mg total) by mouth daily as needed for erectile dysfunction. 03/07/17  Yes Almyra Deforest, PA   Social History   Socioeconomic History  . Marital status: Single    Spouse name: Not on file  . Number of children: 0  . Years of education: Not on file  . Highest education level: Not on file  Occupational History  . 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 Financial resource strain: Not on file  . Food insecurity:    Worry: Not on file    Inability: Not on file  . Transportation needs:    Medical: Not on file    Non-medical: Not on file  Tobacco Use  . Smoking status: Never Smoker  . Smokeless tobacco: Never Used  Substance and Sexual Activity  . Alcohol use: Yes    Alcohol/week: 1.0 standard drinks    Types: 1 Cans of beer per week    Comment:  three times a week  . Drug use: No  . Sexual activity: Yes  Lifestyle  . Physical activity:    Days per week: Not on file    Minutes per session: Not on file  . Stress: Not on file  Relationships  . Social connections:    Talks on phone: Not on file    Gets together: Not on file    Attends religious service: Not on file    Active member of club or organization: Not on file    Attends meetings of clubs or  organizations: Not on file    Relationship status: Not on file  . Intimate partner violence:    Fear of current or ex partner: Not on file    Emotionally abused: Not on file    Physically abused: Not on file    Forced sexual activity: Not on file  Other Topics Concern  . Not on file  Social History Narrative   Pt lives in [place] alone.  Single.   He is a Teacher, English as a foreign language for  [employer]   Review of Systems  Constitutional: Negative for fatigue and unexpected weight change.  Eyes: Negative for visual disturbance.  Respiratory: Negative for cough, chest tightness and shortness of breath.   Cardiovascular: Negative for chest pain, palpitations and leg swelling.  Gastrointestinal: Negative for abdominal pain, blood in stool and diarrhea.  Neurological: Negative for dizziness, light-headedness (occasionally with standing quickly) and headaches.      Objective:   Physical Exam  Constitutional: He is oriented to person, place, and time. He appears well-developed and well-nourished.  HENT:  Head: Normocephalic and atraumatic.  Eyes: Pupils are equal, round, and reactive to light. EOM are normal.  Neck: No JVD present. Carotid bruit is not present.  Cardiovascular: Normal rate, regular rhythm and normal heart sounds.  No murmur heard. Pulmonary/Chest: Effort normal and breath sounds normal. He has no rales.  Musculoskeletal: He exhibits no edema.  Neurological: He is alert and oriented to person, place, and time.  Skin: Skin is warm and dry.  Psychiatric: He has a normal mood and affect.  Vitals reviewed.  Vitals:   11/15/17 0759  BP: 127/78  Pulse: 66  Resp: 16  Temp: 97.8 F (36.6 C)  TempSrc: Oral  SpO2: 97%  Weight: 204 lb (92.5 kg)  Height: 5' 10.87" (1.8 m)   Results for orders placed or performed in visit on 11/15/17  POCT glucose (manual entry)  Result Value Ref Range   POC Glucose 99 70 - 99 mg/dl      Assessment & Plan:  Gianlucca Szymborski Frisinger is  a 57 y.o. male Essential hypertension, benign - Plan: Comprehensive metabolic panel, Lipid panel  -Stable, no med changes.  Continue follow-up with cardiology as planned.  HYPERTRIGLYCERIDEMIA - Plan: Lipid panel  -Continue Crestor, refilled as tolerating.  Check labs  Type 2 diabetes mellitus with hyperglycemia, without long-term current use of insulin (HCC) - Plan: Hemoglobin A1c, POCT glucose (manual entry), Microalbumin/Creatinine Ratio, Urine  -Caution with sugar containing foods and portions in diet, and restart exercise.  -Check A1c determine if changes needed, continue metformin same dose for now.  No orders of the defined types were placed in this encounter.  Patient Instructions     I will check A1c today, but be careful with any sugar intake, and portions.  Additionally restarting exercise should help.  Plan to recheck in 3 months, but I will let you know if medication changes needed.  If you have lab work done today you will be contacted with your lab results within the next 2 weeks.  If you have not heard from [place] then please contact us. The fastest way to get your results is to register for My Chart.   IF you received an x-ray today, you will receive an invoice from [facility]. Please contact [facility] at [phone] with questions or concerns regarding your invoice.   IF you received labwork today, you will receive an invoice from [place]. Please contact [facility] at [phone] with questions or concerns regarding your invoice.   Our billing staff will not be able to assist you with questions regarding bills from these companies.  You will be contacted with the lab results as soon as they are available. The fastest way to get your results is to activate your My Chart account. Instructions are located on the last page of this paperwork. If you have not heard from [place] regarding the results in 2 weeks, please contact this office.      I  personally  performed the services described in this documentation, which was scribed in my presence. The recorded information has been reviewed and considered for accuracy and completeness, addended by me as needed, and agree with information above.  Signed,   Merri Ray, MD [facility].  11/15/17 6:32 PM

## 2017-11-16 LAB — MICROALBUMIN / CREATININE URINE RATIO
CREATININE, UR: 24.8 mg/dL
Microalb/Creat Ratio: <12.1 mg/g creat (ref 0.0–30.0)
Microalbumin, Urine: <3 ug/mL

## 2017-12-11 DIAGNOSIS — G4733 Obstructive sleep apnea (adult) (pediatric): Secondary | ICD-10-CM | POA: Diagnosis not present

## 2018-01-10 DIAGNOSIS — G4733 Obstructive sleep apnea (adult) (pediatric): Secondary | ICD-10-CM | POA: Diagnosis not present

## 2018-02-01 ENCOUNTER — Other Ambulatory Visit: Payer: Self-pay | Admitting: Cardiology

## 2018-02-10 DIAGNOSIS — G4733 Obstructive sleep apnea (adult) (pediatric): Secondary | ICD-10-CM | POA: Diagnosis not present

## 2018-02-18 ENCOUNTER — Other Ambulatory Visit: Payer: Self-pay

## 2018-02-18 MED ORDER — METOPROLOL SUCCINATE ER 25 MG PO TB24: 25.0000 mg | ORAL_TABLET | Freq: Every day | ORAL | 3 refills | Status: DC

## 2018-02-18 MED ORDER — DOFETILIDE 500 MCG PO CAPS: 500.0000 ug | ORAL_CAPSULE | Freq: Two times a day (BID) | ORAL | 3 refills | Status: DC

## 2018-02-21 ENCOUNTER — Encounter: Payer: Self-pay | Admitting: Family Medicine

## 2018-02-21 ENCOUNTER — Ambulatory Visit: Payer: BLUE CROSS/BLUE SHIELD | Admitting: Family Medicine

## 2018-02-21 VITALS — BP 123/71 | HR 75 | Temp 98.1°F | Resp 16 | Ht 70.0 in | Wt 207.0 lb

## 2018-02-21 DIAGNOSIS — E119 Type 2 diabetes mellitus without complications: Secondary | ICD-10-CM | POA: Diagnosis not present

## 2018-02-21 DIAGNOSIS — I10 Essential (primary) hypertension: Secondary | ICD-10-CM | POA: Diagnosis not present

## 2018-02-21 DIAGNOSIS — I4891 Unspecified atrial fibrillation: Secondary | ICD-10-CM

## 2018-02-21 LAB — POCT GLYCOSYLATED HEMOGLOBIN (HGB A1C): Hemoglobin A1C: 6.8 % — AB (ref 4.0–5.6)

## 2018-02-21 NOTE — Patient Instructions (Signed)
° ° ° °  If you have lab work done today you will be contacted with your lab results within the next 2 weeks.  If you have not heard from us then please contact us. The fastest way to get your results is to register for My Chart. ° ° °IF you received an x-ray today, you will receive an invoice from [facility] [facility]. Please contact [facility] at [phone] with questions or concerns regarding your invoice.  ° °IF you received labwork today, you will receive an invoice from [facility]. Please contact [facility] at [phone] with questions or concerns regarding your invoice.  ° °Our billing staff will not be able to assist you with questions regarding bills from these companies. ° °You will be contacted with the lab results as soon as they are available. The fastest way to get your results is to activate your My Chart account. Instructions are located on the last page of this paperwork. If you have not heard from us regarding the results in 2 weeks, please contact this office. °  ° ° ° °

## 2018-02-21 NOTE — Progress Notes (Signed)
Subjective:    Patient ID: Scott White, male    DOB: 1960/10/03, 57 y.o.   MRN: [id number]  HPI Scott White is a 57 y.o. male Presents today for: Chief Complaint  Patient presents with  . Diabetes    3 month check   Diabetes: Lab Results  Component Value Date   HGBA1C 6.9 (H) 11/15/2017   HGBA1C 6.6 (H) 03/25/2017   HGBA1C 6.9 (H) 06/25/2016   Lab Results  Component Value Date   MICROALBUR <0.2 03/07/2015   LDLCALC 77 11/15/2017   CREATININE 0.99 11/15/2017  Normal microalbumin creatinine ratio 11/15/2017 Up-to-date on Pneumovax, foot exam, eye exam. Currently taking metformin 850 mg twice daily On ACE inhibitor, on statin. Has restarted exercise- 3 times per week - goal of 4 days.   Has cut back on M&M's, cut back on beer.  No new side effects of meds, no missed doses.  Beet juice powder as supplement.   Hypertension: BP Readings from Last 3 Encounters:  02/21/18 123/71  11/15/17 127/78  03/25/17 118/70   Lab Results  Component Value Date   CREATININE 0.99 11/15/2017  Takes Toprol, lisinopril. No new side effects.  Wears CPAp.  If not using, may have palpitations.   Hyperlipidemia:  Lab Results  Component Value Date   CHOL 145 11/15/2017   HDL 37 (L) 11/15/2017   LDLCALC 77 11/15/2017   LDLDIRECT 92.8 11/04/2012   TRIG 156 (H) 11/15/2017   CHOLHDL 3.9 11/15/2017   Lab Results  Component Value Date   ALT 23 11/15/2017   AST 22 11/15/2017   ALKPHOS 56 11/15/2017   BILITOT 0.4 11/15/2017  Crestor 40 mg daily. No new myalgias.   Atrial fibrillation: Followed by cardiology, on beta-blocker for rate control, Pradaxa for anticoagulation, tikosyn as antiarhythmic.   History of CAD, chronic diastolic CHF followed by cardiology as well. Rare palpitations. Cards ov in few weeks.     Patient Active Problem List   Diagnosis Date Noted  . Complex sleep apnea syndrome 09/22/2010  . Long term (current) use of anticoagulants 07/03/2010  . Long term  current use of anticoagulant 05/31/2010  . DYSLIPIDEMIA 09/27/2008  . HYPERTRIGLYCERIDEMIA 02/11/2008  . ESSENTIAL HYPERTENSION, BENIGN 02/11/2008  . CORONARY ATHEROSCLEROSIS NATIVE CORONARY ARTERY 02/11/2008  . ATRIAL FIBRILLATION 02/11/2008   Past Medical History:  Diagnosis Date  . Atrial fibrillation ([place])    a. Persistent, s/p afib ablation by JA 08/16/10;  b. s/p failed DCCV 05/2011; c. Chronic Tikosyn & Pradaxa (CHA2DS2VASc = 3-4).  [name] CAD (coronary artery disease)    a. 07/2002 Inf MI s/p BMS -->RCA, otw nonobs dzs;  b. 12/2011 Neg MV.  . Chronic diastolic CHF (congestive heart failure) ([place])   . Diabetes ([place])   . HTN (hypertension)   . Hyperlipidemia   . PVC's (premature ventricular contractions)   . Sleep apnea    followed by Dr Gwenette Greet   Past Surgical History:  Procedure Laterality Date  . atrial ablatio  08/16/10   afib ablation by JA  . CARDIAC CATHETERIZATION  2004    Successful PTCA and stent placement in the mid right   coronary artery with an extra 99% narrowing to 0% with placement of a 3.5 x  20 mm Express 2 stent with improvement of TIMI grade 1 flow to TIMI grade 3.  . CARDIOVERSION  06/15/2011   Procedure: CARDIOVERSION;  Surgeon: Fay Records, MD;  Location: [facility];  Service: Cardiovascular;  Laterality: N/A;  . tonsilectomy  No Known Allergies Prior to Admission medications   Medication Sig Start Date End Date Taking? Authorizing Provider  Boron 3 MG CAPS Take by mouth.   Yes [provider]  dofetilide (TIKOSYN) 500 MCG capsule Take 1 capsule (500 mcg total) by mouth 2 (two) times daily. 02/18/18  Yes Lelon Perla, MD  Garlic 408 MG TABS Take by mouth.   Yes [provider]  Ginger, Zingiber officinalis, (GINGER PO) Take by mouth.   Yes [provider]  lisinopril (PRINIVIL,ZESTRIL) 20 MG tablet TAKE 1 TABLET (20 MG TOTAL) BY MOUTH EVERY EVENING. 02/03/18  Yes Crenshaw, Denice Bors, MD  Magnesium 200 MG TABS Take 1 tablet by mouth  daily.   Yes [provider]  metFORMIN (GLUCOPHAGE) 850 MG tablet Take 1 tablet (850 mg total) by mouth 2 (two) times daily with a meal. 11/15/17  Yes [name], MD  metoprolol succinate (TOPROL-XL) 25 MG 24 hr tablet TAKE 1 TABLET (25 MG TOTAL) BY MOUTH DAILY. 09/10/17  Yes Lelon Perla, MD  metoprolol succinate (TOPROL-XL) 25 MG 24 hr tablet Take 1 tablet (25 mg total) by mouth daily. 02/18/18  Yes Lelon Perla, MD  Multiple Vitamins-Minerals (CENTRUM PO) Take 1 tablet by mouth daily.    Yes [provider]  nitroGLYCERIN (NITROSTAT) 0.4 MG SL tablet Place 1 tablet (0.4 mg total) under the tongue every 5 (five) minutes as needed. For chest pain at 3rd dose call 911 08/09/16  Yes [name], MD  Omega-3 Fatty Acids (FISH OIL PO) Take by mouth daily.   Yes [provider]  PRADAXA 150 MG CAPS capsule TAKE ONE CAPSULE BY MOUTH TWICE A DAY 11/07/17  Yes Crenshaw, Denice Bors, MD  rosuvastatin (CRESTOR) 40 MG tablet Take 1 tablet (40 mg total) by mouth daily. 11/15/17  Yes [name], MD  tadalafil (CIALIS) 10 MG tablet Take 1 tablet (10 mg total) by mouth daily as needed for erectile dysfunction. 03/07/17  Yes Almyra Deforest, PA   Social History   Socioeconomic History  . Marital status: Single    Spouse name: Not on file  . Number of children: 0  . Years of education: Not on file  . Highest education level: Not on file  Occupational History  . 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 Financial resource strain: Not on file  . Food insecurity:    Worry: Not on file    Inability: Not on file  . Transportation needs:    Medical: Not on file    Non-medical: Not on file  Tobacco Use  . Smoking status: Never Smoker  . Smokeless tobacco: Never Used  Substance and Sexual Activity  . Alcohol use: Yes    Alcohol/week: 1.0 standard drinks    Types: 1 Cans of beer per week    Comment:  three times a week  . Drug  use: No  . Sexual activity: Yes  Lifestyle  . Physical activity:    Days per week: Not on file    Minutes per session: Not on file  . Stress: Not on file  Relationships  . Social connections:    Talks on phone: Not on file    Gets together: Not on file    Attends religious service: Not on file    Active member of club or organization: Not on file    Attends meetings of clubs or organizations: Not on file  Relationship status: Not on file  . Intimate partner violence:    Fear of current or ex partner: Not on file    Emotionally abused: Not on file    Physically abused: Not on file    Forced sexual activity: Not on file  Other Topics Concern  . Not on file  Social History Narrative   Pt lives in [place] alone.  Single.   He is a Teacher, English as a foreign language for [employer]    Review of Systems  Constitutional: Negative for fatigue and unexpected weight change.  Eyes: Negative for visual disturbance.  Respiratory: Negative for cough, chest tightness and shortness of breath.   Cardiovascular: Negative for chest pain, palpitations and leg swelling.  Gastrointestinal: Negative for abdominal pain and blood in stool.  Neurological: Negative for dizziness, light-headedness and headaches.       Objective:   Physical Exam  Constitutional: He is oriented to person, place, and time. He appears well-developed and well-nourished.  HENT:  Head: Normocephalic and atraumatic.  Eyes: Pupils are equal, round, and reactive to light. EOM are normal.  Neck: No JVD present. Carotid bruit is not present.  Cardiovascular: Normal rate, regular rhythm and normal heart sounds.  No murmur heard. Pulmonary/Chest: Effort normal and breath sounds normal. He has no rales.  Musculoskeletal: He exhibits no edema.  Neurological: He is alert and oriented to person, place, and time.  Skin: Skin is warm and dry.  Psychiatric: He has a normal mood and affect.  Vitals reviewed.  Vitals:   02/21/18  0814  BP: 123/71  Pulse: 75  Resp: 16  Temp: 98.1 F (36.7 C)  TempSrc: Oral  SpO2: 96%  Weight: 207 lb (93.9 kg)  Height: 5\' 10"  (1.778 m)   Results for orders placed or performed in visit on 02/21/18  POCT glycosylated hemoglobin (Hb A1C)  Result Value Ref Range   Hemoglobin A1C 6.8 (A) 4.0 - 5.6 %   HbA1c POC (<> result, manual entry)     HbA1c, POC (prediabetic range)     HbA1c, POC (controlled diabetic range)         Assessment & Plan:  Mychal Decarlo Scholes is a 57 y.o. male Type 2 diabetes mellitus without complication, without long-term current use of insulin ([place]) - Plan: POCT glycosylated hemoglobin (Hb A1C)  -Tolerating current regimen.  Commended on diet changes, stable A1c.  No med changes.  Essential hypertension  -Stable, no changes.  Continue follow-up with cardiology as planned  Atrial fibrillation, unspecified type [facility])  -May have some symptoms if off CPAP, but overall intermittent.  Continue CPAP, continue same regimen, follow up as planned with cardiology  No orders of the defined types were placed in this encounter.  Patient Instructions       If you have lab work done today you will be contacted with your lab results within the next 2 weeks.  If you have not heard from [place] then please contact us. The fastest way to get your results is to register for My Chart.   IF you received an x-ray today, you will receive an invoice from [facility]. Please contact [facility] Radiology at [phone] with questions or concerns regarding your invoice.   IF you received labwork today, you will receive an invoice from [place]. Please contact [facility] at [phone] with questions or concerns regarding your invoice.   Our billing staff will not be able to assist you with questions regarding bills from these companies.  You will be  contacted with the lab results as soon as they are available. The fastest way to get your results is to activate your My  Chart account. Instructions are located on the last page of this paperwork. If you have not heard from [place] regarding the results in 2 weeks, please contact this office.       Signed,   Merri Ray, MD [facility].  02/21/18 8:36 AM

## 2018-02-23 ENCOUNTER — Encounter: Payer: Self-pay | Admitting: Family Medicine

## 2018-02-26 NOTE — Progress Notes (Signed)
HPI: FU atrial fibrillation, coronary disease, hypertension and hyperlipidemia. Patient has had previous PCI of his right coronary artery in the setting of an acute infarct in 2004. No obstructive disease in the left system. He has had atrial fibrillation and has undergone previous ablation. Last echocardiogram in April of 2013 showed normal LV function. Nuclear study in October of 2013 showed an ejection fraction of 50%. There was no ischemia. I last saw him in August 2015. Since that time, he occasionally has brief palpitations.  He denies chest pain, dyspnea, syncope or bleeding.  Current Outpatient Medications  Medication Sig Dispense Refill  . Boron 3 MG CAPS Take by mouth.    . dofetilide (TIKOSYN) 500 MCG capsule Take 1 capsule (500 mcg total) by mouth 2 (two) times daily. 180 capsule 3  . Garlic 259 MG TABS Take by mouth.    . Ginger, Zingiber officinalis, (GINGER PO) Take by mouth.    [name] lisinopril (PRINIVIL,ZESTRIL) 20 MG tablet TAKE 1 TABLET (20 MG TOTAL) BY MOUTH EVERY EVENING. 30 tablet 3  . Magnesium 200 MG TABS Take 1 tablet by mouth daily.    . metFORMIN (GLUCOPHAGE) 850 MG tablet Take 1 tablet (850 mg total) by mouth 2 (two) times daily with a meal. 180 tablet 1  . metoprolol succinate (TOPROL-XL) 25 MG 24 hr tablet Take 1 tablet (25 mg total) by mouth daily. 90 tablet 3  . Multiple Vitamins-Minerals (CENTRUM PO) Take 1 tablet by mouth daily.     . nitroGLYCERIN (NITROSTAT) 0.4 MG SL tablet Place 1 tablet (0.4 mg total) under the tongue every 5 (five) minutes as needed. For chest pain at 3rd dose call 911 25 tablet 0  . Omega-3 Fatty Acids (FISH OIL PO) Take by mouth daily.    [name] PRADAXA 150 MG CAPS capsule TAKE ONE CAPSULE BY MOUTH TWICE A DAY 60 capsule 5  . rosuvastatin (CRESTOR) 40 MG tablet Take 1 tablet (40 mg total) by mouth daily. 90 tablet 1  . tadalafil (CIALIS) 10 MG tablet Take 1 tablet (10 mg total) by mouth daily as needed for erectile dysfunction. 10 tablet 0     No current facility-administered medications for this visit.      Past Medical History:  Diagnosis Date  . Atrial fibrillation ([place])    a. Persistent, s/p afib ablation by JA 08/16/10;  b. s/p failed DCCV 05/2011; c. Chronic Tikosyn & Pradaxa (CHA2DS2VASc = 3-4).  [name] CAD (coronary artery disease)    a. 07/2002 Inf MI s/p BMS -->RCA, otw nonobs dzs;  b. 12/2011 Neg MV.  . Chronic diastolic CHF (congestive heart failure) ([place])   . Diabetes ([place])   . HTN (hypertension)   . Hyperlipidemia   . PVC's (premature ventricular contractions)   . Sleep apnea    followed by Dr Gwenette Greet    Past Surgical History:  Procedure Laterality Date  . atrial ablatio  08/16/10   afib ablation by JA  . CARDIAC CATHETERIZATION  2004    Successful PTCA and stent placement in the mid right   coronary artery with an extra 99% narrowing to 0% with placement of a 3.5 x  20 mm Express 2 stent with improvement of TIMI grade 1 flow to TIMI grade 3.  . CARDIOVERSION  06/15/2011   Procedure: CARDIOVERSION;  Surgeon: Fay Records, MD;  Location: [facility] OR;  Service: Cardiovascular;  Laterality: N/A;  . tonsilectomy      Social History   Socioeconomic History  .  Marital status: Single    Spouse name: Not on file  . Number of children: 0  . Years of education: Not on file  . Highest education level: Not on file  Occupational History  . 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 Financial resource strain: Not on file  . Food insecurity:    Worry: Not on file    Inability: Not on file  . Transportation needs:    Medical: Not on file    Non-medical: Not on file  Tobacco Use  . Smoking status: Never Smoker  . Smokeless tobacco: Never Used  Substance and Sexual Activity  . Alcohol use: Yes    Alcohol/week: 1.0 standard drinks    Types: 1 Cans of beer per week    Comment:  three times a week  . Drug use: No  . Sexual activity: Yes  Lifestyle  . Physical activity:    Days per  week: Not on file    Minutes per session: Not on file  . Stress: Not on file  Relationships  . Social connections:    Talks on phone: Not on file    Gets together: Not on file    Attends religious service: Not on file    Active member of club or organization: Not on file    Attends meetings of clubs or organizations: Not on file    Relationship status: Not on file  . Intimate partner violence:    Fear of current or ex partner: Not on file    Emotionally abused: Not on file    Physically abused: Not on file    Forced sexual activity: Not on file  Other Topics Concern  . Not on file  Social History Narrative   Pt lives in [place] alone.  Single.   He is a Teacher, English as a foreign language for [employer]    Family History  Problem Relation Age of Onset  . Coronary artery disease Mother   . Heart failure Mother   . Heart disease Mother   . Hyperlipidemia Mother   . Hypertension Mother   . Alzheimer's disease Father   . Diabetes Father   . Diabetes Brother   . Heart disease Brother   . Hyperlipidemia Brother   . Hypertension Brother   . Alzheimer's disease Brother   . Hyperlipidemia Brother   . Colon cancer Neg Hx   . Pancreatic cancer Neg Hx   . Rectal cancer Neg Hx   . Stomach cancer Neg Hx     ROS: no fevers or chills, productive cough, hemoptysis, dysphasia, odynophagia, melena, hematochezia, dysuria, hematuria, rash, seizure activity, orthopnea, PND, pedal edema, claudication. Remaining systems are negative.  Physical Exam: Well-developed well-nourished in no acute distress.  Skin is warm and dry.  HEENT is normal.  Neck is supple.  Chest is clear to auscultation with normal expansion.  Cardiovascular exam is regular rate and rhythm.  Abdominal exam nontender or distended. No masses palpated. Extremities show no edema. neuro grossly intact  ECG-normal sinus rhythm at a rate of 68.  No ST changes.  Normal QT interval.  Personally reviewed  A/P  1 paroxysmal  atrial fibrillation-patient is in sinus rhythm today.  Continue Tikosyn at present dose.  Continue Toprol and Pradaxa.  Check hemoglobin, bmet and magnesium.    2 coronary artery disease-patient denies chest pain.  Plan to continue medical therapy.  No aspirin given need for anticoagulation.  Continue statin.  3 hyperlipidemia-continue statin.    4 hypertension-blood pressure is controlled.  Continue present medications and follow.  5 obstructive sleep apnea-patient is compliant with CPAP.  Kirk Ruths, MD

## 2018-03-07 ENCOUNTER — Ambulatory Visit (INDEPENDENT_AMBULATORY_CARE_PROVIDER_SITE_OTHER): Payer: BLUE CROSS/BLUE SHIELD | Admitting: Cardiology

## 2018-03-07 ENCOUNTER — Encounter: Payer: Self-pay | Admitting: Cardiology

## 2018-03-07 VITALS — BP 108/60 | HR 68 | Ht 71.0 in | Wt 204.0 lb

## 2018-03-07 DIAGNOSIS — I480 Paroxysmal atrial fibrillation: Secondary | ICD-10-CM

## 2018-03-07 DIAGNOSIS — I10 Essential (primary) hypertension: Secondary | ICD-10-CM | POA: Diagnosis not present

## 2018-03-07 DIAGNOSIS — I2510 Atherosclerotic heart disease of native coronary artery without angina pectoris: Secondary | ICD-10-CM

## 2018-03-07 DIAGNOSIS — E7800 Pure hypercholesterolemia, unspecified: Secondary | ICD-10-CM | POA: Diagnosis not present

## 2018-03-07 NOTE — Patient Instructions (Signed)
Medication Instructions:  NO CHANGE If you need a refill on your cardiac medications before your next appointment, please call your pharmacy.   Lab work: Your physician recommends that you HAVE LAB WORK TODAY If you have labs (blood work) drawn today and your tests are completely normal, you will receive your results only by: . MyChart Message (if you have MyChart) OR . A paper copy in the mail If you have any lab test that is abnormal or we need to change your treatment, we will call you to review the results.  Follow-Up: At [facility], you and your health needs are our priority.  As part of our continuing mission to provide you with exceptional heart care, we have created designated Provider Care Teams.  These Care Teams include your primary Cardiologist (physician) and Advanced Practice Providers (APPs -  Physician Assistants and Nurse Practitioners) who all work together to provide you with the care you need, when you need it. You will need a follow up appointment in 12 months.  Please call our office 2 months in advance to schedule this appointment.  You may see BRIAN CRENSHAW MD or one of the following Advanced Practice Providers on your designated Care Team:   Luke Kilroy, PA-C Krista Kroeger, PA-C . Callie Goodrich, PA-C     

## 2018-03-08 LAB — CBC
Hematocrit: 44.2 % (ref 37.5–51.0)
Hemoglobin: 15 g/dL (ref 13.0–17.7)
MCH: 29.2 pg (ref 26.6–33.0)
MCHC: 33.9 g/dL (ref 31.5–35.7)
MCV: 86 fL (ref 79–97)
Platelets: 194 10*3/uL (ref 150–450)
RBC: 5.14 x10E6/uL (ref 4.14–5.80)
RDW: 13 % (ref 12.3–15.4)
WBC: 5.8 10*3/uL (ref 3.4–10.8)

## 2018-03-08 LAB — BASIC METABOLIC PANEL
BUN / CREAT RATIO: 13 (ref 9–20)
BUN: 13 mg/dL (ref 6–24)
CO2: 24 mmol/L (ref 20–29)
Calcium: 9.6 mg/dL (ref 8.7–10.2)
Chloride: 100 mmol/L (ref 96–106)
Creatinine, Ser: 0.97 mg/dL (ref 0.76–1.27)
GFR calc Af Amer: 100 mL/min/{1.73_m2} (ref >59)
GFR calc non Af Amer: 86 mL/min/{1.73_m2} (ref >59)
Glucose: 121 mg/dL — ABNORMAL HIGH (ref 65–99)
Potassium: 5 mmol/L (ref 3.5–5.2)
Sodium: 140 mmol/L (ref 134–144)

## 2018-03-08 LAB — MAGNESIUM: Magnesium: 1.9 mg/dL (ref 1.6–2.3)

## 2018-03-13 DIAGNOSIS — G4733 Obstructive sleep apnea (adult) (pediatric): Secondary | ICD-10-CM | POA: Diagnosis not present

## 2018-04-01 ENCOUNTER — Telehealth: Payer: Self-pay | Admitting: *Deleted

## 2018-04-01 ENCOUNTER — Other Ambulatory Visit: Payer: Self-pay | Admitting: Pharmacist

## 2018-04-01 MED ORDER — APIXABAN 5 MG PO TABS: 5.0000 mg | ORAL_TABLET | Freq: Two times a day (BID) | ORAL | 3 refills | Status: DC

## 2018-04-01 MED ORDER — APIXABAN 5 MG PO TABS: 5.0000 mg | ORAL_TABLET | Freq: Two times a day (BID) | ORAL | 1 refills | Status: DC

## 2018-04-01 NOTE — Telephone Encounter (Signed)
Per my chart message, patient switching from pradaxa to eliquis 5 mg twice daily per dr Stanford Breed. New script sent to the pharmacy and savings cards placed at the front desk for patient pick up.

## 2018-04-12 DIAGNOSIS — G4733 Obstructive sleep apnea (adult) (pediatric): Secondary | ICD-10-CM | POA: Diagnosis not present

## 2018-05-12 DIAGNOSIS — G4733 Obstructive sleep apnea (adult) (pediatric): Secondary | ICD-10-CM | POA: Diagnosis not present

## 2018-06-11 DIAGNOSIS — G4733 Obstructive sleep apnea (adult) (pediatric): Secondary | ICD-10-CM | POA: Diagnosis not present

## 2018-06-15 ENCOUNTER — Other Ambulatory Visit: Payer: Self-pay | Admitting: Family Medicine

## 2018-06-15 DIAGNOSIS — E785 Hyperlipidemia, unspecified: Secondary | ICD-10-CM

## 2018-07-09 ENCOUNTER — Other Ambulatory Visit: Payer: Self-pay | Admitting: Family Medicine

## 2018-07-09 DIAGNOSIS — E119 Type 2 diabetes mellitus without complications: Secondary | ICD-10-CM

## 2018-07-11 DIAGNOSIS — G4733 Obstructive sleep apnea (adult) (pediatric): Secondary | ICD-10-CM | POA: Diagnosis not present

## 2018-07-18 ENCOUNTER — Other Ambulatory Visit: Payer: Self-pay | Admitting: Emergency Medicine

## 2018-07-18 DIAGNOSIS — I10 Essential (primary) hypertension: Secondary | ICD-10-CM

## 2018-07-18 DIAGNOSIS — E785 Hyperlipidemia, unspecified: Secondary | ICD-10-CM

## 2018-07-18 DIAGNOSIS — E119 Type 2 diabetes mellitus without complications: Secondary | ICD-10-CM

## 2018-07-22 ENCOUNTER — Other Ambulatory Visit: Payer: Self-pay

## 2018-07-22 ENCOUNTER — Telehealth (INDEPENDENT_AMBULATORY_CARE_PROVIDER_SITE_OTHER): Payer: BLUE CROSS/BLUE SHIELD | Admitting: Family Medicine

## 2018-07-22 DIAGNOSIS — E785 Hyperlipidemia, unspecified: Secondary | ICD-10-CM

## 2018-07-22 DIAGNOSIS — E119 Type 2 diabetes mellitus without complications: Secondary | ICD-10-CM

## 2018-07-22 DIAGNOSIS — I10 Essential (primary) hypertension: Secondary | ICD-10-CM

## 2018-07-22 MED ORDER — ROSUVASTATIN CALCIUM 40 MG PO TABS: 40.0000 mg | ORAL_TABLET | Freq: Every day | ORAL | 1 refills | Status: DC

## 2018-07-22 MED ORDER — METFORMIN HCL 850 MG PO TABS: 850.0000 mg | ORAL_TABLET | Freq: Two times a day (BID) | ORAL | 1 refills | Status: DC

## 2018-07-22 NOTE — Patient Instructions (Addendum)
  No changes in medications at this time.  Depending on hemoglobin A1c will determine follow-up in either 3 or 6 months.  Please let me know if there are questions.  Good talking to you today.  Take care.   If you have lab work done today you will be contacted with your lab results within the next 2 weeks.  If you have not heard from [place] then please contact us. The fastest way to get your results is to register for My Chart.   IF you received an x-ray today, you will receive an invoice from [facility] Radiology. Please contact [facility] Radiology at [phone] with questions or concerns regarding your invoice.   IF you received labwork today, you will receive an invoice from [facility]. Please contact [facility] at [phone] with questions or concerns regarding your invoice.   Our billing staff will not be able to assist you with questions regarding bills from these companies.  You will be contacted with the lab results as soon as they are available. The fastest way to get your results is to activate your My Chart account. Instructions are located on the last page of this paperwork. If you have not heard from [place] regarding the results in 2 weeks, please contact this office.

## 2018-07-22 NOTE — Progress Notes (Signed)
CC-A1c check- Patient is not having any issus at this time. Needed to schedule appt to get further refills on medication. Patient have not check blood sugar in a few days. Patient is scheduled to have labs done her e5/09/2018 @ 8:20.

## 2018-07-22 NOTE — Progress Notes (Signed)
Virtual Visit via Telephone Note  I connected with Scott White on 07/22/18 at 9:24 AM by telephone and verified that I am speaking with the correct person using two identifiers.   I discussed the limitations, risks, security and privacy concerns of performing an evaluation and management service by telephone and the availability of in person appointments. I also discussed with the patient that there may be a patient responsible charge related to this service. The patient expressed understanding and agreed to proceed, consent obtained  Chief complaint:  DM2  History of Present Illness: Scott White is a 58 y.o. male  Diabetes:  Metformin 850mg  BID. No new side effects. Tolerating current doses.  Home readings variable - over 200 at times depending on diet. Tries to keep b/t 150-200.  On ACE-I and statin.  Microalbumin: nl in 10/2017.  Optho, foot exam, pneumovax: up to date.  appt for bloodwork this week.  Diet   Lab Results  Component Value Date   HGBA1C 6.8 (A) 02/21/2018   HGBA1C 6.9 (H) 11/15/2017   HGBA1C 6.6 (H) 03/25/2017   Lab Results  Component Value Date   MICROALBUR <0.2 03/07/2015   LDLCALC 77 11/15/2017   CREATININE 0.97 03/07/2018    Hypertension: BP Readings from Last 3 Encounters:  03/07/18 108/60  02/21/18 123/71  11/15/17 127/78   Lab Results  Component Value Date   CREATININE 0.97 03/07/2018  home readings - 130/90.  Constitutional: Negative for fatigue and unexpected weight change.  Eyes: Negative for visual disturbance.  Respiratory: Negative for cough, chest tightness and shortness of breath.   Cardiovascular: Negative for chest pain, palpitations and leg swelling.  Gastrointestinal: Negative for abdominal pain and blood in stool.  Neurological: Negative for dizziness, light-headedness and headaches.   on lisinopril, toprol.   Atrial fibrillation: followed by cardiology Dr. Stanford Breed, beta-blocker for rate control, Eliquis for  anticoagulation, Tikosyn as antiarrhythmic.  History of CAD, chronic diastolic CHF followed by cardiology. appt in December.  Does use CPAP for OSA.   Hyperlipidemia:  Lab Results  Component Value Date   CHOL 145 11/15/2017   HDL 37 (L) 11/15/2017   LDLCALC 77 11/15/2017   LDLDIRECT 92.8 11/04/2012   TRIG 156 (H) 11/15/2017   CHOLHDL 3.9 11/15/2017   Lab Results  Component Value Date   ALT 23 11/15/2017   AST 22 11/15/2017   ALKPHOS 56 11/15/2017   BILITOT 0.4 11/15/2017   crestor 40mg  qd - no new myalgias/side effects.     Patient Active Problem List   Diagnosis Date Noted  . Complex sleep apnea syndrome 09/22/2010  . Long term (current) use of anticoagulants 07/03/2010  . Long term current use of anticoagulant 05/31/2010  . DYSLIPIDEMIA 09/27/2008  . HYPERTRIGLYCERIDEMIA 02/11/2008  . ESSENTIAL HYPERTENSION, BENIGN 02/11/2008  . CORONARY ATHEROSCLEROSIS NATIVE CORONARY ARTERY 02/11/2008  . ATRIAL FIBRILLATION 02/11/2008   Past Medical History:  Diagnosis Date  . Atrial fibrillation ([place])    a. Persistent, s/p afib ablation by [facility] 08/16/10;  b. s/p failed DCCV 05/2011; c. Chronic Tikosyn & Pradaxa (CHA2DS2VASc = 3-4).  [name] CAD (coronary artery disease)    a. 07/2002 Inf MI s/p BMS -->RCA, otw nonobs dzs;  b. 12/2011 Neg MV.  . Chronic diastolic CHF (congestive heart failure) ([place])   . Diabetes ([place])   . HTN (hypertension)   . Hyperlipidemia   . PVC's (premature ventricular contractions)   . Sleep apnea    followed by Dr Gwenette Greet   Past Surgical  History:  Procedure Laterality Date  . atrial ablatio  08/16/10   afib ablation by [facility]  . CARDIAC CATHETERIZATION  2004    Successful PTCA and stent placement in the mid right   coronary artery with an extra 99% narrowing to 0% with placement of a 3.5 x  20 mm Express 2 stent with improvement of TIMI grade 1 flow to TIMI grade 3.  . CARDIOVERSION  06/15/2011   Procedure: CARDIOVERSION;  Surgeon: Fay Records, MD;  Location: [facility];   Service: Cardiovascular;  Laterality: N/A;  . tonsilectomy     No Known Allergies Prior to Admission medications   Medication Sig Start Date End Date Taking? Authorizing Provider  apixaban (ELIQUIS) 5 MG TABS tablet Take 1 tablet (5 mg total) by mouth 2 (two) times daily. TO REPLACE PRADAXA 04/01/18  Yes Crenshaw, Denice Bors, MD  Boron 3 MG CAPS Take by mouth.   Yes [provider]  dofetilide (TIKOSYN) 500 MCG capsule Take 1 capsule (500 mcg total) by mouth 2 (two) times daily. 02/18/18  Yes Lelon Perla, MD  Garlic 470 MG TABS Take by mouth.   Yes [provider]  Ginger, Zingiber officinalis, (GINGER PO) Take by mouth.   Yes [provider]  lisinopril (PRINIVIL,ZESTRIL) 20 MG tablet TAKE 1 TABLET (20 MG TOTAL) BY MOUTH EVERY EVENING. 02/03/18  Yes Crenshaw, Denice Bors, MD  Magnesium 200 MG TABS Take 1 tablet by mouth daily.   Yes [provider]  metFORMIN (GLUCOPHAGE) 850 MG tablet TAKE 1 TABLET BY MOUTH TWICE A DAY WITH A MEAL 07/09/18  Yes [name], MD  metoprolol succinate (TOPROL-XL) 25 MG 24 hr tablet Take 1 tablet (25 mg total) by mouth daily. 02/18/18  Yes Lelon Perla, MD  Multiple Vitamins-Minerals (CENTRUM PO) Take 1 tablet by mouth daily.    Yes [provider]  nitroGLYCERIN (NITROSTAT) 0.4 MG SL tablet Place 1 tablet (0.4 mg total) under the tongue every 5 (five) minutes as needed. For chest pain at 3rd dose call 911 08/09/16  Yes [name], MD  Omega-3 Fatty Acids (FISH OIL PO) Take by mouth daily.   Yes [provider]  rosuvastatin (CRESTOR) 40 MG tablet TAKE 1 TABLET BY MOUTH EVERY DAY 06/15/18  Yes [name], MD  tadalafil (CIALIS) 10 MG tablet Take 1 tablet (10 mg total) by mouth daily as needed for erectile dysfunction. 03/07/17  Yes Almyra Deforest, PA   Social History   Socioeconomic History  . Marital status: Single    Spouse name: Not on file  . Number of children: 0  . Years of education:  Not on file  . Highest education level: Not on file  Occupational History  . 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 Financial resource strain: Not on file  . Food insecurity:    Worry: Not on file    Inability: Not on file  . Transportation needs:    Medical: Not on file    Non-medical: Not on file  Tobacco Use  . Smoking status: Never Smoker  . Smokeless tobacco: Never Used  Substance and Sexual Activity  . Alcohol use: Yes    Alcohol/week: 1.0 standard drinks    Types: 1 Cans of beer per week    Comment:  three times a week  . Drug use: No  . Sexual activity: Yes  Lifestyle  . Physical activity:  Days per week: Not on file    Minutes per session: Not on file  . Stress: Not on file  Relationships  . Social connections:    Talks on phone: Not on file    Gets together: Not on file    Attends religious service: Not on file    Active member of club or organization: Not on file    Attends meetings of clubs or organizations: Not on file    Relationship status: Not on file  . Intimate partner violence:    Fear of current or ex partner: Not on file    Emotionally abused: Not on file    Physically abused: Not on file    Forced sexual activity: Not on file  Other Topics Concern  . Not on file  Social History Narrative   Pt lives in [place] alone.  Single.   He is a Teacher, English as a foreign language for [employer]     Observations/Objective:   Assessment and Plan: Type 2 diabetes mellitus without complication, without long-term current use of insulin (HCC)  -Tolerating current regimen of metformin 850 mg twice daily.  Continue to work on diet, check A1c to determine changes and follow-up interval  Essential hypertension, benign  -Stable on current regimen, continue Toprol, lisinopril.  Labs pending.  Hyperlipidemia, unspecified hyperlipidemia type  -Tolerating Crestor, continue same.  Labs pending  Follow Up Instructions:  3-6  months depending on A1C    I discussed the assessment and treatment plan with the patient. The patient was provided an opportunity to ask questions and all were answered. The patient agreed with the plan and demonstrated an understanding of the instructions.   The patient was advised to call back or seek an in-person evaluation if the symptoms worsen or if the condition fails to improve as anticipated.  I provided 8 minutes of non-face-to-face time during this encounter.  Signed,   Merri Ray, MD [facility].  07/22/18

## 2018-07-24 ENCOUNTER — Other Ambulatory Visit: Payer: Self-pay

## 2018-07-24 ENCOUNTER — Ambulatory Visit (INDEPENDENT_AMBULATORY_CARE_PROVIDER_SITE_OTHER): Payer: BLUE CROSS/BLUE SHIELD | Admitting: Emergency Medicine

## 2018-07-24 DIAGNOSIS — I10 Essential (primary) hypertension: Secondary | ICD-10-CM | POA: Diagnosis not present

## 2018-07-24 DIAGNOSIS — E785 Hyperlipidemia, unspecified: Secondary | ICD-10-CM | POA: Diagnosis not present

## 2018-07-24 DIAGNOSIS — E119 Type 2 diabetes mellitus without complications: Secondary | ICD-10-CM

## 2018-07-25 LAB — COMPREHENSIVE METABOLIC PANEL
ALT: 19 IU/L (ref 0–44)
AST: 21 IU/L (ref 0–40)
Albumin/Globulin Ratio: 1.8 (ref 1.2–2.2)
Albumin: 4.4 g/dL (ref 3.8–4.9)
Alkaline Phosphatase: 48 IU/L (ref 39–117)
BUN/Creatinine Ratio: 11 (ref 9–20)
BUN: 10 mg/dL (ref 6–24)
Bilirubin Total: 0.4 mg/dL (ref 0.0–1.2)
CO2: 23 mmol/L (ref 20–29)
Calcium: 9.3 mg/dL (ref 8.7–10.2)
Chloride: 100 mmol/L (ref 96–106)
Creatinine, Ser: 0.88 mg/dL (ref 0.76–1.27)
GFR calc Af Amer: 109 mL/min/{1.73_m2} (ref >59)
GFR calc non Af Amer: 95 mL/min/{1.73_m2} (ref >59)
Globulin, Total: 2.5 g/dL (ref 1.5–4.5)
Glucose: 107 mg/dL — ABNORMAL HIGH (ref 65–99)
Potassium: 4.8 mmol/L (ref 3.5–5.2)
Sodium: 138 mmol/L (ref 134–144)
Total Protein: 6.9 g/dL (ref 6.0–8.5)

## 2018-07-25 LAB — LIPID PANEL
Chol/HDL Ratio: 4.4 ratio (ref 0.0–5.0)
Cholesterol, Total: 154 mg/dL (ref 100–199)
HDL: 35 mg/dL — ABNORMAL LOW (ref >39)
LDL Calculated: 78 mg/dL (ref 0–99)
Triglycerides: 205 mg/dL — ABNORMAL HIGH (ref 0–149)
VLDL Cholesterol Cal: 41 mg/dL — ABNORMAL HIGH (ref 5–40)

## 2018-07-25 LAB — HEMOGLOBIN A1C
Est. average glucose Bld gHb Est-mCnc: 140 mg/dL
Hgb A1c MFr Bld: 6.5 % — ABNORMAL HIGH (ref 4.8–5.6)

## 2018-08-12 DIAGNOSIS — G4733 Obstructive sleep apnea (adult) (pediatric): Secondary | ICD-10-CM | POA: Diagnosis not present

## 2018-09-11 DIAGNOSIS — G4733 Obstructive sleep apnea (adult) (pediatric): Secondary | ICD-10-CM | POA: Diagnosis not present

## 2018-10-01 ENCOUNTER — Other Ambulatory Visit: Payer: Self-pay | Admitting: Cardiology

## 2018-10-02 ENCOUNTER — Other Ambulatory Visit: Payer: Self-pay

## 2018-10-02 MED ORDER — LISINOPRIL 20 MG PO TABS: 20.0000 mg | ORAL_TABLET | Freq: Every evening | ORAL | 3 refills | Status: DC

## 2018-10-11 DIAGNOSIS — G4733 Obstructive sleep apnea (adult) (pediatric): Secondary | ICD-10-CM | POA: Diagnosis not present

## 2018-11-03 ENCOUNTER — Other Ambulatory Visit: Payer: Self-pay

## 2018-11-03 MED ORDER — LISINOPRIL 20 MG PO TABS: 20.0000 mg | ORAL_TABLET | Freq: Every evening | ORAL | 1 refills | Status: DC

## 2018-11-07 ENCOUNTER — Other Ambulatory Visit: Payer: Self-pay | Admitting: Pharmacist Clinician (PhC)/ Clinical Pharmacy Specialist

## 2018-11-07 MED ORDER — APIXABAN 5 MG PO TABS: 5.0000 mg | ORAL_TABLET | Freq: Two times a day (BID) | ORAL | 1 refills | Status: DC

## 2018-11-07 NOTE — Telephone Encounter (Signed)
89 M 92.5 kg, SCr 0.88 (07/2018), LOV [facility] 02/2018

## 2018-11-10 DIAGNOSIS — G4733 Obstructive sleep apnea (adult) (pediatric): Secondary | ICD-10-CM | POA: Diagnosis not present

## 2018-11-26 ENCOUNTER — Other Ambulatory Visit: Payer: Self-pay | Admitting: Family Medicine

## 2018-11-26 DIAGNOSIS — E119 Type 2 diabetes mellitus without complications: Secondary | ICD-10-CM

## 2018-12-10 DIAGNOSIS — G4733 Obstructive sleep apnea (adult) (pediatric): Secondary | ICD-10-CM | POA: Diagnosis not present

## 2019-01-07 ENCOUNTER — Telehealth: Payer: Self-pay | Admitting: Family Medicine

## 2019-01-07 NOTE — Telephone Encounter (Signed)
LVM to let pt know I scheduled him a CPE in November. If he does not want it as a physical then we can just make it an OV.

## 2019-01-09 DIAGNOSIS — G4733 Obstructive sleep apnea (adult) (pediatric): Secondary | ICD-10-CM | POA: Diagnosis not present

## 2019-01-19 ENCOUNTER — Other Ambulatory Visit: Payer: Self-pay | Admitting: *Deleted

## 2019-01-19 MED ORDER — APIXABAN 5 MG PO TABS: 5.0000 mg | ORAL_TABLET | Freq: Two times a day (BID) | ORAL | 0 refills | Status: DC

## 2019-02-09 ENCOUNTER — Other Ambulatory Visit: Payer: Self-pay

## 2019-02-09 ENCOUNTER — Ambulatory Visit (INDEPENDENT_AMBULATORY_CARE_PROVIDER_SITE_OTHER): Payer: BC Managed Care – PPO | Admitting: Family Medicine

## 2019-02-09 ENCOUNTER — Encounter: Payer: Self-pay | Admitting: Family Medicine

## 2019-02-09 VITALS — BP 127/77 | HR 63 | Temp 98.5°F | Wt 198.4 lb

## 2019-02-09 DIAGNOSIS — E119 Type 2 diabetes mellitus without complications: Secondary | ICD-10-CM

## 2019-02-09 DIAGNOSIS — G4733 Obstructive sleep apnea (adult) (pediatric): Secondary | ICD-10-CM | POA: Diagnosis not present

## 2019-02-09 DIAGNOSIS — Z0001 Encounter for general adult medical examination with abnormal findings: Secondary | ICD-10-CM | POA: Diagnosis not present

## 2019-02-09 DIAGNOSIS — I10 Essential (primary) hypertension: Secondary | ICD-10-CM | POA: Diagnosis not present

## 2019-02-09 DIAGNOSIS — E785 Hyperlipidemia, unspecified: Secondary | ICD-10-CM

## 2019-02-09 DIAGNOSIS — H35721 Serous detachment of retinal pigment epithelium, right eye: Secondary | ICD-10-CM | POA: Diagnosis not present

## 2019-02-09 DIAGNOSIS — Z23 Encounter for immunization: Secondary | ICD-10-CM

## 2019-02-09 DIAGNOSIS — I4891 Unspecified atrial fibrillation: Secondary | ICD-10-CM

## 2019-02-09 DIAGNOSIS — Z9989 Dependence on other enabling machines and devices: Secondary | ICD-10-CM

## 2019-02-09 DIAGNOSIS — Z0000 Encounter for general adult medical examination without abnormal findings: Secondary | ICD-10-CM

## 2019-02-09 LAB — HM DIABETES EYE EXAM

## 2019-02-09 MED ORDER — ROSUVASTATIN CALCIUM 40 MG PO TABS: 40.0000 mg | ORAL_TABLET | Freq: Every day | ORAL | 1 refills | Status: DC

## 2019-02-09 MED ORDER — SHINGRIX 50 MCG/0.5ML IM SUSR: 0.5000 mL | Freq: Once | INTRAMUSCULAR | 1 refills | Status: DC

## 2019-02-09 MED ORDER — SHINGRIX 50 MCG/0.5ML IM SUSR: 0.5000 mL | Freq: Once | INTRAMUSCULAR | 1 refills | Status: AC

## 2019-02-09 MED ORDER — LISINOPRIL 20 MG PO TABS: 20.0000 mg | ORAL_TABLET | Freq: Every evening | ORAL | 1 refills | Status: DC

## 2019-02-09 MED ORDER — METFORMIN HCL 850 MG PO TABS: 850.0000 mg | ORAL_TABLET | Freq: Two times a day (BID) | ORAL | 2 refills | Status: DC

## 2019-02-09 MED ORDER — METOPROLOL SUCCINATE ER 25 MG PO TB24: 25.0000 mg | ORAL_TABLET | Freq: Every day | ORAL | 1 refills | Status: DC

## 2019-02-09 NOTE — Progress Notes (Signed)
Subjective:  Patient ID: Scott White, male    DOB: 12-Dec-1960  Age: 58 y.o. MRN: [id number]  CC:  Chief Complaint  Patient presents with   Annual Exam    annual exam with not other issues except diabetes check    HPI [name] presents for   Annual exam.  Hypertension: Takes lisinopril 20 mg daily, Toprol 25 mg daily, cardiologist Dr. Stanford Breed. No new side effects.  Home readings: controlled usually on home readings.  BP Readings from Last 3 Encounters:  02/09/19 127/77  03/07/18 108/60  02/21/18 123/71   Lab Results  Component Value Date   CREATININE 0.88 07/24/2018   Hyperlipidemia: Crestor 40 mg daily. No new myalgias.  Lab Results  Component Value Date   CHOL 154 07/24/2018   HDL 35 (L) 07/24/2018   LDLCALC 78 07/24/2018   LDLDIRECT 92.8 11/04/2012   TRIG 205 (H) 07/24/2018   CHOLHDL 4.4 07/24/2018   Lab Results  Component Value Date   ALT 19 07/24/2018   AST 21 07/24/2018   ALKPHOS 48 07/24/2018   BILITOT 0.4 07/24/2018   Atrial fibrillation Followed by Dr. Stanford Breed as above, rate/rhythm control with Tikosyn and Toprol, anticoagulated with Eliquis. Rare palpitations if not getting a good seal on CPAP.  No new bleeding.   Sleep apnea History of complex sleep apnea, has used CPAP.  Pulmonologist Dr. Gwenette Greet in 2016.   Diabetes:  Metformin 850 mg twice daily, no new side effects. Microalbumin: Normal ratio August 2019 Optho, foot exam, pneumovax: Foot exam today, due for ophthalmology exam - this afternoon.   Diabetic Foot Exam - Simple   Simple Foot Form Diabetic Foot exam was performed with the following findings: Yes 02/09/2019  8:10 AM  Visual Inspection Sensation Testing Pulse Check Comments      Lab Results  Component Value Date   HGBA1C 6.5 (H) 07/24/2018   HGBA1C 6.8 (A) 02/21/2018   HGBA1C 6.9 (H) 11/15/2017   Lab Results  Component Value Date   MICROALBUR <0.2 03/07/2015   LDLCALC 78 07/24/2018   CREATININE  0.88 07/24/2018    Cancer screening: Colonoscopy 05/15/2013, repeat 10 years Normal PSA 0.8 in January 2019.  Declines testing this year after R/B discussion.  Has dermatologist - plans to schedule.   Immunization History  Administered Date(s) Administered   Influenza-Unspecified 11/17/2012   Pneumococcal Polysaccharide-23 03/25/2017   Tdap 11/16/2013  flu vaccine 2 weeks ago at work.  Shingles: agrees to rx   Depression screen [facility] 2/9 02/09/2019 07/22/2018 02/21/2018 03/25/2017 11/17/2015  Decreased Interest 0 0 0 0 0  Down, Depressed, Hopeless 0 0 0 0 0  PHQ - 2 Score 0 0 0 0 0    Hearing Screening   125Hz  250Hz  500Hz  1000Hz  2000Hz  3000Hz  4000Hz  6000Hz  8000Hz   Right ear:           Left ear:             Visual Acuity Screening   Right eye Left eye Both eyes  Without correction:     With correction: 20/20 20/15 20/15    Dental every 6 months  Exercise decreased recently, some activity at distance with friend on biofuel project. Not back in the gym at this time.    History Patient Active Problem List   Diagnosis Date Noted   Complex sleep apnea syndrome 09/22/2010   Long term (current) use of anticoagulants 07/03/2010   Long term current use of anticoagulant 05/31/2010   DYSLIPIDEMIA 09/27/2008  HYPERTRIGLYCERIDEMIA 02/11/2008   ESSENTIAL HYPERTENSION, BENIGN 02/11/2008   CORONARY ATHEROSCLEROSIS NATIVE CORONARY ARTERY 02/11/2008   ATRIAL FIBRILLATION 02/11/2008   Past Medical History:  Diagnosis Date   Atrial fibrillation ([place])    a. Persistent, s/p afib ablation by [facility] 08/16/10;  b. s/p failed DCCV 05/2011; c. Chronic Tikosyn & Pradaxa (CHA2DS2VASc = 3-4).   CAD (coronary artery disease)    a. 07/2002 Inf MI s/p BMS -->RCA, otw nonobs dzs;  b. 12/2011 Neg MV.   Chronic diastolic CHF (congestive heart failure) (HCC)    Diabetes (HCC)    HTN (hypertension)    Hyperlipidemia    PVC's (premature ventricular contractions)    Sleep apnea    followed by  Dr Gwenette Greet   Past Surgical History:  Procedure Laterality Date   atrial ablatio  08/16/10   afib ablation by [place]  2004    Successful PTCA and stent placement in the mid right   coronary artery with an extra 99% narrowing to 0% with placement of a 3.5 x  20 mm Express 2 stent with improvement of TIMI grade 1 flow to TIMI grade 3.   CARDIOVERSION  06/15/2011   Procedure: CARDIOVERSION;  Surgeon: Fay Records, MD;  Location: [facility] OR;  Service: Cardiovascular;  Laterality: N/A;   tonsilectomy     No Known Allergies Prior to Admission medications   Medication Sig Start Date End Date Taking? Authorizing Provider  apixaban (ELIQUIS) 5 MG TABS tablet Take 1 tablet (5 mg total) by mouth 2 (two) times daily. 01/19/19  Yes Lelon Perla, MD  Boron 3 MG CAPS Take by mouth.   Yes [provider]  dofetilide (TIKOSYN) 500 MCG capsule Take 1 capsule (500 mcg total) by mouth 2 (two) times daily. 02/18/18  Yes Lelon Perla, MD  Garlic [id number] MG TABS Take by mouth.   Yes [provider]  Ginger, Zingiber officinalis, (GINGER PO) Take by mouth.   Yes [provider]  lisinopril (ZESTRIL) 20 MG tablet Take 1 tablet (20 mg total) by mouth every evening. 11/03/18  Yes Lelon Perla, MD  Magnesium 200 MG TABS Take 1 tablet by mouth daily.   Yes [provider]  metFORMIN (GLUCOPHAGE) 850 MG tablet TAKE 1 TABLET (850 MG TOTAL) BY MOUTH 2 (TWO) TIMES DAILY WITH A MEAL. 11/26/18  Yes [name], MD  metoprolol succinate (TOPROL-XL) 25 MG 24 hr tablet Take 1 tablet (25 mg total) by mouth daily. 02/18/18  Yes Lelon Perla, MD  Multiple Vitamins-Minerals (CENTRUM PO) Take 1 tablet by mouth daily.    Yes [provider]  nitroGLYCERIN (NITROSTAT) 0.4 MG SL tablet Place 1 tablet (0.4 mg total) under the tongue every 5 (five) minutes as needed. For chest pain at 3rd dose call 911 08/09/16  Yes [name], MD  Omega-3 Fatty Acids  (FISH OIL PO) Take by mouth daily.   Yes [provider]  rosuvastatin (CRESTOR) 40 MG tablet Take 1 tablet (40 mg total) by mouth daily. 07/22/18  Yes [name], MD  tadalafil (CIALIS) 10 MG tablet Take 1 tablet (10 mg total) by mouth daily as needed for erectile dysfunction. 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[employer]  Social Needs   Financial resource strain: Not on file   Food insecurity    Worry: Not on file    Inability: Not on file   Transportation needs    Medical: Not on file    Non-medical: Not on file  Tobacco Use   Smoking status: Never Smoker   Smokeless tobacco: Never Used  Substance and Sexual Activity   Alcohol use: Yes    Alcohol/week: 1.0 standard drinks    Types: 1 Cans of beer per week    Comment:  three times a week   Drug use: No   Sexual activity: Yes  Lifestyle   Physical activity    Days per week: Not on file    Minutes per session: Not on file   Stress: Not on file  Relationships   Social connections    Talks on phone: Not on file    Gets together: Not on file    Attends religious service: Not on file    Active member of club or organization: Not on file    Attends meetings of clubs or organizations: Not on file    Relationship status: Not on file   Intimate partner violence    Fear of current or ex partner: Not on file    Emotionally abused: Not on file    Physically abused: Not on file    Forced sexual activity: Not on file  Other Topics Concern   Not on file  Social History Narrative   Pt lives in [place] alone.  Single.   He is a Teacher, English as a foreign language for [employer]    Review of Systems   Objective:   Vitals:   02/09/19 0807    BP: 127/77  Pulse: 63  Temp: 98.5 F (36.9 C)  TempSrc: Oral  SpO2: 98%  Weight: 198 lb 6.4 oz (90 kg)     Physical Exam Vitals signs reviewed.  Constitutional:      Appearance: He is well-developed.  HENT:     Head: Normocephalic and atraumatic.     Right Ear: External ear normal.     Left Ear: External ear normal.  Eyes:     Conjunctiva/sclera: Conjunctivae normal.     Pupils: Pupils are equal, round, and reactive to light.  Neck:     Musculoskeletal: Normal range of motion and neck supple.     Thyroid: No thyromegaly.  Cardiovascular:     Rate and Rhythm: Normal rate and regular rhythm.     Heart sounds: Normal heart sounds.  Pulmonary:     Effort: Pulmonary effort is normal. No respiratory distress.     Breath sounds: Normal breath sounds. No wheezing.  Abdominal:     General: There is no distension.     Palpations: Abdomen is soft.     Tenderness: There is no abdominal tenderness.  Musculoskeletal: Normal range of motion.        General: No tenderness.  Lymphadenopathy:     Cervical: No cervical adenopathy.  Skin:    General: Skin is warm and dry.  Neurological:     Mental Status: He is alert and oriented to person, place, and time.     Deep Tendon Reflexes: Reflexes are normal and symmetric.  Psychiatric:        Behavior: Behavior normal.        Assessment & Plan:  Jahel Leleux Yarbough is a 58  y.o. male . Annual physical exam  - -anticipatory guidance as below in AVS, screening labs above. Health maintenance items as above in HPI discussed/recommended as applicable.   Need for shingles vaccine - Plan: Zoster Vaccine Adjuvanted [facility]) injection sent to pharmacy  Type 2 diabetes mellitus without complication, without long-term current use of insulin ([place]) - Plan: Hemoglobin A1c, Microalbumin / creatinine urine ratio, metFORMIN (GLUCOPHAGE) 850 MG tablet  -Previously improving control, tolerating Metformin. Check labs above. Increased low intensity  exercise discussed.  Essential hypertension - Plan: lisinopril (ZESTRIL) 20 MG tablet, metoprolol succinate (TOPROL-XL) 25 MG 24 hr tablet  -Stable, continue same regimen  Hyperlipidemia, unspecified hyperlipidemia type - Plan: Lipid Panel, Comprehensive metabolic panel, rosuvastatin (CRESTOR) 40 MG tablet  -  Stable, tolerating current regimen. Medications refilled. Labs pending as above.   Atrial fibrillation, unspecified type ([place])  -Rare palpitation, associated with CPAP fit. Has necessary equipment at home. Continue same regimen, follow-up with cardiology.  OSA on CPAP  -Compliant with CPAP. Does have occasional fit issues which he is correcting  No orders of the defined types were placed in this encounter.  Patient Instructions       If you have lab work done today you will be contacted with your lab results within the next 2 weeks.  If you have not heard from [place] then please contact us. The fastest way to get your results is to register for My Chart.   IF you received an x-ray today, you will receive an invoice from [facility]. Please contact [facility] at [phone] with questions or concerns regarding your invoice.   IF you received labwork today, you will receive an invoice from [facility]. Please contact [facility] at [phone] with questions or concerns regarding your invoice.   Our billing staff will not be able to assist you with questions regarding bills from these companies.  You will be contacted with the lab results as soon as they are available. The fastest way to get your results is to activate your My Chart account. Instructions are located on the last page of this paperwork. If you have not heard from [place] regarding the results in 2 weeks, please contact this office.          Signed, Merri Ray, MD [facility]

## 2019-02-09 NOTE — Patient Instructions (Addendum)
Contact cardiology if needed for refills of some of your medications for atrial fibrillation. I did refill most of your medicines today. No changes for now. Shingles vaccine sent to your pharmacy. Try incorporating some walking or low intensity activity most days per week for a total of 150 minutes/week. Walking/activity at work can also count. I will let you know the results of your labs when I receive them. Thanks for coming in today and stay safe.  Keeping you healthy  Get these tests  Blood pressure- Have your blood pressure checked once a year by your healthcare provider.  Normal blood pressure is 120/80  Weight- Have your body mass index (BMI) calculated to screen for obesity.  BMI is a measure of body fat based on height and weight. You can also calculate your own BMI at ViewBanking.si.  Cholesterol- Have your cholesterol checked every year.  Diabetes- Have your blood sugar checked regularly if you have high blood pressure, high cholesterol, have a family history of diabetes or if you are overweight.  Screening for Colon Cancer- Colonoscopy starting at age 42.  Screening may begin sooner depending on your family history and other health conditions. Follow up colonoscopy as directed by your Gastroenterologist.  Screening for Prostate Cancer- Both blood work (PSA) and a rectal exam help screen for Prostate Cancer.  Screening begins at age 46 with African-American men and at age 65 with Caucasian men.  Screening may begin sooner depending on your family history.  Take these medicines  Aspirin- One aspirin daily can help prevent Heart disease and Stroke.  Flu shot- Every fall.  Tetanus- Every 10 years.  Zostavax- Once after the age of 7 to prevent Shingles.  Pneumonia shot- Once after the age of 76; if you are younger than 39, ask your healthcare provider if you need a Pneumonia shot.  Take these steps  Don't smoke- If you do smoke, talk to your doctor about  quitting.  For tips on how to quit, go to www.smokefree.gov or call 1-800-QUIT-NOW.  Be physically active- Exercise 5 days a week for at least 30 minutes.  If you are not already physically active start slow and gradually work up to 30 minutes of moderate physical activity.  Examples of moderate activity include walking briskly, mowing the yard, dancing, swimming, bicycling, etc.  Eat a healthy diet- Eat a variety of healthy food such as fruits, vegetables, low fat milk, low fat cheese, yogurt, lean meant, poultry, fish, beans, tofu, etc. For more information go to www.thenutritionsource.org  Drink alcohol in moderation- Limit alcohol intake to less than two drinks a day. Never drink and drive.  Dentist- Brush and floss twice daily; visit your dentist twice a year.  Depression- Your emotional health is as important as your physical health. If you're feeling down, or losing interest in things you would normally enjoy please talk to your healthcare provider.  Eye exam- Visit your eye doctor every year.  Safe sex- If you may be exposed to a sexually transmitted infection, use a condom.  Seat belts- Seat belts can save your life; always wear one.  Smoke/Carbon Monoxide detectors- These detectors need to be installed on the appropriate level of your home.  Replace batteries at least once a year.  Skin cancer- When out in the sun, cover up and use sunscreen 15 SPF or higher.  Violence- If anyone is threatening you, please tell your healthcare provider.  Living Will/ Health care power of attorney- Speak with your healthcare provider and  family.   If you have lab work done today you will be contacted with your lab results within the next 2 weeks.  If you have not heard from [place] then please contact us. The fastest way to get your results is to register for My Chart.   IF you received an x-ray today, you will receive an invoice from [facility] Radiology. Please contact [facility] at  [phone] with questions or concerns regarding your invoice.   IF you received labwork today, you will receive an invoice from [place]. Please contact [facility] at [phone] with questions or concerns regarding your invoice.   Our billing staff will not be able to assist you with questions regarding bills from these companies.  You will be contacted with the lab results as soon as they are available. The fastest way to get your results is to activate your My Chart account. Instructions are located on the last page of this paperwork. If you have not heard from [place] regarding the results in 2 weeks, please contact this office.

## 2019-02-09 NOTE — Addendum Note (Signed)
Addended by: Merri Ray R on: 02/09/2019 08:54 AM   Modules accepted: Orders

## 2019-02-10 LAB — COMPREHENSIVE METABOLIC PANEL
ALT: 25 IU/L (ref 0–44)
AST: 22 IU/L (ref 0–40)
Albumin/Globulin Ratio: 1.8 (ref 1.2–2.2)
Albumin: 4.6 g/dL (ref 3.8–4.9)
Alkaline Phosphatase: 61 IU/L (ref 39–117)
BUN/Creatinine Ratio: 12 (ref 9–20)
BUN: 9 mg/dL (ref 6–24)
Bilirubin Total: 0.5 mg/dL (ref 0.0–1.2)
CO2: 21 mmol/L (ref 20–29)
Calcium: 9.6 mg/dL (ref 8.7–10.2)
Chloride: 101 mmol/L (ref 96–106)
Creatinine, Ser: 0.76 mg/dL (ref 0.76–1.27)
GFR calc Af Amer: 116 mL/min/{1.73_m2} (ref >59)
GFR calc non Af Amer: 101 mL/min/{1.73_m2} (ref >59)
Globulin, Total: 2.5 g/dL (ref 1.5–4.5)
Glucose: 122 mg/dL — ABNORMAL HIGH (ref 65–99)
Potassium: 4.7 mmol/L (ref 3.5–5.2)
Sodium: 141 mmol/L (ref 134–144)
Total Protein: 7.1 g/dL (ref 6.0–8.5)

## 2019-02-10 LAB — MICROALBUMIN / CREATININE URINE RATIO
Creatinine, Urine: 70.9 mg/dL
Microalb/Creat Ratio: <4 mg/g creat (ref 0–29)
Microalbumin, Urine: <3 ug/mL

## 2019-02-10 LAB — LIPID PANEL
Chol/HDL Ratio: 4.2 ratio (ref 0.0–5.0)
Cholesterol, Total: 159 mg/dL (ref 100–199)
HDL: 38 mg/dL — ABNORMAL LOW (ref >39)
LDL Chol Calc (NIH): 86 mg/dL (ref 0–99)
Triglycerides: 206 mg/dL — ABNORMAL HIGH (ref 0–149)
VLDL Cholesterol Cal: 35 mg/dL (ref 5–40)

## 2019-02-10 LAB — HEMOGLOBIN A1C
Est. average glucose Bld gHb Est-mCnc: 143 mg/dL
Hgb A1c MFr Bld: 6.6 % — ABNORMAL HIGH (ref 4.8–5.6)

## 2019-02-13 ENCOUNTER — Other Ambulatory Visit: Payer: Self-pay | Admitting: Cardiology

## 2019-02-18 ENCOUNTER — Encounter: Payer: Self-pay | Admitting: Family Medicine

## 2019-03-05 ENCOUNTER — Other Ambulatory Visit: Payer: Self-pay | Admitting: Family Medicine

## 2019-03-05 DIAGNOSIS — E785 Hyperlipidemia, unspecified: Secondary | ICD-10-CM

## 2019-03-05 NOTE — Telephone Encounter (Signed)
Forwarding medication refill request to the clinical pool for review. 

## 2019-03-11 DIAGNOSIS — D485 Neoplasm of uncertain behavior of skin: Secondary | ICD-10-CM | POA: Diagnosis not present

## 2019-03-11 DIAGNOSIS — L821 Other seborrheic keratosis: Secondary | ICD-10-CM | POA: Diagnosis not present

## 2019-03-11 DIAGNOSIS — D229 Melanocytic nevi, unspecified: Secondary | ICD-10-CM | POA: Diagnosis not present

## 2019-03-11 DIAGNOSIS — D034 Melanoma in situ of scalp and neck: Secondary | ICD-10-CM | POA: Diagnosis not present

## 2019-03-11 DIAGNOSIS — L810 Postinflammatory hyperpigmentation: Secondary | ICD-10-CM | POA: Diagnosis not present

## 2019-03-11 DIAGNOSIS — L570 Actinic keratosis: Secondary | ICD-10-CM | POA: Diagnosis not present

## 2019-03-11 DIAGNOSIS — D225 Melanocytic nevi of trunk: Secondary | ICD-10-CM | POA: Diagnosis not present

## 2019-03-11 DIAGNOSIS — G4733 Obstructive sleep apnea (adult) (pediatric): Secondary | ICD-10-CM | POA: Diagnosis not present

## 2019-03-22 ENCOUNTER — Encounter: Payer: Self-pay | Admitting: Family Medicine

## 2019-03-27 NOTE — Progress Notes (Signed)
HPI: FU atrial fibrillation, coronary disease, hypertension and hyperlipidemia. Patient has had previous PCI of his right coronary artery in the setting of an acute infarct in 2004. No obstructive disease in the left system. He has had atrial fibrillation and has undergone previous ablation. Last echocardiogram in April of 2013 showed normal LV function. Nuclear study in October of 2013 showed an ejection fraction of 50%. There was no ischemia. Since last seen, he notes some increased sleeping but denies dyspnea, chest pain, palpitations, syncope or bleeding.  Current Outpatient Medications  Medication Sig Dispense Refill  . apixaban (ELIQUIS) 5 MG TABS tablet Take 1 tablet (5 mg total) by mouth 2 (two) times daily. 180 tablet 0  . Boron 3 MG CAPS Take by mouth.    . dofetilide (TIKOSYN) 500 MCG capsule TAKE 1 CAPSULE TWICE A DAY 180 capsule 0  . Garlic [id number] MG TABS Take by mouth.    . Ginger, Zingiber officinalis, (GINGER PO) Take by mouth.    [name] lisinopril (ZESTRIL) 20 MG tablet Take 1 tablet (20 mg total) by mouth every evening. 90 tablet 1  . Magnesium 200 MG TABS Take 1 tablet by mouth daily.    . metFORMIN (GLUCOPHAGE) 850 MG tablet Take 1 tablet (850 mg total) by mouth 2 (two) times daily with a meal. 180 tablet 2  . metoprolol succinate (TOPROL-XL) 25 MG 24 hr tablet Take 1 tablet (25 mg total) by mouth daily. 90 tablet 1  . Multiple Vitamins-Minerals (CENTRUM PO) Take 1 tablet by mouth daily.     . nitroGLYCERIN (NITROSTAT) 0.4 MG SL tablet Place 1 tablet (0.4 mg total) under the tongue every 5 (five) minutes as needed. For chest pain at 3rd dose call 911 25 tablet 0  . Omega-3 Fatty Acids (FISH OIL PO) Take by mouth daily.    . rosuvastatin (CRESTOR) 40 MG tablet TAKE 1 TABLET BY MOUTH EVERY DAY 30 tablet 5  . tadalafil (CIALIS) 10 MG tablet Take 1 tablet (10 mg total) by mouth daily as needed for erectile dysfunction. 10 tablet 0   No current facility-administered medications  for this visit.     Past Medical History:  Diagnosis Date  . Atrial fibrillation ([place])    a. Persistent, s/p afib ablation by JA 08/16/10;  b. s/p failed DCCV 05/2011; c. Chronic Tikosyn & Pradaxa (CHA2DS2VASc = 3-4).  [name] CAD (coronary artery disease)    a. 07/2002 Inf MI s/p BMS -->RCA, otw nonobs dzs;  b. 12/2011 Neg MV.  . Chronic diastolic CHF (congestive heart failure) ([place])   . Diabetes ([place])   . HTN (hypertension)   . Hyperlipidemia   . PVC's (premature ventricular contractions)   . Sleep apnea    followed by Dr Gwenette Greet    Past Surgical History:  Procedure Laterality Date  . atrial ablatio  08/16/10   afib ablation by JA  . CARDIAC CATHETERIZATION  2004    Successful PTCA and stent placement in the mid right   coronary artery with an extra 99% narrowing to 0% with placement of a 3.5 x  20 mm Express 2 stent with improvement of TIMI grade 1 flow to TIMI grade 3.  . CARDIOVERSION  06/15/2011   Procedure: CARDIOVERSION;  Surgeon: Fay Records, MD;  Location: [facility] OR;  Service: Cardiovascular;  Laterality: N/A;  . tonsilectomy      Social History   Socioeconomic History  . Marital status: Single    Spouse name: Not on  file  . Number of children: 0  . Years of education: Not on file  . Highest education level: Not on file  Occupational History  . 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 Smoking status: Never Smoker  . Smokeless tobacco: Never Used  Substance and Sexual Activity  . Alcohol use: Yes    Alcohol/week: 1.0 standard drinks    Types: 1 Cans of beer per week    Comment:  three times a week  . Drug use: No  . Sexual activity: Yes  Other Topics Concern  . Not on file  Social History Narrative   Pt lives in [place] alone.  Single.   He is a Teacher, English as a foreign language for [employer] Strain:   . Difficulty of Paying Living Expenses: Not on file  Food Insecurity:   .  Worried About Charity fundraiser in the Last Year: Not on file  . Ran Out of Food in the Last Year: Not on file  Transportation Needs:   . Lack of Transportation (Medical): Not on file  . Lack of Transportation (Non-Medical): Not on file  Physical Activity:   . Days of Exercise per Week: Not on file  . Minutes of Exercise per Session: Not on file  Stress:   . Feeling of Stress : Not on file  Social Connections:   . Frequency of Communication with Friends and Family: Not on file  . Frequency of Social Gatherings with Friends and Family: Not on file  . Attends Religious Services: Not on file  . Active Member of Clubs or Organizations: Not on file  . Attends Archivist Meetings: Not on file  . Marital Status: Not on file  Intimate Partner Violence:   . Fear of Current or Ex-Partner: Not on file  . Emotionally Abused: Not on file  . Physically Abused: Not on file  . Sexually Abused: Not on file    Family History  Problem Relation Age of Onset  . Coronary artery disease Mother   . Heart failure Mother   . Heart disease Mother   . Hyperlipidemia Mother   . Hypertension Mother   . Alzheimer's disease Father   . Diabetes Father   . Diabetes Brother   . Heart disease Brother   . Hyperlipidemia Brother   . Hypertension Brother   . Alzheimer's disease Brother   . Hyperlipidemia Brother   . Colon cancer Neg Hx   . Pancreatic cancer Neg Hx   . Rectal cancer Neg Hx   . Stomach cancer Neg Hx     ROS: no fevers or chills, productive cough, hemoptysis, dysphasia, odynophagia, melena, hematochezia, dysuria, hematuria, rash, seizure activity, orthopnea, PND, pedal edema, claudication. Remaining systems are negative.  Physical Exam: Well-developed well-nourished in no acute distress.  Skin is warm and dry.  HEENT is normal.  Neck is supple.  Chest is clear to auscultation with normal expansion.  Cardiovascular exam is irregular Abdominal exam nontender or distended. No  masses palpated. Extremities show no edema. neuro grossly intact  ECG-atrial fibrillation at a rate of 75.  Occasional PVC or aberrantly conducted beats, no ST changes.  Personally reviewed  A/P  1 paroxysmal atrial fibrillation-patient in atrial fibrillation this AM.  Continue Tikosyn, Toprol and apixaban.  We discussed the options today of rate control versus rhythm control.  He feels as though he may have decreased  energy.  We will therefore arrange elective cardioversion.  He has been compliant with apixaban and missed no doses.  Repeat echocardiogram.  Check TSH.  2 coronary artery disease-no chest pain.  Continue statin.  He has not on aspirin given need for Pradaxa.  3 hypertension-blood pressure controlled today.  Continue present medications and follow.  4 hyperlipidemia-continue statin.  Most recent LDL 86.  Add Zetia 10 mg daily.  Check lipids and liver in 12 weeks.  5 obstructive sleep apnea-we discussed the importance of compliance with CPAP.  Kirk Ruths, MD

## 2019-04-02 ENCOUNTER — Ambulatory Visit (HOSPITAL_COMMUNITY): Payer: BC Managed Care – PPO | Attending: Cardiology

## 2019-04-02 ENCOUNTER — Ambulatory Visit (INDEPENDENT_AMBULATORY_CARE_PROVIDER_SITE_OTHER): Payer: BC Managed Care – PPO | Admitting: Cardiology

## 2019-04-02 ENCOUNTER — Other Ambulatory Visit: Payer: Self-pay | Admitting: *Deleted

## 2019-04-02 ENCOUNTER — Encounter: Payer: Self-pay | Admitting: Cardiology

## 2019-04-02 ENCOUNTER — Other Ambulatory Visit: Payer: Self-pay

## 2019-04-02 VITALS — BP 110/74 | HR 75 | Temp 94.9°F | Ht 71.0 in | Wt 205.0 lb

## 2019-04-02 DIAGNOSIS — I480 Paroxysmal atrial fibrillation: Secondary | ICD-10-CM | POA: Diagnosis not present

## 2019-04-02 DIAGNOSIS — E7800 Pure hypercholesterolemia, unspecified: Secondary | ICD-10-CM

## 2019-04-02 DIAGNOSIS — I2510 Atherosclerotic heart disease of native coronary artery without angina pectoris: Secondary | ICD-10-CM

## 2019-04-02 DIAGNOSIS — I10 Essential (primary) hypertension: Secondary | ICD-10-CM | POA: Diagnosis not present

## 2019-04-02 LAB — ECHOCARDIOGRAM COMPLETE
Height: 71 in
Weight: 3280 oz

## 2019-04-02 MED ORDER — EZETIMIBE 10 MG PO TABS: 10.0000 mg | ORAL_TABLET | Freq: Every day | ORAL | 3 refills | Status: DC

## 2019-04-02 NOTE — Patient Instructions (Addendum)
You are scheduled for a Cardioversion on Tuesday 04/07/19 with Dr. Margaretann Loveless.  Please arrive at the [facility] (Main Entrance A) at [facility]: [address] [place], [place] [address] at 7:30 am. (1 hour prior to procedure unless lab work is needed; if lab work is needed arrive 1.5 hours ahead)  DIET: Nothing to eat or drink after midnight except a sip of water with medications (see medication instructions below)  Medication Instructions: DO NOT TAKE METOPROLOL Tuesday MORNING  Continue your anticoagulant: ELIQUIS  Labs:  Your physician recommends that you HAVE LAB WORK TODAY  GO TO [place] 04/03/19 @ 8:45 AM FOR COVID TESTING-PRE-PROCEDURE LINE  You must have a responsible person to drive you home and stay in the waiting area during your procedure. Failure to do so could result in cancellation.  Bring your insurance cards.  *Special Note: Every effort is made to have your procedure done on time. Occasionally there are emergencies that occur at the hospital that may cause delays. Please be patient if a delay does occur.   Medication Instructions:  START EZETIMIBE 10 MG ONCE DAILY  *If you need a refill on your cardiac medications before your next appointment, please call your pharmacy*  Lab Work: Your physician recommends that you return for lab work in: [place]  If you have labs (blood work) drawn today and your tests are completely normal, you will receive your results only by: [name] MyChart Message (if you have MyChart) OR . A paper copy in the mail If you have any lab test that is abnormal or we need to change your treatment, we will call you to review the results.  Testing/Procedures: Your physician has requested that you have an echocardiogram. Echocardiography is a painless test that uses sound waves to create images of your heart. It provides your doctor with information about the size and shape of your heart and how well your heart's chambers  and valves are working. This procedure takes approximately one hour. There are no restrictions for this procedure.Media    Follow-Up: At [facility], you and your health needs are our priority.  As part of our continuing mission to provide you with exceptional heart care, we have created designated Provider Care Teams.  These Care Teams include your primary Cardiologist (physician) and Advanced Practice Providers (APPs -  Physician Assistants and Nurse Practitioners) who all work together to provide you with the care you need, when you need it.  Your next appointment:   8-12 week(s)  The format for your next appointment:   In Person  Provider:   Kirk Ruths, MD

## 2019-04-03 ENCOUNTER — Other Ambulatory Visit (HOSPITAL_COMMUNITY)
Admission: RE | Admit: 2019-04-03 | Discharge: 2019-04-03 | Disposition: A | Discharge status: Home / Self Care | Payer: BC Managed Care – PPO | Source: Ambulatory Visit | Attending: Internal Medicine | Admitting: Internal Medicine

## 2019-04-03 DIAGNOSIS — Z20822 Contact with and (suspected) exposure to covid-19: Secondary | ICD-10-CM | POA: Diagnosis not present

## 2019-04-03 DIAGNOSIS — Z01812 Encounter for preprocedural laboratory examination: Secondary | ICD-10-CM | POA: Insufficient documentation

## 2019-04-03 LAB — BASIC METABOLIC PANEL
BUN/Creatinine Ratio: 13 (ref 9–20)
BUN: 11 mg/dL (ref 6–24)
CO2: 23 mmol/L (ref 20–29)
Calcium: 9.6 mg/dL (ref 8.7–10.2)
Chloride: 104 mmol/L (ref 96–106)
Creatinine, Ser: 0.88 mg/dL (ref 0.76–1.27)
GFR calc Af Amer: 109 mL/min/{1.73_m2} (ref >59)
GFR calc non Af Amer: 95 mL/min/{1.73_m2} (ref >59)
Glucose: 124 mg/dL — ABNORMAL HIGH (ref 65–99)
Potassium: 4.8 mmol/L (ref 3.5–5.2)
Sodium: 142 mmol/L (ref 134–144)

## 2019-04-03 LAB — CBC
Hematocrit: 47.2 % (ref 37.5–51.0)
Hemoglobin: 15.9 g/dL (ref 13.0–17.7)
MCH: 29.8 pg (ref 26.6–33.0)
MCHC: 33.7 g/dL (ref 31.5–35.7)
MCV: 89 fL (ref 79–97)
Platelets: 203 10*3/uL (ref 150–450)
RBC: 5.33 x10E6/uL (ref 4.14–5.80)
RDW: 13.1 % (ref 11.6–15.4)
WBC: 6.8 10*3/uL (ref 3.4–10.8)

## 2019-04-03 LAB — MAGNESIUM: Magnesium: 1.9 mg/dL (ref 1.6–2.3)

## 2019-04-03 LAB — TSH: TSH: 2.26 u[IU]/mL (ref 0.450–4.500)

## 2019-04-04 LAB — NOVEL CORONAVIRUS, NAA (HOSP ORDER, SEND-OUT TO REF LAB; TAT 18-24 HRS): SARS-CoV-2, NAA: NOT DETECTED

## 2019-04-06 NOTE — Progress Notes (Signed)
Pre op call done for procedure tomorrow. Patient states they have been quarantined since COVID test. He said he has not missed any doses of blood thinner and will take in morning before procedure. Patient to arrive by 0730 am 04/07/19.

## 2019-04-07 ENCOUNTER — Other Ambulatory Visit: Payer: Self-pay

## 2019-04-07 ENCOUNTER — Encounter (HOSPITAL_COMMUNITY)
Admission: RE | Disposition: A | Discharge status: Home / Self Care | Payer: Self-pay | Source: Home / Self Care | Attending: Internal Medicine

## 2019-04-07 ENCOUNTER — Ambulatory Visit (HOSPITAL_COMMUNITY)
Admission: RE | Admit: 2019-04-07 | Discharge: 2019-04-07 | Disposition: A | Discharge status: Home / Self Care | Payer: BC Managed Care – PPO | Attending: Internal Medicine | Admitting: Internal Medicine

## 2019-04-07 DIAGNOSIS — Z5309 Procedure and treatment not carried out because of other contraindication: Secondary | ICD-10-CM | POA: Insufficient documentation

## 2019-04-07 DIAGNOSIS — I110 Hypertensive heart disease with heart failure: Secondary | ICD-10-CM | POA: Diagnosis not present

## 2019-04-07 DIAGNOSIS — I4819 Other persistent atrial fibrillation: Secondary | ICD-10-CM | POA: Insufficient documentation

## 2019-04-07 DIAGNOSIS — I5032 Chronic diastolic (congestive) heart failure: Secondary | ICD-10-CM | POA: Diagnosis not present

## 2019-04-07 DIAGNOSIS — Z79899 Other long term (current) drug therapy: Secondary | ICD-10-CM | POA: Diagnosis not present

## 2019-04-07 DIAGNOSIS — Z20822 Contact with and (suspected) exposure to covid-19: Secondary | ICD-10-CM | POA: Insufficient documentation

## 2019-04-07 DIAGNOSIS — I2510 Atherosclerotic heart disease of native coronary artery without angina pectoris: Secondary | ICD-10-CM | POA: Diagnosis not present

## 2019-04-07 DIAGNOSIS — G4730 Sleep apnea, unspecified: Secondary | ICD-10-CM | POA: Insufficient documentation

## 2019-04-07 DIAGNOSIS — Z7984 Long term (current) use of oral hypoglycemic drugs: Secondary | ICD-10-CM | POA: Insufficient documentation

## 2019-04-07 DIAGNOSIS — E119 Type 2 diabetes mellitus without complications: Secondary | ICD-10-CM | POA: Diagnosis not present

## 2019-04-07 DIAGNOSIS — I493 Ventricular premature depolarization: Secondary | ICD-10-CM | POA: Insufficient documentation

## 2019-04-07 DIAGNOSIS — I252 Old myocardial infarction: Secondary | ICD-10-CM | POA: Diagnosis not present

## 2019-04-07 DIAGNOSIS — E7849 Other hyperlipidemia: Secondary | ICD-10-CM | POA: Insufficient documentation

## 2019-04-07 DIAGNOSIS — I4891 Unspecified atrial fibrillation: Secondary | ICD-10-CM | POA: Diagnosis not present

## 2019-04-07 SURGERY — CANCELLED PROCEDURE

## 2019-04-07 NOTE — Progress Notes (Signed)
Pt in SR on arrival for cardioversion.  Verified with EKG.  Dr. Margaretann Loveless aware, pt discharged home.  Vista Lawman, RN

## 2019-04-10 DIAGNOSIS — G4733 Obstructive sleep apnea (adult) (pediatric): Secondary | ICD-10-CM | POA: Diagnosis not present

## 2019-04-16 DIAGNOSIS — L905 Scar conditions and fibrosis of skin: Secondary | ICD-10-CM | POA: Diagnosis not present

## 2019-04-16 DIAGNOSIS — L989 Disorder of the skin and subcutaneous tissue, unspecified: Secondary | ICD-10-CM | POA: Diagnosis not present

## 2019-04-16 DIAGNOSIS — C434 Malignant melanoma of scalp and neck: Secondary | ICD-10-CM | POA: Diagnosis not present

## 2019-05-11 DIAGNOSIS — G4733 Obstructive sleep apnea (adult) (pediatric): Secondary | ICD-10-CM | POA: Diagnosis not present

## 2019-05-14 ENCOUNTER — Other Ambulatory Visit: Payer: Self-pay | Admitting: Cardiology

## 2019-06-08 NOTE — Progress Notes (Signed)
Virtual Visit via Video Note changed to phone visit due to technical difficulties   This visit type was conducted due to national recommendations for restrictions regarding the COVID-19 Pandemic (e.g. social distancing) in an effort to limit this patient's exposure and mitigate transmission in our community.  Due to his co-morbid illnesses, this patient is at least at moderate risk for complications without adequate follow up.  This format is felt to be most appropriate for this patient at this time.  All issues noted in this document were discussed and addressed.  A limited physical exam was performed with this format.  Please refer to the patient's chart for his consent to telehealth for [facility]   Date:  06/09/2019   ID:  Scott White, DOB 29-Apr-1960, MRN [id number]  Patient Location:Home Provider Location: Home  PCP:  [name], MD  Cardiologist:  Dr Stanford Breed  Evaluation Performed:  Follow-Up Visit  Chief Complaint:  FU atrial fibrillation  History of Present Illness:    FU atrial fibrillation, coronary disease, hypertension and hyperlipidemia. Patient has had previous PCI of his right coronary artery in the setting of an acute infarct in 2004. No obstructive disease in the left system. He has had atrial fibrillation and has undergone previous ablation. Nuclear study in October of 2013 showed an ejection fraction of 50%. There was no ischemia.   Echocardiogram January 2021 showed normal LV function.  Patient was in recurrent atrial fibrillation at last office visit and cardioversion arranged but he was in sinus upon arrival to the hospital.  Since last seen, the patient denies any dyspnea on exertion, orthopnea, PND, pedal edema, palpitations, syncope or chest pain.  The patient does not have symptoms concerning for COVID-19 infection (fever, chills, cough, or new shortness of breath).    Past Medical History:  Diagnosis Date  . Atrial fibrillation ([place])    a.  Persistent, s/p afib ablation by JA 08/16/10;  b. s/p failed DCCV 05/2011; c. Chronic Tikosyn & Pradaxa (CHA2DS2VASc = 3-4).  [name] CAD (coronary artery disease)    a. 07/2002 Inf MI s/p BMS -->RCA, otw nonobs dzs;  b. 12/2011 Neg MV.  . Chronic diastolic CHF (congestive heart failure) ([place])   . Diabetes ([place])   . HTN (hypertension)   . Hyperlipidemia   . PVC's (premature ventricular contractions)   . Sleep apnea    followed by Dr Gwenette Greet   Past Surgical History:  Procedure Laterality Date  . atrial ablatio  08/16/10   afib ablation by JA  . CARDIAC CATHETERIZATION  2004    Successful PTCA and stent placement in the mid right   coronary artery with an extra 99% narrowing to 0% with placement of a 3.5 x  20 mm Express 2 stent with improvement of TIMI grade 1 flow to TIMI grade 3.  . CARDIOVERSION  06/15/2011   Procedure: CARDIOVERSION;  Surgeon: Fay Records, MD;  Location: [facility] OR;  Service: Cardiovascular;  Laterality: N/A;  . tonsilectomy       Current Meds  Medication Sig  . apixaban (ELIQUIS) 5 MG TABS tablet Take 1 tablet (5 mg total) by mouth 2 (two) times daily.  . Boron 3 MG CAPS Take 3 mg by mouth daily.   [name] dofetilide (TIKOSYN) 500 MCG capsule Take 1 capsule (500 mcg total) by mouth 2 (two) times daily.  [name] ezetimibe (ZETIA) 10 MG tablet Take 1 tablet (10 mg total) by mouth daily.  . Garlic [id number] MG TABS Take 500  mg by mouth 2 (two) times daily.   . Ginger, Zingiber officinalis, (GINGER PO) Take 1 tablet by mouth 2 (two) times daily.   [name] lisinopril (ZESTRIL) 20 MG tablet Take 1 tablet (20 mg total) by mouth every evening.  . Magnesium 100 MG TABS Take 100 mg by mouth 2 (two) times daily.   . metFORMIN (GLUCOPHAGE) 850 MG tablet Take 1 tablet (850 mg total) by mouth 2 (two) times daily with a meal.  . metoprolol succinate (TOPROL-XL) 25 MG 24 hr tablet Take 1 tablet (25 mg total) by mouth daily.  . Multiple Vitamins-Minerals (CENTRUM PO) Take 1 tablet by mouth daily.   . nitroGLYCERIN  (NITROSTAT) 0.4 MG SL tablet Place 1 tablet (0.4 mg total) under the tongue every 5 (five) minutes as needed. For chest pain at 3rd dose call 911  . Omega-3 Fatty Acids (FISH OIL PO) Take 1,000 mg by mouth 2 (two) times daily.   . rosuvastatin (CRESTOR) 40 MG tablet TAKE 1 TABLET BY MOUTH EVERY DAY (Patient taking differently: Take 40 mg by mouth daily. )  . tadalafil (CIALIS) 10 MG tablet Take 1 tablet (10 mg total) by mouth daily as needed for erectile dysfunction.     Allergies:   Patient has no known allergies.   Social History   Tobacco Use  . Smoking status: Never Smoker  . Smokeless tobacco: Never Used  Substance Use Topics  . Alcohol use: Yes    Alcohol/week: 1.0 standard drinks    Types: 1 Cans of beer per week    Comment:  three times a week  . Drug use: No     Family Hx: The patient's family history includes Alzheimer's disease in his brother and father; Coronary artery disease in his mother; Diabetes in his brother and father; Heart disease in his brother and mother; Heart failure in his mother; Hyperlipidemia in his brother, brother, and mother; Hypertension in his brother and mother. There is no history of Colon cancer, Pancreatic cancer, Rectal cancer, or Stomach cancer.  ROS:   Please see the history of present illness.    No Fever, chills  or productive cough All other systems reviewed and are negative.   Recent Labs: 02/09/2019: ALT 25 04/02/2019: BUN 11; Creatinine, Ser 0.88; Hemoglobin 15.9; Magnesium 1.9; Platelets 203; Potassium 4.8; Sodium 142; TSH 2.260   Recent Lipid Panel Lab Results  Component Value Date/Time   CHOL 159 02/09/2019 08:49 AM   TRIG 206 (H) 02/09/2019 08:49 AM   HDL 38 (L) 02/09/2019 08:49 AM   CHOLHDL 4.2 02/09/2019 08:49 AM   CHOLHDL 6.2 (H) 11/17/2015 08:28 AM   LDLCALC 86 02/09/2019 08:49 AM   LDLDIRECT 92.8 11/04/2012 10:10 AM    Wt Readings from Last 3 Encounters:  06/09/19 199 lb (90.3 kg)  04/02/19 205 lb (93 kg)    02/09/19 198 lb 6.4 oz (90 kg)     Objective:    Vital Signs:  BP 130/90   Pulse 85   Ht 5\' 11"  (1.803 m)   Wt 199 lb (90.3 kg)   BMI 27.75 kg/m    VITAL SIGNS:  reviewed NAD Answers questions appropriately Normal affect Remainder of physical examination not performed (telehealth visit; coronavirus pandemic)  ASSESSMENT & PLAN:    1. Paroxysmal atrial fibrillation-patient was in sinus rhythm at time of arrival for recent procedure.  By history he remains in sinus.  Continue Tikosyn, Toprol and apixaban. 2. Coronary artery disease-patient has not had chest pain.  Continue  statin.  No aspirin given need for apixaban. 3. Hypertension-blood pressure controlled.  Continue present medical regimen. 4. Hyperlipidemia-continue statin.  Continue Zetia.   5. Obstructive sleep apnea-continue CPAP.  COVID-19 Education: The importance of social distancing was discussed today.  Time:   Today, I have spent 15 minutes with the patient with telehealth technology discussing the above problems.     Medication Adjustments/Labs and Tests Ordered: Current medicines are reviewed at length with the patient today.  Concerns regarding medicines are outlined above.   Tests Ordered: No orders of the defined types were placed in this encounter.   Medication Changes: No orders of the defined types were placed in this encounter.   Follow Up:  Either In Person or Virtual in 6 month(s)  Signed, Kirk Ruths, MD  06/09/2019 8:24 AM    [place]

## 2019-06-09 ENCOUNTER — Encounter: Payer: Self-pay | Admitting: Cardiology

## 2019-06-09 ENCOUNTER — Telehealth (INDEPENDENT_AMBULATORY_CARE_PROVIDER_SITE_OTHER): Payer: BC Managed Care – PPO | Admitting: Cardiology

## 2019-06-09 ENCOUNTER — Telehealth: Payer: Self-pay | Admitting: *Deleted

## 2019-06-09 VITALS — BP 130/90 | HR 85 | Ht 71.0 in | Wt 199.0 lb

## 2019-06-09 DIAGNOSIS — I480 Paroxysmal atrial fibrillation: Secondary | ICD-10-CM | POA: Diagnosis not present

## 2019-06-09 DIAGNOSIS — Z9989 Dependence on other enabling machines and devices: Secondary | ICD-10-CM

## 2019-06-09 DIAGNOSIS — I10 Essential (primary) hypertension: Secondary | ICD-10-CM | POA: Diagnosis not present

## 2019-06-09 DIAGNOSIS — I2510 Atherosclerotic heart disease of native coronary artery without angina pectoris: Secondary | ICD-10-CM | POA: Diagnosis not present

## 2019-06-09 DIAGNOSIS — E7800 Pure hypercholesterolemia, unspecified: Secondary | ICD-10-CM

## 2019-06-09 DIAGNOSIS — G4733 Obstructive sleep apnea (adult) (pediatric): Secondary | ICD-10-CM

## 2019-06-09 MED ORDER — NITROGLYCERIN 0.4 MG SL SUBL: 0.4000 mg | SUBLINGUAL_TABLET | SUBLINGUAL | 4 refills | Status: AC | PRN

## 2019-06-09 NOTE — Patient Instructions (Signed)
Medication Instructions:  No changes *If you need a refill on your cardiac medications before your next appointment, please call your pharmacy*   Lab Work: Not needed If you have labs (blood work) drawn today and your tests are completely normal, you will receive your results only by: [name] MyChart Message (if you have MyChart) OR . A paper copy in the mail If you have any lab test that is abnormal or we need to change your treatment, we will call you to review the results.   Testing/Procedures: Not needed   Follow-Up: At [facility], you and your health needs are our priority.  As part of our continuing mission to provide you with exceptional heart care, we have created designated Provider Care Teams.  These Care Teams include your primary Cardiologist (physician) and Advanced Practice Providers (APPs -  Physician Assistants and Nurse Practitioners) who all work together to provide you with the care you need, when you need it.    Your next appointment:   6 month(s)  The format for your next appointment:   In Person  Provider:   Kirk Ruths, MD   Other Instructions

## 2019-06-09 NOTE — Telephone Encounter (Signed)
RN spoke to patient. Instruction were given  from today's virtual visit 06/09/19 .  AVS SUMMARY has been sent by mychart . Patient is aware not to use NTG sublingual with  The use of cialis day Before or 2 days after    Patient verbalized understanding

## 2019-06-10 DIAGNOSIS — G4733 Obstructive sleep apnea (adult) (pediatric): Secondary | ICD-10-CM | POA: Diagnosis not present

## 2019-07-01 ENCOUNTER — Other Ambulatory Visit: Payer: Self-pay | Admitting: Pharmacist

## 2019-07-01 MED ORDER — APIXABAN 5 MG PO TABS: 5.0000 mg | ORAL_TABLET | Freq: Two times a day (BID) | ORAL | 1 refills | Status: DC

## 2019-07-10 DIAGNOSIS — G4733 Obstructive sleep apnea (adult) (pediatric): Secondary | ICD-10-CM | POA: Diagnosis not present

## 2019-07-19 ENCOUNTER — Other Ambulatory Visit: Payer: Self-pay | Admitting: Family Medicine

## 2019-07-19 DIAGNOSIS — E785 Hyperlipidemia, unspecified: Secondary | ICD-10-CM

## 2019-07-19 NOTE — Telephone Encounter (Signed)
Requested Prescriptions  Pending Prescriptions Disp Refills  . rosuvastatin (CRESTOR) 40 MG tablet [Pharmacy Med Name: ROSUVASTATIN TABS 40MG ] 90 tablet 2    Sig: TAKE 1 TABLET DAILY     Cardiovascular:  Antilipid - Statins Failed - 07/19/2019  7:49 AM      Failed - HDL in normal range and within 360 days    HDL  Date Value Ref Range Status  02/09/2019 38 (L) >39 mg/dL Final         Failed - Triglycerides in normal range and within 360 days    Triglycerides  Date Value Ref Range Status  02/09/2019 206 (H) 0 - 149 mg/dL Final         Passed - Total Cholesterol in normal range and within 360 days    Cholesterol, Total  Date Value Ref Range Status  02/09/2019 159 100 - 199 mg/dL Final         Passed - LDL in normal range and within 360 days    LDL Chol Calc (NIH)  Date Value Ref Range Status  02/09/2019 86 0 - 99 mg/dL Final   Direct LDL  Date Value Ref Range Status  11/04/2012 92.8 mg/dL Final    Comment:    Optimal:  <100 mg/dLNear or Above Optimal:  100-129 mg/dLBorderline High:  130-159 mg/dLHigh:  160-189 mg/dLVery High:  >190 mg/dL         Passed - Patient is not pregnant      Passed - Valid encounter within last 12 months    Recent Outpatient Visits          5 months ago Annual physical exam   [facility], Ranell Patrick, MD   12 months ago Essential hypertension   [facility] [place], [place], MD   12 months ago Type 2 diabetes mellitus without complication, without long-term current use of insulin [facility])   [facility], Ranell Patrick, MD   1 year ago Type 2 diabetes mellitus without complication, without long-term current use of insulin [facility])   [facility], Ranell Patrick, MD   1 year ago Essential hypertension, benign   [facility], Ranell Patrick, MD

## 2019-07-27 ENCOUNTER — Encounter: Payer: Self-pay | Admitting: Family Medicine

## 2019-07-27 DIAGNOSIS — L821 Other seborrheic keratosis: Secondary | ICD-10-CM | POA: Diagnosis not present

## 2019-07-27 DIAGNOSIS — D225 Melanocytic nevi of trunk: Secondary | ICD-10-CM | POA: Diagnosis not present

## 2019-07-27 DIAGNOSIS — Z85820 Personal history of malignant melanoma of skin: Secondary | ICD-10-CM | POA: Diagnosis not present

## 2019-07-27 DIAGNOSIS — D2261 Melanocytic nevi of right upper limb, including shoulder: Secondary | ICD-10-CM | POA: Diagnosis not present

## 2019-07-27 DIAGNOSIS — L905 Scar conditions and fibrosis of skin: Secondary | ICD-10-CM | POA: Diagnosis not present

## 2019-07-27 DIAGNOSIS — D485 Neoplasm of uncertain behavior of skin: Secondary | ICD-10-CM | POA: Diagnosis not present

## 2019-08-02 ENCOUNTER — Encounter: Payer: Self-pay | Admitting: Family Medicine

## 2019-08-04 MED ORDER — TADALAFIL 10 MG PO TABS: 10.0000 mg | ORAL_TABLET | Freq: Every day | ORAL | 0 refills | Status: DC | PRN

## 2019-08-08 ENCOUNTER — Other Ambulatory Visit: Payer: Self-pay | Admitting: Family Medicine

## 2019-08-08 DIAGNOSIS — I10 Essential (primary) hypertension: Secondary | ICD-10-CM

## 2019-08-10 DIAGNOSIS — G4733 Obstructive sleep apnea (adult) (pediatric): Secondary | ICD-10-CM | POA: Diagnosis not present

## 2019-08-12 ENCOUNTER — Encounter: Payer: Self-pay | Admitting: Family Medicine

## 2019-08-13 ENCOUNTER — Encounter: Payer: Self-pay | Admitting: *Deleted

## 2019-09-09 DIAGNOSIS — G4733 Obstructive sleep apnea (adult) (pediatric): Secondary | ICD-10-CM | POA: Diagnosis not present

## 2019-10-02 ENCOUNTER — Other Ambulatory Visit: Payer: Self-pay | Admitting: Family Medicine

## 2019-10-06 ENCOUNTER — Other Ambulatory Visit: Payer: Self-pay | Admitting: Family Medicine

## 2019-10-06 DIAGNOSIS — I10 Essential (primary) hypertension: Secondary | ICD-10-CM

## 2019-10-06 NOTE — Telephone Encounter (Signed)
Requested Prescriptions  Pending Prescriptions Disp Refills  . lisinopril (ZESTRIL) 20 MG tablet [Pharmacy Med Name: LISINOPRIL TABS 20MG ] 30 tablet 0    Sig: TAKE 1 TABLET EVERY EVENING     Cardiovascular:  ACE Inhibitors Failed - 10/06/2019  1:02 AM      Failed - Cr in normal range and within 180 days    Creat  Date Value Ref Range Status  05/15/2016 0.92 0.70 - 1.33 mg/dL Final    Comment:      For patients > or = 59 years of age: The upper reference limit for Creatinine is approximately 13% higher for people identified as African-American.      Creatinine, Ser  Date Value Ref Range Status  04/02/2019 0.88 0.76 - 1.27 mg/dL Final         Failed - K in normal range and within 180 days    Potassium  Date Value Ref Range Status  04/02/2019 4.8 3.5 - 5.2 mmol/L Final         Failed - Last BP in normal range    BP Readings from Last 1 Encounters:  06/09/19 130/90         Failed - Valid encounter within last 6 months    Recent Outpatient Visits          7 months ago Annual physical exam   [facility], Ranell Patrick, MD   1 year ago Essential hypertension   [facility] [place], [place], MD   1 year ago Type 2 diabetes mellitus without complication, without long-term current use of insulin [facility])   [facility], Ranell Patrick, MD   1 year ago Type 2 diabetes mellitus without complication, without long-term current use of insulin [facility])   [facility], Ranell Patrick, MD   1 year ago Essential hypertension, benign   [facility], Ranell Patrick, MD             Passed - Patient is not pregnant      One month courtesy refill provided with reminder for patient to schedule an appointment for an office visit.

## 2019-10-07 DIAGNOSIS — D225 Melanocytic nevi of trunk: Secondary | ICD-10-CM | POA: Diagnosis not present

## 2019-10-07 DIAGNOSIS — L814 Other melanin hyperpigmentation: Secondary | ICD-10-CM | POA: Diagnosis not present

## 2019-10-07 DIAGNOSIS — L821 Other seborrheic keratosis: Secondary | ICD-10-CM | POA: Diagnosis not present

## 2019-10-07 DIAGNOSIS — L905 Scar conditions and fibrosis of skin: Secondary | ICD-10-CM | POA: Diagnosis not present

## 2019-10-09 DIAGNOSIS — G4733 Obstructive sleep apnea (adult) (pediatric): Secondary | ICD-10-CM | POA: Diagnosis not present

## 2019-10-10 ENCOUNTER — Other Ambulatory Visit: Payer: Self-pay | Admitting: Cardiology

## 2019-10-10 DIAGNOSIS — I10 Essential (primary) hypertension: Secondary | ICD-10-CM

## 2019-10-17 ENCOUNTER — Other Ambulatory Visit: Payer: Self-pay | Admitting: Family Medicine

## 2019-10-17 DIAGNOSIS — E119 Type 2 diabetes mellitus without complications: Secondary | ICD-10-CM

## 2019-10-17 NOTE — Telephone Encounter (Signed)
Requested  medications are  due for refill today yes  Requested medications are on the active medication list yes  Last refill 5/2  Last visit Nov 2020  Future visit scheduled No  Notes to clinic Failed protocol of visit within 6 months

## 2019-10-23 ENCOUNTER — Other Ambulatory Visit: Payer: Self-pay | Admitting: Family Medicine

## 2019-10-23 DIAGNOSIS — E119 Type 2 diabetes mellitus without complications: Secondary | ICD-10-CM

## 2019-10-23 NOTE — Telephone Encounter (Signed)
Requested medication (s) are due for refill today: Yes  Requested medication (s) are on the active medication list: Yes  Last refill:  10/19/19 to [facility]  Future visit scheduled: No  Notes to clinic:  Per protocol, failed due to no visit in 6 months. Last refill sent to [facility]. Unsure if patient has switched pharmacies.     Requested Prescriptions  Pending Prescriptions Disp Refills   metFORMIN (GLUCOPHAGE) 850 MG tablet [Pharmacy Med Name: METFORMIN HCL 850 MG TABLET] 60 tablet 5    Sig: TAKE 1 TABLET (850 MG TOTAL) BY MOUTH 2 (TWO) TIMES DAILY WITH A MEAL.      Endocrinology:  Diabetes - Biguanides Failed - 10/23/2019 11:04 AM      Failed - HBA1C is between 0 and 7.9 and within 180 days    Hgb A1c MFr Bld  Date Value Ref Range Status  02/09/2019 6.6 (H) 4.8 - 5.6 % Final    Comment:             Prediabetes: 5.7 - 6.4          Diabetes: >6.4          Glycemic control for adults with diabetes: <7.0           Failed - Valid encounter within last 6 months    Recent Outpatient Visits           8 months ago Annual physical exam   [facility], Ranell Patrick, MD   1 year ago Essential hypertension   [facility] [place], [place], MD   1 year ago Type 2 diabetes mellitus without complication, without long-term current use of insulin [facility])   [facility], Ranell Patrick, MD   1 year ago Type 2 diabetes mellitus without complication, without long-term current use of insulin [facility])   [facility], Ranell Patrick, MD   1 year ago Essential hypertension, benign   [facility], Ranell Patrick, MD              Passed - Cr in normal range and within 360 days    Creat  Date Value Ref Range Status  05/15/2016 0.92 0.70 - 1.33 mg/dL Final    Comment:      For patients > or = 59 years of age: The upper reference limit for Creatinine is approximately 13% higher for people identified  as African-American.      Creatinine, Ser  Date Value Ref Range Status  04/02/2019 0.88 0.76 - 1.27 mg/dL Final          Passed - eGFR in normal range and within 360 days    GFR, Est African American  Date Value Ref Range Status  11/17/2015 >89 >=60 mL/min Final   GFR calc Af Amer  Date Value Ref Range Status  04/02/2019 109 >59 mL/min/1.73 Final   GFR, Est Non African American  Date Value Ref Range Status  11/17/2015 >89 >=60 mL/min Final   GFR calc non Af Amer  Date Value Ref Range Status  04/02/2019 95 >59 mL/min/1.73 Final   GFR  Date Value Ref Range Status  01/14/2012 87.59 >60.00 mL/min Final

## 2019-10-23 NOTE — Telephone Encounter (Signed)
Patient called, left VM to return the call to the office to schedule an appointment in order to receive medication refills.

## 2019-10-23 NOTE — Telephone Encounter (Signed)
90Day supply has already been [facility]

## 2019-11-06 ENCOUNTER — Other Ambulatory Visit: Payer: Self-pay

## 2019-11-06 ENCOUNTER — Encounter: Payer: Self-pay | Admitting: Family Medicine

## 2019-11-06 ENCOUNTER — Ambulatory Visit: Payer: BC Managed Care – PPO | Admitting: Family Medicine

## 2019-11-06 VITALS — BP 128/74 | HR 68 | Temp 97.6°F | Resp 15 | Ht 71.0 in | Wt 206.0 lb

## 2019-11-06 DIAGNOSIS — I4891 Unspecified atrial fibrillation: Secondary | ICD-10-CM

## 2019-11-06 DIAGNOSIS — E1165 Type 2 diabetes mellitus with hyperglycemia: Secondary | ICD-10-CM | POA: Diagnosis not present

## 2019-11-06 DIAGNOSIS — Z9989 Dependence on other enabling machines and devices: Secondary | ICD-10-CM

## 2019-11-06 DIAGNOSIS — E119 Type 2 diabetes mellitus without complications: Secondary | ICD-10-CM

## 2019-11-06 DIAGNOSIS — I10 Essential (primary) hypertension: Secondary | ICD-10-CM

## 2019-11-06 DIAGNOSIS — G4733 Obstructive sleep apnea (adult) (pediatric): Secondary | ICD-10-CM | POA: Diagnosis not present

## 2019-11-06 DIAGNOSIS — E785 Hyperlipidemia, unspecified: Secondary | ICD-10-CM

## 2019-11-06 MED ORDER — METOPROLOL SUCCINATE ER 25 MG PO TB24: 25.0000 mg | ORAL_TABLET | Freq: Every day | ORAL | 2 refills | Status: DC

## 2019-11-06 MED ORDER — METFORMIN HCL 850 MG PO TABS: ORAL_TABLET | ORAL | 2 refills | Status: DC

## 2019-11-06 MED ORDER — ROSUVASTATIN CALCIUM 40 MG PO TABS: 40.0000 mg | ORAL_TABLET | Freq: Every day | ORAL | 2 refills | Status: DC

## 2019-11-06 NOTE — Patient Instructions (Addendum)
See info on checking your blood pressure accurately.  If readings over 140/90 return - schedule visit with your blood pressure machine to make sure it is accurate.   No change in medications for now, I did send some refills in on meds that may expire before 6 months.  Let me know if there are questions.   How to Take Your Blood Pressure Blood pressure is a measurement of how strongly your blood is pressing against the walls of your arteries. Arteries are blood vessels that carry blood from your heart throughout your body. Your health care provider takes your blood pressure at each office visit. You can also take your own blood pressure at home with a blood pressure machine. You may need to take your own blood pressure:  To confirm a diagnosis of high blood pressure (hypertension).  To monitor your blood pressure over time.  To make sure your blood pressure medicine is working. Supplies needed: To take your blood pressure, you will need a blood pressure machine. You can buy a blood pressure machine, or blood pressure monitor, at most drugstores or online. There are several types of home blood pressure monitors. When choosing one, consider the following:  Choose a monitor that has an arm cuff.  Choose a cuff that wraps snugly around your upper arm. You should be able to fit only one finger between your arm and the cuff.  Do not choose a monitor that measures your blood pressure from your wrist or finger. Your health care provider can suggest a reliable monitor that will meet your needs. How to prepare To get the most accurate reading, avoid the following for 30 minutes before you check your blood pressure:  Drinking caffeine.  Drinking alcohol.  Eating.  Smoking.  Exercising. Five minutes before you check your blood pressure:  Empty your bladder.  Sit quietly without talking in a dining chair, rather than in a soft couch or armchair. How to take your blood pressure To check  your blood pressure, follow the instructions in the manual that came with your blood pressure monitor. If you have a digital blood pressure monitor, the instructions may be as follows: 1. Sit up straight. 2. Place your feet on the floor. Do not cross your ankles or legs. 3. Rest your left arm at the level of your heart on a table or desk or on the arm of a chair. 4. Pull up your shirt sleeve. 5. Wrap the blood pressure cuff around the upper part of your left arm, 1 inch (2.5 cm) above your elbow. It is best to wrap the cuff around bare skin. 6. Fit the cuff snugly around your arm. You should be able to place only one finger between the cuff and your arm. 7. Position the cord inside the groove of your elbow. 8. Press the power button. 9. Sit quietly while the cuff inflates and deflates. 10. Read the digital reading on the monitor screen and write it down (record it). 11. Wait 2-3 minutes, then repeat the steps, starting at step 1. What does my blood pressure reading mean? A blood pressure reading consists of a higher number over a lower number. Ideally, your blood pressure should be below 120/80. The first ("top") number is called the systolic pressure. It is a measure of the pressure in your arteries as your heart beats. The second ("bottom") number is called the diastolic pressure. It is a measure of the pressure in your arteries as the heart relaxes. Blood  pressure is classified into four stages. The following are the stages for adults who do not have a short-term serious illness or a chronic condition. Systolic pressure and diastolic pressure are measured in a unit called mm Hg. Normal  Systolic pressure: below 629.  Diastolic pressure: below 80. Elevated  Systolic pressure: 528-413.  Diastolic pressure: below 80. Hypertension stage 1  Systolic pressure: 244-010.  Diastolic pressure: 27-25. Hypertension stage 2  Systolic pressure: 366 or above.  Diastolic pressure: 90 or  above. You can have prehypertension or hypertension even if only the systolic or only the diastolic number in your reading is higher than normal. Follow these instructions at home:  Check your blood pressure as often as recommended by your health care provider.  Take your monitor to the next appointment with your health care provider to make sure: ? That you are using it correctly. ? That it provides accurate readings.  Be sure you understand what your goal blood pressure numbers are.  Tell your health care provider if you are having any side effects from blood pressure medicine. Contact a health care provider if:  Your blood pressure is consistently high. Get help right away if:  Your systolic blood pressure is higher than 180.  Your diastolic blood pressure is higher than 110. This information is not intended to replace advice given to you by your health care provider. Make sure you discuss any questions you have with your health care provider. Document Revised: 02/15/2017 Document Reviewed: 08/12/2015 Elsevier Patient Education  [place].   Type 2 Diabetes Mellitus, Self Care, Adult Caring for yourself after you have been diagnosed with type 2 diabetes (type 2 diabetes mellitus) means keeping your blood sugar (glucose) under control with a balance of:  Nutrition.  Exercise.  Lifestyle changes.  Medicines or insulin, if necessary.  Support from your team of health care providers and others. The following information explains what you need to know to manage your diabetes at home. What are the risks? Having diabetes can put you at risk for other long-term (chronic) conditions, such as heart disease and kidney disease. Your health care provider may prescribe medicines to help prevent complications from diabetes. These medicines may include:  Aspirin.  Medicine to lower cholesterol.  Medicine to control blood pressure. How to monitor blood glucose   Check  your blood glucose every day, as often as told by your health care provider.  Have your A1c (hemoglobin A1c) level checked two or more times a year, or as often as told by your health care provider. Your health care provider will set individualized treatment goals for you. Generally, the goal of treatment is to maintain the following blood glucose levels:  Before meals (preprandial): 80-130 mg/dL (4.4-7.2 mmol/L).  After meals (postprandial): below 180 mg/dL (10 mmol/L).  A1c level: less than 7%. How to manage hyperglycemia and hypoglycemia Hyperglycemia symptoms Hyperglycemia, also called high blood glucose, occurs when blood glucose is too high. Make sure you know the early signs of hyperglycemia, such as:  Increased thirst.  Hunger.  Feeling very tired.  Needing to urinate more often than usual.  Blurry vision. Hypoglycemia symptoms Hypoglycemia, also called low blood glucose, occurswith a blood glucose level at or below 70 mg/dL (3.9 mmol/L). The risk for hypoglycemia increases during or after exercise, during sleep, during illness, and when skipping meals or not eating for a long time (fasting). It is important to know the symptoms of hypoglycemia and treat it right away. Always have  a 15-gram rapid-acting carbohydrate snack with you to treat low blood glucose. Family members and close friends should also know the symptoms and should understand how to treat hypoglycemia, in case you are not able to treat yourself. Symptoms may include:  Hunger.  Anxiety.  Sweating and feeling clammy.  Confusion.  Dizziness or feeling light-headed.  Sleepiness.  Nausea.  Increased heart rate.  Headache.  Blurry vision.  Irritability.  A change in coordination.  Tingling or numbness around the mouth, lips, or tongue.  Restless sleep.  Fainting.  Seizure. Treating hypoglycemia If you are alert and able to swallow safely, follow the 15:15 rule:  Take 15 grams of a  rapid-acting carbohydrate. Talk with your health care provider about how much you should take.  Rapid-acting options include: ? Glucose pills (take 15 grams). ? 6-8 pieces of hard candy. ? 4-6 oz (120-150 mL) of fruit juice. ? 4-6 oz (120-150 mL) of regular (not diet) soda. ? 1 Tbsp (15 mL) honey or sugar.  Check your blood glucose 15 minutes after you take the carbohydrate.  If the repeat blood glucose level is still at or below 70 mg/dL (3.9 mmol/L), take 15 grams of a carbohydrate again.  If your blood glucose level does not increase above 70 mg/dL (3.9 mmol/L) after 3 tries, seek emergency medical care.  After your blood glucose level returns to normal, eat a meal or a snack within 1 hour. Treating severe hypoglycemia Severe hypoglycemia is when your blood glucose level is at or below 54 mg/dL (3 mmol/L). Severe hypoglycemia is an emergency. Do not wait to see if the symptoms will go away. Get medical help right away. Call your local emergency services (911 in the [place] If you have severe hypoglycemia and you cannot eat or drink, you may need an injection of glucagon. A family member or close friend should learn how to check your blood glucose and how to give you a glucagon injection. Ask your health care provider if you need to have an emergency glucagon injection kit available. Severe hypoglycemia may need to be treated in a hospital. The treatment may include getting glucose through an IV. You may also need treatment for the cause of your hypoglycemia. Follow these instructions at home: Take diabetes medicines as told  If your health care provider prescribed insulin or diabetes medicines, take them every day.  Do not run out of insulin or other diabetes medicines that you take. Plan ahead so you always have these available.  If you use insulin, adjust your dosage based on how physically active you are and what foods you eat. Your health care provider will tell you how to adjust  your dosage. Make healthy food choices  The things that you eat and drink affect your blood glucose and your insulin dosage. Making good choices helps to control your diabetes and prevent other health problems. A healthy meal plan includes eating lean proteins, complex carbohydrates, fresh fruits and vegetables, low-fat dairy products, and healthy fats. Make an appointment to see a diet and nutrition specialist (registered dietitian) to help you create an eating plan that is right for you. Make sure that you:  Follow instructions from your health care provider about eating or drinking restrictions.  Drink enough fluid to keep your urine pale yellow.  Keep a record of the carbohydrates that you eat. Do this by reading food labels and learning the standard serving sizes of foods.  Follow your sick day plan whenever you cannot eat or  drink as usual. Make this plan in advance with your health care provider.  Stay active Exercise regularly, as told by your health care provider. This may include:  Stretching and doing strength exercises, such as yoga or weightlifting, 2 or more times a week.  Doing 150 minutes or more of moderate-intensity or vigorous-intensity exercise each week. This could be brisk walking, biking, or water aerobics. ? Spread out your activity over 3 or more days of the week. ? Do not go more than 2 days in a row without doing some kind of physical activity. When you start a new exercise or activity, work with your health care provider to adjust your insulin, medicines, or food intake as needed. Make healthy lifestyle choices  Do not use any tobacco products, such as cigarettes, chewing tobacco, and e-cigarettes. If you need help quitting, ask your health care provider.  If your health care provider says that alcohol is safe for you, limit alcohol intake to no more than 1 drink per day for nonpregnant women and 2 drinks per day for men. One drink equals 12 oz of beer (355 mL),  5 oz of wine (148 mL), or 1 oz of hard liquor (44 mL).  Learn to manage stress. If you need help with this, ask your health care provider. Care for your body   Keep your immunizations up to date. In addition to getting vaccinations as told by your health care provider, it is recommended that you get vaccinated against the following illnesses: ? The flu (influenza). Get a flu shot every year. ? Pneumonia. ? Hepatitis B.  Schedule an eye exam soon after your diagnosis, and then one time every year after that.  Check your skin and feet every day for cuts, bruises, redness, blisters, or sores. Schedule a foot exam with your health care provider once every year.  Brush your teeth and gums two times a day, and floss one or more times a day. Visit your dentist one or more times every 6 months.  Maintain a healthy weight. General instructions  Take over-the-counter and prescription medicines only as told by your health care provider.  Share your diabetes management plan with people in your workplace, school, and household.  Carry a medical alert card or wear medical alert jewelry.  Keep all follow-up visits as told by your health care provider. This is important. Questions to ask your health care provider  Do I need to meet with a diabetes educator?  Where can I find a support group for people with diabetes? Where to find more information For more information about diabetes, visit:  American Diabetes Association (ADA): www.diabetes.org  American Association of Diabetes Educators (AADE): www.diabeteseducator.org Summary  Caring for yourself after you have been diagnosed with (type 2 diabetes mellitus) means keeping your blood sugar (glucose) under control with a balance of nutrition, exercise, lifestyle changes, and medicine.  Check your blood glucose every day, as often as told by your health care provider.  Having diabetes can put you at risk for other long-term (chronic)  conditions, such as heart disease and kidney disease. Your health care provider may prescribe medicines to help prevent complications from diabetes.  Keep all follow-up visits as told by your health care provider. This is important. This information is not intended to replace advice given to you by your health care provider. Make sure you discuss any questions you have with your health care provider. Document Revised: 08/26/2017 Document Reviewed: 04/08/2015 Elsevier Patient Education  2020 [employer]  [employer].  If you have lab work done today you will be contacted with your lab results within the next 2 weeks.  If you have not heard from [place] then please contact us. The fastest way to get your results is to register for My Chart.   IF you received an x-ray today, you will receive an invoice from [facility]. Please contact [facility] at [phone] with questions or concerns regarding your invoice.   IF you received labwork today, you will receive an invoice from [facility]. Please contact [facility] at [phone] with questions or concerns regarding your invoice.   Our billing staff will not be able to assist you with questions regarding bills from these companies.  You will be contacted with the lab results as soon as they are available. The fastest way to get your results is to activate your My Chart account. Instructions are located on the last page of this paperwork. If you have not heard from [place] regarding the results in 2 weeks, please contact this office.

## 2019-11-06 NOTE — Progress Notes (Signed)
Subjective:  Patient ID: Scott White, male    DOB: 03-Jul-1960  Age: 59 y.o. MRN: [id number]  CC:  Chief Complaint  Patient presents with  . Diabetes    pt is doing well has been, pt has tried eating more salads and vegtibles rather than carbs for lunch and dinners, pt has also stopped going to [employer] as frequently.   . Atrial Fibrillation    pt has cut back on caffine to help with this, pt has been taking his BP and monitoring heart rate still feels flutters on the occasion but less frequent than before     HPI Scott White presents for   Diabetes: With prior hyperglycemia.  History of CAD.last eval in 01/2019 Treated with Metformin 850 mg twice daily.  He is on ACE inhibitor as well as statin. He has been trying to watch diet as above.  Also cutting back on caffeine. Microalbumin: Normal ratio 02/09/2019 Optho, foot exam, pneumovax: Up-to-date Home readings - up to 200 depending on diet, usually around 140-150 postprandial, fasting 110-120.   Lab Results  Component Value Date   HGBA1C 6.6 (H) 02/09/2019   HGBA1C 6.5 (H) 07/24/2018   HGBA1C 6.8 (A) 02/21/2018   Lab Results  Component Value Date   MICROALBUR <0.2 03/07/2015   LDLCALC 86 02/09/2019   CREATININE 0.88 04/02/2019   Atrial fibrillation: Cardiologist Dr. Stanford Breed, history of A. fib and CAD. Last appointment March 23.  Note reviewed. Previous PCI of right coronary artery in 2004.  Previous ablation for A. fib.  Normal LV function in January 2021 echo, prior EF 50% in 2013.  Recurrent/paroxysmal A. fib, sinus rhythm at the time of arrival for planned cardioversion.  Continued on Tikosyn, Toprol, apixaban.  Asymptomatic in regards to CAD, continue on statin.  He has cut back on [employer]/caffeine.  Occasional flutters, but less frequent.  Hypertension: Lisinopril 20 mg daily, Toprol 25 mg daily.  About 6 alcohol drinks per week.  Home readings: 130-150/90-110 Occasional Cialis for Erectile  dysfunction, nn HA. No CP with exertion, no vision or hearing changes.  Using cpap nightly for OSA.  BP Readings from Last 3 Encounters:  11/06/19 128/74  06/09/19 130/90  04/02/19 110/74   Lab Results  Component Value Date   CREATININE 0.88 04/02/2019   Hyperlipidemia: Crestor 40 mg daily.  No new myalgias/side effects.  Lab Results  Component Value Date   CHOL 159 02/09/2019   HDL 38 (L) 02/09/2019   LDLCALC 86 02/09/2019   LDLDIRECT 92.8 11/04/2012   TRIG 206 (H) 02/09/2019   CHOLHDL 4.2 02/09/2019   Lab Results  Component Value Date   ALT 25 02/09/2019   AST 22 02/09/2019   ALKPHOS 61 02/09/2019   BILITOT 0.5 02/09/2019       History Patient Active Problem List   Diagnosis Date Noted  . Complex sleep apnea syndrome 09/22/2010  . Long term (current) use of anticoagulants 07/03/2010  . Long term current use of anticoagulant 05/31/2010  . DYSLIPIDEMIA 09/27/2008  . HYPERTRIGLYCERIDEMIA 02/11/2008  . ESSENTIAL HYPERTENSION, BENIGN 02/11/2008  . CORONARY ATHEROSCLEROSIS NATIVE CORONARY ARTERY 02/11/2008  . ATRIAL FIBRILLATION 02/11/2008   Past Medical History:  Diagnosis Date  . Atrial fibrillation ([place])    a. Persistent, s/p afib ablation by [facility] 08/16/10;  b. s/p failed DCCV 05/2011; c. Chronic Tikosyn & Pradaxa (CHA2DS2VASc = 3-4).  [name] CAD (coronary artery disease)    a. 07/2002 Inf MI s/p BMS -->RCA, otw nonobs dzs;  b.  12/2011 Neg MV.  . Chronic diastolic CHF (congestive heart failure) ([place])   . Diabetes ([place])   . HTN (hypertension)   . Hyperlipidemia   . PVC's (premature ventricular contractions)   . Sleep apnea    followed by Dr Gwenette Greet   Past Surgical History:  Procedure Laterality Date  . atrial ablatio  08/16/10   afib ablation by [facility]  . CARDIAC CATHETERIZATION  2004    Successful PTCA and stent placement in the mid right   coronary artery with an extra 99% narrowing to 0% with placement of a 3.5 x  20 mm Express 2 stent with improvement of TIMI grade 1  flow to TIMI grade 3.  . CARDIOVERSION  06/15/2011   Procedure: CARDIOVERSION;  Surgeon: Fay Records, MD;  Location: [facility];  Service: Cardiovascular;  Laterality: N/A;  . tonsilectomy     No Known Allergies Prior to Admission medications   Medication Sig Start Date End Date Taking? Authorizing Provider  apixaban (ELIQUIS) 5 MG TABS tablet Take 1 tablet (5 mg total) by mouth 2 (two) times daily. 07/01/19  Yes Lelon Perla, MD  Boron 3 MG CAPS Take 3 mg by mouth daily.    Yes [provider]  dofetilide (TIKOSYN) 500 MCG capsule Take 1 capsule (500 mcg total) by mouth 2 (two) times daily. 05/14/19  Yes Lelon Perla, MD  Garlic 865 MG TABS Take 500 mg by mouth 2 (two) times daily.    Yes [provider]  Ginger, Zingiber officinalis, (GINGER PO) Take 1 tablet by mouth 2 (two) times daily.    Yes [provider]  lisinopril (ZESTRIL) 20 MG tablet TAKE 1 TABLET BY MOUTH EVERY DAY IN THE EVENING 10/12/19  Yes Crenshaw, Denice Bors, MD  Magnesium 100 MG TABS Take 100 mg by mouth 2 (two) times daily.    Yes [provider]  metFORMIN (GLUCOPHAGE) 850 MG tablet TAKE 1 TABLET (850 MG TOTAL) BY MOUTH 2 (TWO) TIMES DAILY WITH A MEAL. 10/23/19  Yes [name], MD  metoprolol succinate (TOPROL-XL) 25 MG 24 hr tablet TAKE 1 TABLET DAILY 08/08/19  Yes [name], MD  Multiple Vitamins-Minerals (CENTRUM PO) Take 1 tablet by mouth daily.    Yes [provider]  niacin (NIASPAN) 500 MG CR tablet Take 500 mg by mouth 2 (two) times daily.   Yes [provider]  nitroGLYCERIN (NITROSTAT) 0.4 MG SL tablet Place 1 tablet (0.4 mg total) under the tongue every 5 (five) minutes as needed. For chest pain at 3rd dose call 911 06/09/19  Yes Crenshaw, Denice Bors, MD  Omega-3 Fatty Acids (FISH OIL PO) Take 1,000 mg by mouth 2 (two) times daily.    Yes [provider]  rosuvastatin (CRESTOR) 40 MG tablet TAKE 1 TABLET DAILY 07/19/19  Yes [name],  MD  tadalafil (CIALIS) 10 MG tablet TAKE 1 TABLET DAILY AS NEEDED FOR ERECTILE DYSFUNCTION. DO NOT USE IF TAKING NITROGLYCERIN 10/02/19  Yes [name], MD  Turmeric (QC TUMERIC COMPLEX PO) Take 30 mg by mouth.   Yes [provider]  ezetimibe (ZETIA) 10 MG tablet Take 1 tablet (10 mg total) by mouth daily. 04/02/19 07/01/19  Lelon Perla, MD   Social History   Socioeconomic History  . Marital status: Single    Spouse name: Not on file  . Number of children: 0  . Years of education: Not on file  . Highest education level: Not on  file  Occupational History  . 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 Smoking status: Never Smoker  . Smokeless tobacco: Never Used  Substance and Sexual Activity  . Alcohol use: Yes    Alcohol/week: 1.0 standard drink    Types: 1 Cans of beer per week    Comment:  three times a week  . Drug use: No  . Sexual activity: Yes  Other Topics Concern  . Not on file  Social History Narrative   Pt lives in [place] alone.  Single.   He is a Teacher, English as a foreign language for [employer] Strain:   . Difficulty of Paying Living Expenses: Not on file  Food Insecurity:   . Worried About Charity fundraiser in the Last Year: Not on file  . Ran Out of Food in the Last Year: Not on file  Transportation Needs:   . Lack of Transportation (Medical): Not on file  . Lack of Transportation (Non-Medical): Not on file  Physical Activity:   . Days of Exercise per Week: Not on file  . Minutes of Exercise per Session: Not on file  Stress:   . Feeling of Stress : Not on file  Social Connections:   . Frequency of Communication with Friends and Family: Not on file  . Frequency of Social Gatherings with Friends and Family: Not on file  . Attends Religious Services: Not on file  . Active Member of Clubs or Organizations: Not on file  . Attends Archivist  Meetings: Not on file  . Marital Status: Not on file  Intimate Partner Violence:   . Fear of Current or Ex-Partner: Not on file  . Emotionally Abused: Not on file  . Physically Abused: Not on file  . Sexually Abused: Not on file    Review of Systems  Constitutional: Negative for fatigue and unexpected weight change.  Eyes: Negative for visual disturbance.  Respiratory: Negative for cough, chest tightness and shortness of breath.   Cardiovascular: Positive for palpitations (only at times of flutter as in past. ). Negative for chest pain and leg swelling.  Gastrointestinal: Negative for abdominal pain and blood in stool.  Neurological: Negative for dizziness, light-headedness and headaches.     Objective:   Vitals:   11/06/19 0757  BP: 128/74  Pulse: 68  Resp: 15  Temp: 97.6 F (36.4 C)  TempSrc: Temporal  SpO2: 96%  Weight: 206 lb (93.4 kg)  Height: 5' 11"  (1.803 m)     Physical Exam Vitals reviewed.  Constitutional:      Appearance: He is well-developed.  HENT:     Head: Normocephalic and atraumatic.  Eyes:     Pupils: Pupils are equal, round, and reactive to light.  Neck:     Vascular: No carotid bruit or JVD.  Cardiovascular:     Rate and Rhythm: Normal rate and regular rhythm.     Heart sounds: Normal heart sounds. No murmur heard.  No gallop.   Pulmonary:     Effort: Pulmonary effort is normal.     Breath sounds: Normal breath sounds. No rales.  Abdominal:     General: Abdomen is flat.     Tenderness: There is no abdominal tenderness.  Musculoskeletal:     Right lower leg: No edema.     Left lower leg: No edema.  Skin:    General: Skin is warm and  dry.  Neurological:     Mental Status: He is alert and oriented to person, place, and time.  Psychiatric:        Mood and Affect: Mood normal.        Assessment & Plan:  Scott White is a 59 y.o. male . Type 2 diabetes mellitus with hyperglycemia, without long-term current use of insulin (HCC)  - Plan: Hemoglobin A1c, Type 2 diabetes mellitus without complication, without long-term current use of insulin (HCC) - Plan: metFORMIN (GLUCOPHAGE) 850 MG tablet -Check A1c, tolerating Metformin, continue same  Hyperlipidemia, unspecified hyperlipidemia type - Plan: Comprehensive metabolic panel, Lipid panel, rosuvastatin (CRESTOR) 40 MG tablet  - Stable, tolerating current regimen. Medications refilled. Labs pending as above.   Atrial fibrillation, unspecified type ([place])  -Occasional symptoms of PVC versus flutter/fib.  Continue anticoagulation and rate control, antiarrhythmic as above.  Any routine follow-up with cardiology.  OSA on CPAP  -Compliant, stable  Essential hypertension - Plan: Comprehensive metabolic panel, metoprolol succinate (TOPROL-XL) 25 MG 24 hr tablet  -Variable readings at home, not consistent with been off this reading.  Handout given on appropriate blood pressure monitoring.  RTC precautions with his home monitor if elevated.  Asymptomatic.   Meds ordered this encounter  Medications  . metFORMIN (GLUCOPHAGE) 850 MG tablet    Sig: TAKE 1 TABLET (850 MG TOTAL) BY MOUTH 2 (TWO) TIMES DAILY WITH A MEAL.    Dispense:  180 tablet    Refill:  2  . metoprolol succinate (TOPROL-XL) 25 MG 24 hr tablet    Sig: Take 1 tablet (25 mg total) by mouth daily.    Dispense:  90 tablet    Refill:  2  . rosuvastatin (CRESTOR) 40 MG tablet    Sig: Take 1 tablet (40 mg total) by mouth daily.    Dispense:  90 tablet    Refill:  2   Patient Instructions     See info on checking your blood pressure accurately.  If readings over 140/90 return - schedule visit with your blood pressure machine to make sure it is accurate.   No change in medications for now, I did send some refills in on meds that may expire before 6 months.  Let me know if there are questions.   How to Take Your Blood Pressure Blood pressure is a measurement of how strongly your blood is pressing against the  walls of your arteries. Arteries are blood vessels that carry blood from your heart throughout your body. Your health care provider takes your blood pressure at each office visit. You can also take your own blood pressure at home with a blood pressure machine. You may need to take your own blood pressure:  To confirm a diagnosis of high blood pressure (hypertension).  To monitor your blood pressure over time.  To make sure your blood pressure medicine is working. Supplies needed: To take your blood pressure, you will need a blood pressure machine. You can buy a blood pressure machine, or blood pressure monitor, at most drugstores or online. There are several types of home blood pressure monitors. When choosing one, consider the following:  Choose a monitor that has an arm cuff.  Choose a cuff that wraps snugly around your upper arm. You should be able to fit only one finger between your arm and the cuff.  Do not choose a monitor that measures your blood pressure from your wrist or finger. Your health care provider can suggest a reliable monitor  that will meet your needs. How to prepare To get the most accurate reading, avoid the following for 30 minutes before you check your blood pressure:  Drinking caffeine.  Drinking alcohol.  Eating.  Smoking.  Exercising. Five minutes before you check your blood pressure:  Empty your bladder.  Sit quietly without talking in a dining chair, rather than in a soft couch or armchair. How to take your blood pressure To check your blood pressure, follow the instructions in the manual that came with your blood pressure monitor. If you have a digital blood pressure monitor, the instructions may be as follows: 1. Sit up straight. 2. Place your feet on the floor. Do not cross your ankles or legs. 3. Rest your left arm at the level of your heart on a table or desk or on the arm of a chair. 4. Pull up your shirt sleeve. 5. Wrap the blood pressure cuff  around the upper part of your left arm, 1 inch (2.5 cm) above your elbow. It is best to wrap the cuff around bare skin. 6. Fit the cuff snugly around your arm. You should be able to place only one finger between the cuff and your arm. 7. Position the cord inside the groove of your elbow. 8. Press the power button. 9. Sit quietly while the cuff inflates and deflates. 10. Read the digital reading on the monitor screen and write it down (record it). 11. Wait 2-3 minutes, then repeat the steps, starting at step 1. What does my blood pressure reading mean? A blood pressure reading consists of a higher number over a lower number. Ideally, your blood pressure should be below 120/80. The first ("top") number is called the systolic pressure. It is a measure of the pressure in your arteries as your heart beats. The second ("bottom") number is called the diastolic pressure. It is a measure of the pressure in your arteries as the heart relaxes. Blood pressure is classified into four stages. The following are the stages for adults who do not have a short-term serious illness or a chronic condition. Systolic pressure and diastolic pressure are measured in a unit called mm Hg. Normal  Systolic pressure: below 409.  Diastolic pressure: below 80. Elevated  Systolic pressure: 735-329.  Diastolic pressure: below 80. Hypertension stage 1  Systolic pressure: 924-268.  Diastolic pressure: 34-19. Hypertension stage 2  Systolic pressure: 622 or above.  Diastolic pressure: 90 or above. You can have prehypertension or hypertension even if only the systolic or only the diastolic number in your reading is higher than normal. Follow these instructions at home:  Check your blood pressure as often as recommended by your health care provider.  Take your monitor to the next appointment with your health care provider to make sure: ? That you are using it correctly. ? That it provides accurate readings.  Be sure  you understand what your goal blood pressure numbers are.  Tell your health care provider if you are having any side effects from blood pressure medicine. Contact a health care provider if:  Your blood pressure is consistently high. Get help right away if:  Your systolic blood pressure is higher than 180.  Your diastolic blood pressure is higher than 110. This information is not intended to replace advice given to you by your health care provider. Make sure you discuss any questions you have with your health care provider. Document Revised: 02/15/2017 Document Reviewed: 08/12/2015 Elsevier Patient Education  [place].   Type  2 Diabetes Mellitus, Self Care, Adult Caring for yourself after you have been diagnosed with type 2 diabetes (type 2 diabetes mellitus) means keeping your blood sugar (glucose) under control with a balance of:  Nutrition.  Exercise.  Lifestyle changes.  Medicines or insulin, if necessary.  Support from your team of health care providers and others. The following information explains what you need to know to manage your diabetes at home. What are the risks? Having diabetes can put you at risk for other long-term (chronic) conditions, such as heart disease and kidney disease. Your health care provider may prescribe medicines to help prevent complications from diabetes. These medicines may include:  Aspirin.  Medicine to lower cholesterol.  Medicine to control blood pressure. How to monitor blood glucose   Check your blood glucose every day, as often as told by your health care provider.  Have your A1c (hemoglobin A1c) level checked two or more times a year, or as often as told by your health care provider. Your health care provider will set individualized treatment goals for you. Generally, the goal of treatment is to maintain the following blood glucose levels:  Before meals (preprandial): 80-130 mg/dL (4.4-7.2 mmol/L).  After meals  (postprandial): below 180 mg/dL (10 mmol/L).  A1c level: less than 7%. How to manage hyperglycemia and hypoglycemia Hyperglycemia symptoms Hyperglycemia, also called high blood glucose, occurs when blood glucose is too high. Make sure you know the early signs of hyperglycemia, such as:  Increased thirst.  Hunger.  Feeling very tired.  Needing to urinate more often than usual.  Blurry vision. Hypoglycemia symptoms Hypoglycemia, also called low blood glucose, occurswith a blood glucose level at or below 70 mg/dL (3.9 mmol/L). The risk for hypoglycemia increases during or after exercise, during sleep, during illness, and when skipping meals or not eating for a long time (fasting). It is important to know the symptoms of hypoglycemia and treat it right away. Always have a 15-gram rapid-acting carbohydrate snack with you to treat low blood glucose. Family members and close friends should also know the symptoms and should understand how to treat hypoglycemia, in case you are not able to treat yourself. Symptoms may include:  Hunger.  Anxiety.  Sweating and feeling clammy.  Confusion.  Dizziness or feeling light-headed.  Sleepiness.  Nausea.  Increased heart rate.  Headache.  Blurry vision.  Irritability.  A change in coordination.  Tingling or numbness around the mouth, lips, or tongue.  Restless sleep.  Fainting.  Seizure. Treating hypoglycemia If you are alert and able to swallow safely, follow the 15:15 rule:  Take 15 grams of a rapid-acting carbohydrate. Talk with your health care provider about how much you should take.  Rapid-acting options include: ? Glucose pills (take 15 grams). ? 6-8 pieces of hard candy. ? 4-6 oz (120-150 mL) of fruit juice. ? 4-6 oz (120-150 mL) of regular (not diet) soda. ? 1 Tbsp (15 mL) honey or sugar.  Check your blood glucose 15 minutes after you take the carbohydrate.  If the repeat blood glucose level is still at or below  70 mg/dL (3.9 mmol/L), take 15 grams of a carbohydrate again.  If your blood glucose level does not increase above 70 mg/dL (3.9 mmol/L) after 3 tries, seek emergency medical care.  After your blood glucose level returns to normal, eat a meal or a snack within 1 hour. Treating severe hypoglycemia Severe hypoglycemia is when your blood glucose level is at or below 54 mg/dL (3 mmol/L). Severe hypoglycemia  is an emergency. Do not wait to see if the symptoms will go away. Get medical help right away. Call your local emergency services (911 in the [place] If you have severe hypoglycemia and you cannot eat or drink, you may need an injection of glucagon. A family member or close friend should learn how to check your blood glucose and how to give you a glucagon injection. Ask your health care provider if you need to have an emergency glucagon injection kit available. Severe hypoglycemia may need to be treated in a hospital. The treatment may include getting glucose through an IV. You may also need treatment for the cause of your hypoglycemia. Follow these instructions at home: Take diabetes medicines as told  If your health care provider prescribed insulin or diabetes medicines, take them every day.  Do not run out of insulin or other diabetes medicines that you take. Plan ahead so you always have these available.  If you use insulin, adjust your dosage based on how physically active you are and what foods you eat. Your health care provider will tell you how to adjust your dosage. Make healthy food choices  The things that you eat and drink affect your blood glucose and your insulin dosage. Making good choices helps to control your diabetes and prevent other health problems. A healthy meal plan includes eating lean proteins, complex carbohydrates, fresh fruits and vegetables, low-fat dairy products, and healthy fats. Make an appointment to see a diet and nutrition specialist (registered dietitian) to  help you create an eating plan that is right for you. Make sure that you:  Follow instructions from your health care provider about eating or drinking restrictions.  Drink enough fluid to keep your urine pale yellow.  Keep a record of the carbohydrates that you eat. Do this by reading food labels and learning the standard serving sizes of foods.  Follow your sick day plan whenever you cannot eat or drink as usual. Make this plan in advance with your health care provider.  Stay active Exercise regularly, as told by your health care provider. This may include:  Stretching and doing strength exercises, such as yoga or weightlifting, 2 or more times a week.  Doing 150 minutes or more of moderate-intensity or vigorous-intensity exercise each week. This could be brisk walking, biking, or water aerobics. ? Spread out your activity over 3 or more days of the week. ? Do not go more than 2 days in a row without doing some kind of physical activity. When you start a new exercise or activity, work with your health care provider to adjust your insulin, medicines, or food intake as needed. Make healthy lifestyle choices  Do not use any tobacco products, such as cigarettes, chewing tobacco, and e-cigarettes. If you need help quitting, ask your health care provider.  If your health care provider says that alcohol is safe for you, limit alcohol intake to no more than 1 drink per day for nonpregnant women and 2 drinks per day for men. One drink equals 12 oz of beer (355 mL), 5 oz of wine (148 mL), or 1 oz of hard liquor (44 mL).  Learn to manage stress. If you need help with this, ask your health care provider. Care for your body   Keep your immunizations up to date. In addition to getting vaccinations as told by your health care provider, it is recommended that you get vaccinated against the following illnesses: ? The flu (influenza). Get a flu shot every  year. ? Pneumonia. ? Hepatitis B.  Schedule  an eye exam soon after your diagnosis, and then one time every year after that.  Check your skin and feet every day for cuts, bruises, redness, blisters, or sores. Schedule a foot exam with your health care provider once every year.  Brush your teeth and gums two times a day, and floss one or more times a day. Visit your dentist one or more times every 6 months.  Maintain a healthy weight. General instructions  Take over-the-counter and prescription medicines only as told by your health care provider.  Share your diabetes management plan with people in your workplace, school, and household.  Carry a medical alert card or wear medical alert jewelry.  Keep all follow-up visits as told by your health care provider. This is important. Questions to ask your health care provider  Do I need to meet with a diabetes educator?  Where can I find a support group for people with diabetes? Where to find more information For more information about diabetes, visit:  American Diabetes Association (ADA): www.diabetes.org  American Association of Diabetes Educators (AADE): www.diabeteseducator.org Summary  Caring for yourself after you have been diagnosed with (type 2 diabetes mellitus) means keeping your blood sugar (glucose) under control with a balance of nutrition, exercise, lifestyle changes, and medicine.  Check your blood glucose every day, as often as told by your health care provider.  Having diabetes can put you at risk for other long-term (chronic) conditions, such as heart disease and kidney disease. Your health care provider may prescribe medicines to help prevent complications from diabetes.  Keep all follow-up visits as told by your health care provider. This is important. This information is not intended to replace advice given to you by your health care provider. Make sure you discuss any questions you have with your health care provider. Document Revised: 08/26/2017 Document  Reviewed: 04/08/2015 Elsevier Patient Education  [employer].  If you have lab work done today you will be contacted with your lab results within the next 2 weeks.  If you have not heard from [place] then please contact us. The fastest way to get your results is to register for My Chart.   IF you received an x-ray today, you will receive an invoice from [facility]. Please contact [facility] at [phone] with questions or concerns regarding your invoice.   IF you received labwork today, you will receive an invoice from [facility]. Please contact [facility] at [phone] with questions or concerns regarding your invoice.   Our billing staff will not be able to assist you with questions regarding bills from these companies.  You will be contacted with the lab results as soon as they are available. The fastest way to get your results is to activate your My Chart account. Instructions are located on the last page of this paperwork. If you have not heard from [place] regarding the results in 2 weeks, please contact this office.          Signed, Merri Ray, MD [facility]

## 2019-11-07 ENCOUNTER — Encounter: Payer: Self-pay | Admitting: Family Medicine

## 2019-11-07 LAB — COMPREHENSIVE METABOLIC PANEL
ALT: 30 IU/L (ref 0–44)
AST: 24 IU/L (ref 0–40)
Albumin/Globulin Ratio: 2 (ref 1.2–2.2)
Albumin: 4.5 g/dL (ref 3.8–4.9)
Alkaline Phosphatase: 67 IU/L (ref 48–121)
BUN/Creatinine Ratio: 14 (ref 9–20)
BUN: 13 mg/dL (ref 6–24)
Bilirubin Total: 0.5 mg/dL (ref 0.0–1.2)
CO2: 22 mmol/L (ref 20–29)
Calcium: 9.4 mg/dL (ref 8.7–10.2)
Chloride: 102 mmol/L (ref 96–106)
Creatinine, Ser: 0.9 mg/dL (ref 0.76–1.27)
GFR calc Af Amer: 108 mL/min/{1.73_m2} (ref >59)
GFR calc non Af Amer: 93 mL/min/{1.73_m2} (ref >59)
Globulin, Total: 2.3 g/dL (ref 1.5–4.5)
Glucose: 120 mg/dL — ABNORMAL HIGH (ref 65–99)
Potassium: 4.8 mmol/L (ref 3.5–5.2)
Sodium: 140 mmol/L (ref 134–144)
Total Protein: 6.8 g/dL (ref 6.0–8.5)

## 2019-11-07 LAB — HEMOGLOBIN A1C
Est. average glucose Bld gHb Est-mCnc: 160 mg/dL
Hgb A1c MFr Bld: 7.2 % — ABNORMAL HIGH (ref 4.8–5.6)

## 2019-11-07 LAB — LIPID PANEL
Chol/HDL Ratio: 3.7 ratio (ref 0.0–5.0)
Cholesterol, Total: 127 mg/dL (ref 100–199)
HDL: 34 mg/dL — ABNORMAL LOW (ref >39)
LDL Chol Calc (NIH): 60 mg/dL (ref 0–99)
Triglycerides: 196 mg/dL — ABNORMAL HIGH (ref 0–149)
VLDL Cholesterol Cal: 33 mg/dL (ref 5–40)

## 2019-11-09 DIAGNOSIS — G4733 Obstructive sleep apnea (adult) (pediatric): Secondary | ICD-10-CM | POA: Diagnosis not present

## 2019-11-21 ENCOUNTER — Encounter: Payer: Self-pay | Admitting: Family Medicine

## 2019-11-24 DIAGNOSIS — Z20822 Contact with and (suspected) exposure to covid-19: Secondary | ICD-10-CM | POA: Diagnosis not present

## 2019-11-24 NOTE — Telephone Encounter (Signed)
Pt feels he was being too lenient on himself and is taking metamucil before lunch each day has lost 3 lbs and is now asking to continue metformin until next apt and see if it will improve.

## 2019-12-09 DIAGNOSIS — G4733 Obstructive sleep apnea (adult) (pediatric): Secondary | ICD-10-CM | POA: Diagnosis not present

## 2019-12-16 ENCOUNTER — Other Ambulatory Visit: Payer: Self-pay | Admitting: Cardiology

## 2020-01-08 ENCOUNTER — Other Ambulatory Visit: Payer: Self-pay | Admitting: Family Medicine

## 2020-01-08 DIAGNOSIS — I10 Essential (primary) hypertension: Secondary | ICD-10-CM

## 2020-01-08 DIAGNOSIS — G4733 Obstructive sleep apnea (adult) (pediatric): Secondary | ICD-10-CM | POA: Diagnosis not present

## 2020-01-23 ENCOUNTER — Other Ambulatory Visit: Payer: Self-pay | Admitting: Family Medicine

## 2020-01-23 DIAGNOSIS — E119 Type 2 diabetes mellitus without complications: Secondary | ICD-10-CM

## 2020-02-08 DIAGNOSIS — G4733 Obstructive sleep apnea (adult) (pediatric): Secondary | ICD-10-CM | POA: Diagnosis not present

## 2020-02-09 DIAGNOSIS — L814 Other melanin hyperpigmentation: Secondary | ICD-10-CM | POA: Diagnosis not present

## 2020-02-09 DIAGNOSIS — L578 Other skin changes due to chronic exposure to nonionizing radiation: Secondary | ICD-10-CM | POA: Diagnosis not present

## 2020-02-09 DIAGNOSIS — L905 Scar conditions and fibrosis of skin: Secondary | ICD-10-CM | POA: Diagnosis not present

## 2020-02-09 DIAGNOSIS — Z85820 Personal history of malignant melanoma of skin: Secondary | ICD-10-CM | POA: Diagnosis not present

## 2020-03-04 ENCOUNTER — Other Ambulatory Visit: Payer: Self-pay | Admitting: Cardiology

## 2020-03-04 DIAGNOSIS — E7800 Pure hypercholesterolemia, unspecified: Secondary | ICD-10-CM

## 2020-03-09 DIAGNOSIS — G4733 Obstructive sleep apnea (adult) (pediatric): Secondary | ICD-10-CM | POA: Diagnosis not present

## 2020-04-07 ENCOUNTER — Other Ambulatory Visit: Payer: Self-pay | Admitting: Family Medicine

## 2020-04-07 DIAGNOSIS — E785 Hyperlipidemia, unspecified: Secondary | ICD-10-CM

## 2020-04-08 DIAGNOSIS — G4733 Obstructive sleep apnea (adult) (pediatric): Secondary | ICD-10-CM | POA: Diagnosis not present

## 2020-04-15 ENCOUNTER — Other Ambulatory Visit: Payer: Self-pay | Admitting: Cardiology

## 2020-05-09 DIAGNOSIS — G4733 Obstructive sleep apnea (adult) (pediatric): Secondary | ICD-10-CM | POA: Diagnosis not present

## 2020-05-11 ENCOUNTER — Ambulatory Visit (INDEPENDENT_AMBULATORY_CARE_PROVIDER_SITE_OTHER): Payer: BC Managed Care – PPO | Admitting: Family Medicine

## 2020-05-11 ENCOUNTER — Encounter: Payer: Self-pay | Admitting: Family Medicine

## 2020-05-11 VITALS — BP 126/75 | HR 96 | Temp 97.4°F | Ht 71.0 in | Wt 205.0 lb

## 2020-05-11 DIAGNOSIS — Z125 Encounter for screening for malignant neoplasm of prostate: Secondary | ICD-10-CM

## 2020-05-11 DIAGNOSIS — E785 Hyperlipidemia, unspecified: Secondary | ICD-10-CM

## 2020-05-11 DIAGNOSIS — Z0000 Encounter for general adult medical examination without abnormal findings: Secondary | ICD-10-CM

## 2020-05-11 DIAGNOSIS — N529 Male erectile dysfunction, unspecified: Secondary | ICD-10-CM

## 2020-05-11 DIAGNOSIS — I10 Essential (primary) hypertension: Secondary | ICD-10-CM

## 2020-05-11 DIAGNOSIS — E119 Type 2 diabetes mellitus without complications: Secondary | ICD-10-CM

## 2020-05-11 DIAGNOSIS — G4733 Obstructive sleep apnea (adult) (pediatric): Secondary | ICD-10-CM

## 2020-05-11 DIAGNOSIS — I4891 Unspecified atrial fibrillation: Secondary | ICD-10-CM

## 2020-05-11 DIAGNOSIS — Z0001 Encounter for general adult medical examination with abnormal findings: Secondary | ICD-10-CM

## 2020-05-11 DIAGNOSIS — Z9989 Dependence on other enabling machines and devices: Secondary | ICD-10-CM

## 2020-05-11 MED ORDER — METFORMIN HCL 850 MG PO TABS: ORAL_TABLET | ORAL | 1 refills | Status: DC

## 2020-05-11 MED ORDER — TADALAFIL 10 MG PO TABS: ORAL_TABLET | ORAL | 4 refills | Status: DC

## 2020-05-11 NOTE — Patient Instructions (Addendum)
No med changes at this time. Depending on A1c we can discuss options.  Let me know if there are questions. See you in 6 months.   Keeping you healthy  Get these tests  Blood pressure- Have your blood pressure checked once a year by your healthcare provider.  Normal blood pressure is 120/80  Weight- Have your body mass index (BMI) calculated to screen for obesity.  BMI is a measure of body fat based on height and weight. You can also calculate your own BMI at [email or web address].  Cholesterol- Have your cholesterol checked every year.  Diabetes- Have your blood sugar checked regularly if you have high blood pressure, high cholesterol, have a family history of diabetes or if you are overweight.  Screening for Colon Cancer- Colonoscopy starting at age 58.  Screening may begin sooner depending on your family history and other health conditions. Follow up colonoscopy as directed by your Gastroenterologist.  Screening for Prostate Cancer- Both blood work (PSA) and a rectal exam help screen for Prostate Cancer.  Screening begins at age 75 with African-American men and at age 81 with Caucasian men.  Screening may begin sooner depending on your family history.   Take these medicines  Flu shot- Every fall.  Tetanus- Every 10 years.  Pneumonia shot- Once after the age of 26; if you are younger than 36, ask your healthcare provider if you need a Pneumonia shot.  Take these steps  Don't smoke- If you do smoke, talk to your doctor about quitting.  For tips on how to quit, go to www.smokefree.gov or call 1-800-QUIT-NOW.  Be physically active- Exercise 5 days a week for at least 30 minutes.  If you are not already physically active start slow and gradually work up to 30 minutes of moderate physical activity.  Examples of moderate activity include walking briskly, mowing the yard, dancing, swimming, bicycling, etc.  Eat a healthy diet- Eat a variety of healthy food such as fruits, vegetables,  low fat milk, low fat cheese, yogurt, lean meant, poultry, fish, beans, tofu, etc. For more information go to www.thenutritionsource.org  Drink alcohol in moderation- Limit alcohol intake to less than two drinks a day. Never drink and drive.  Dentist- Brush and floss twice daily; visit your dentist twice a year.  Depression- Your emotional health is as important as your physical health. If you're feeling down, or losing interest in things you would normally enjoy please talk to your healthcare provider.  Eye exam- Visit your eye doctor every year.  Safe sex- If you may be exposed to a sexually transmitted infection, use a condom.  Seat belts- Seat belts can save your life; always wear one.  Smoke/Carbon Monoxide detectors- These detectors need to be installed on the appropriate level of your home.  Replace batteries at least once a year.  Skin cancer- When out in the sun, cover up and use sunscreen 15 SPF or higher.  Violence- If anyone is threatening you, please tell your healthcare provider.  Living Will/ Health care power of attorney- Speak with your healthcare provider and family.   If you have lab work done today you will be contacted with your lab results within the next 2 weeks.  If you have not heard from [place] then please contact us. The fastest way to get your results is to register for My Chart.   IF you received an x-ray today, you will receive an invoice from [facility] Radiology. Please contact [facility]iology at [phone] with questions or concerns  regarding your invoice.   IF you received labwork today, you will receive an invoice from [facility]. Please contact [facility] at [phone] with questions or concerns regarding your invoice.   Our billing staff will not be able to assist you with questions regarding bills from these companies.  You will be contacted with the lab results as soon as they are available. The fastest way to get your results is to activate  your My Chart account. Instructions are located on the last page of this paperwork. If you have not heard from [place] regarding the results in 2 weeks, please contact this office.

## 2020-05-11 NOTE — Progress Notes (Signed)
Subjective:  Patient ID: Scott White, male    DOB: 14-Nov-1960  Age: 60 y.o. MRN: [id number]  CC:  Chief Complaint  Patient presents with  . Annual Exam    Pt reports he feel well. Pt reports still having some trouble loosing the christmas weight he picked up back in December, but pt reports that he watches his diet due to his diabetes and hyperlipidemia. Pt states he stays away from sugars and carbs for the most part.    HPI Malachy Chamber Lahti presents for   Annual physical exam  Diabetes: With hyperglycemia, CAD.  Slight increased A1c in August.  Planned improved diet, continued same regimen with Metformin 850 mg twice daily. He is on ACE inhibitor, statin, Zetia. Few pounds weight gain since Christmas. Less exercise with family illness. Spike in readings at times with certain meals. Cut back on sugar in diet, taking metamucil.   Microalbumin: Normal ratio 02/09/2019 Optho, foot exam, pneumovax: Up-to-date with foot exam today.  Fasting readings: 120, no lows.  2-hour postprandial: 160-200.    Lab Results  Component Value Date   HGBA1C 7.2 (H) 11/06/2019   HGBA1C 6.6 (H) 02/09/2019   HGBA1C 6.5 (H) 07/24/2018   Lab Results  Component Value Date   MICROALBUR <0.2 03/07/2015   LDLCALC 60 11/06/2019   CREATININE 0.90 11/06/2019   Atrial fibrillation Cardiologist Dr. Stanford Breed history of CAD with PCI of RCA in 2004, prior ablation for A. Fib, recurrent A. fib treated with Tikosyn, Toprol, Eliquis. no new chest pains. Episodic palpitations, has appt with cardiology next month.   Hypertension: Lisinopril 20 mg daily, Toprol 25 mg daily.  Also history of OSA uses CPAP nightly. No new side effects.  Home readings: 112/70, under 130/80.  BP Readings from Last 3 Encounters:  05/11/20 126/75  11/06/19 128/74  06/09/19 130/90   Lab Results  Component Value Date   CREATININE 0.90 11/06/2019   Hyperlipidemia: Crestor 40 mg daily, Zetia 10 mg daily.no new myalgias/side  effects. On niacinamide as well. Fasting today.  Lab Results  Component Value Date   CHOL 127 11/06/2019   HDL 34 (L) 11/06/2019   LDLCALC 60 11/06/2019   LDLDIRECT 92.8 11/04/2012   TRIG 196 (H) 11/06/2019   CHOLHDL 3.7 11/06/2019   Lab Results  Component Value Date   ALT 30 11/06/2019   AST 24 11/06/2019   ALKPHOS 67 11/06/2019   BILITOT 0.5 11/06/2019    Erectile dysfunction  Cialis as needed - 10 mg. Episodic use, no new side effects. Min flushing. No vision/hearing changes. No CP with exertion.   Cancer screening Colonoscopy 05/15/2013 repeat 10 years.  Lab Results  Component Value Date   PSA1 0.8 03/25/2017   PSA 0.73 03/16/2013  The natural history of prostate cancer and ongoing controversy regarding screening and potential treatment outcomes of prostate cancer has been discussed with the patient. The meaning of a false positive PSA and a false negative PSA has been discussed. He indicates understanding of the limitations of this screening test and wishes to proceed with screening PSA testing, declined DRE.   Immunization History  Administered Date(s) Administered  . Influenza-Unspecified 11/17/2012  . Janssen (J&J) SARS-COV-2 Vaccination 06/11/2019  . Pneumococcal Polysaccharide-23 03/25/2017  . Tdap 11/16/2013  Shingles vaccine: had from pharmacy last year.  COVID booster : Pfizer late last year.   Depression screen [facility] 2/9 02/09/2019 07/22/2018 02/21/2018 03/25/2017 11/17/2015  Decreased Interest 0 0 0 0 0  Down, Depressed, Hopeless 0 0  0 0 0  PHQ - 2 Score 0 0 0 0 0   No exam data present optho - last visit in December, Dr. Nicki Reaper at [facility].   Dental: Every 6 months, Dr. Carlye Grippe at [facility]  Exercise:  Less past 6 weeks, did go last night   History Patient Active Problem List   Diagnosis Date Noted  . Complex sleep apnea syndrome 09/22/2010  . Long term (current) use of anticoagulants 07/03/2010  . Long term current use of  anticoagulant 05/31/2010  . DYSLIPIDEMIA 09/27/2008  . HYPERTRIGLYCERIDEMIA 02/11/2008  . ESSENTIAL HYPERTENSION, BENIGN 02/11/2008  . CORONARY ATHEROSCLEROSIS NATIVE CORONARY ARTERY 02/11/2008  . ATRIAL FIBRILLATION 02/11/2008   Past Medical History:  Diagnosis Date  . Atrial fibrillation ([place])    a. Persistent, s/p afib ablation by JA 08/16/10;  b. s/p failed DCCV 05/2011; c. Chronic Tikosyn & Pradaxa (CHA2DS2VASc = 3-4).  [name] CAD (coronary artery disease)    a. 07/2002 Inf MI s/p BMS -->RCA, otw nonobs dzs;  b. 12/2011 Neg MV.  . Cancer ([place])    Phreesia 05/09/2020  . CHF (congestive heart failure) ([place])    Phreesia 05/09/2020  . Chronic diastolic CHF (congestive heart failure) ([place])   . Diabetes ([place])   . Diabetes mellitus without complication ([place])    Phreesia 05/09/2020  . HTN (hypertension)   . Hyperlipidemia   . Hypertension    Phreesia 05/09/2020  . Myocardial infarction ([place])    Phreesia 05/09/2020  . PVC's (premature ventricular contractions)   . Sleep apnea    followed by Dr Gwenette Greet   Past Surgical History:  Procedure Laterality Date  . atrial ablatio  08/16/10   afib ablation by JA  . CARDIAC CATHETERIZATION  2004    Successful PTCA and stent placement in the mid right   coronary artery with an extra 99% narrowing to 0% with placement of a 3.5 x  20 mm Express 2 stent with improvement of TIMI grade 1 flow to TIMI grade 3.  . CARDIOVERSION  06/15/2011   Procedure: CARDIOVERSION;  Surgeon: Fay Records, MD;  Location: [place];  Service: Cardiovascular;  Laterality: N/A;  . HERNIA REPAIR N/A    Phreesia 05/09/2020  . tonsilectomy     No Known Allergies Prior to Admission medications   Medication Sig Start Date End Date Taking? Authorizing Provider  Boron 3 MG CAPS Take 3 mg by mouth daily.    Yes [provider]  dofetilide (TIKOSYN) 500 MCG capsule TAKE 1 CAPSULE TWICE A DAY 04/15/20  Yes Crenshaw, Denice Bors, MD  ELIQUIS 5 MG TABS tablet TAKE 1 TABLET TWICE A  DAY 12/16/19  Yes Lelon Perla, MD  ezetimibe (ZETIA) 10 MG tablet TAKE 1 TABLET DAILY 03/04/20  Yes Lelon Perla, MD  Garlic 607 MG TABS Take 500 mg by mouth 2 (two) times daily.    Yes [provider]  Ginger, Zingiber officinalis, (GINGER PO) Take 1 tablet by mouth 2 (two) times daily.    Yes [provider]  HAWTHORN BERRY PO Take by mouth.   Yes [provider]  lisinopril (ZESTRIL) 20 MG tablet TAKE 1 TABLET EVERY EVENING (COURTESY REFILL, NEED TO SCHEDULE APPOINTMENT FOR OFFICE VISIT) 01/08/20  Yes [name], MD  Magnesium 100 MG TABS Take 100 mg by mouth 2 (two) times daily.    Yes [provider]  metFORMIN (GLUCOPHAGE) 850 MG tablet TAKE 1 TABLET (850 MG TOTAL) BY MOUTH 2 (TWO)  TIMES DAILY WITH A MEAL. 01/23/20  Yes [name], MD  metoprolol succinate (TOPROL-XL) 25 MG 24 hr tablet Take 1 tablet (25 mg total) by mouth daily. 11/06/19  Yes [name], MD  Multiple Vitamins-Minerals (CENTRUM PO) Take 1 tablet by mouth daily.   Yes [provider]  niacin (NIASPAN) 500 MG CR tablet Take 500 mg by mouth 2 (two) times daily.   Yes [provider]  nitroGLYCERIN (NITROSTAT) 0.4 MG SL tablet Place 1 tablet (0.4 mg total) under the tongue every 5 (five) minutes as needed. For chest pain at 3rd dose call 911 06/09/19  Yes Crenshaw, Denice Bors, MD  Omega-3 Fatty Acids (FISH OIL PO) Take 1,000 mg by mouth 2 (two) times daily.    Yes [provider]  rosuvastatin (CRESTOR) 40 MG tablet TAKE 1 TABLET DAILY 04/07/20  Yes [name], MD  tadalafil (CIALIS) 10 MG tablet TAKE 1 TABLET DAILY AS NEEDED FOR ERECTILE DYSFUNCTION. DO NOT USE IF TAKING NITROGLYCERIN 10/02/19  Yes [name], MD  Turmeric (QC TUMERIC COMPLEX PO) Take 30 mg by mouth.    [provider]   Social History   Socioeconomic History  . Marital status: Single    Spouse name: Not on file  . Number of children: 0  . Years of  education: Not on file  . Highest education level: Not on file  Occupational History  . 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 Smoking status: Never Smoker  . Smokeless tobacco: Never Used  Vaping Use  . Vaping Use: Never used  Substance and Sexual Activity  . Alcohol use: Yes    Alcohol/week: 1.0 standard drink    Types: 1 Cans of beer per week    Comment:  three times a week  . Drug use: No  . Sexual activity: Yes  Other Topics Concern  . Not on file  Social History Narrative   Pt lives in [place] alone.  Single.   He is a Teacher, English as a foreign language for [place] Strain: Not on file  Food Insecurity: Not on file  Transportation Needs: Not on file  Physical Activity: Not on file  Stress: Not on file  Social Connections: Not on file  Intimate Partner Violence: Not on file    Review of Systems   Objective:   Vitals:   05/11/20 0808  BP: 126/75  Pulse: 96  Temp: (!) 97.4 F (36.3 C)  TempSrc: Temporal  SpO2: 96%  Weight: 205 lb (93 kg)  Height: 5\' 11"  (1.803 m)     Physical Exam Vitals reviewed.  Constitutional:      Appearance: He is well-developed.  HENT:     Head: Normocephalic and atraumatic.     Right Ear: External ear normal.     Left Ear: External ear normal.  Eyes:     Conjunctiva/sclera: Conjunctivae normal.     Pupils: Pupils are equal, round, and reactive to light.  Neck:     Thyroid: No thyromegaly.  Cardiovascular:     Rate and Rhythm: Normal rate and regular rhythm.     Heart sounds: Normal heart sounds.  Pulmonary:     Effort: Pulmonary effort is normal. No respiratory distress.     Breath sounds: Normal breath sounds. No wheezing.  Abdominal:     General: There is no distension.     Palpations:  Abdomen is soft.     Tenderness: There is no abdominal tenderness.  Musculoskeletal:        General: No tenderness. Normal range of motion.      Cervical back: Normal range of motion and neck supple.  Lymphadenopathy:     Cervical: No cervical adenopathy.  Skin:    General: Skin is warm and dry.  Neurological:     Mental Status: He is alert and oriented to person, place, and time.     Deep Tendon Reflexes: Reflexes are normal and symmetric.  Psychiatric:        Behavior: Behavior normal.     Assessment & Plan:  Vinton Layson Romulus is a 60 y.o. male . Annual physical exam  - -anticipatory guidance as below in AVS, screening labs above. Health maintenance items as above in HPI discussed/recommended as applicable.   Type 2 diabetes mellitus without complication, without long-term current use of insulin ([place]) - Plan: metFORMIN (GLUCOPHAGE) 850 MG tablet, Hemoglobin A1c  - check A1c.  has restarted exercise. Consider sglt2. Continue metformin for now.   Hyperlipidemia, unspecified hyperlipidemia type - Plan: Comprehensive metabolic panel, Lipid panel  -  Stable, tolerating current regimen. Medications refilled. Labs pending as above.   OSA on CPAP   -compliant - continue cpap.   Atrial fibrillation, unspecified type ([place]) - Plan: CBC  - intermittent breakthrough sx's. Anticoagulated and on rate control, as well as tikosyn. Follow up planned with cardiology.   Essential hypertension - Plan: Comprehensive metabolic panel  -  Stable, tolerating current regimen. Medications refilled. Labs pending as above.   Erectile dysfunction, unspecified erectile dysfunction type - Plan: tadalafil (CIALIS) 10 MG tablet  - cialis Rx given - use lowest effective dose. Side effects discussed (including but not limited to headache/flushing, blue discoloration of vision, possible vascular steal and risk of cardiac effects if underlying unknown coronary artery disease, and permanent sensorineural hearing loss). Understanding expressed.  Screening for malignant neoplasm of prostate - Plan: PSA  - We discussed pros and cons of prostate cancer  screening, and after this discussion, he chose to have screening done. PSA obtained,DRE declined.   Meds ordered this encounter  Medications  . metFORMIN (GLUCOPHAGE) 850 MG tablet    Sig: TAKE 1 TABLET (850 MG TOTAL) BY MOUTH 2 (TWO) TIMES DAILY WITH A MEAL.    Dispense:  180 tablet    Refill:  1  . tadalafil (CIALIS) 10 MG tablet    Sig: TAKE 1 TABLET DAILY AS NEEDED FOR ERECTILE DYSFUNCTION. DO NOT USE IF TAKING NITROGLYCERIN    Dispense:  24 tablet    Refill:  4   Patient Instructions    No med changes at this time. Depending on A1c we can discuss options.  Let me know if there are questions. See you in 6 months.   Keeping you healthy  Get these tests  Blood pressure- Have your blood pressure checked once a year by your healthcare provider.  Normal blood pressure is 120/80  Weight- Have your body mass index (BMI) calculated to screen for obesity.  BMI is a measure of body fat based on height and weight. You can also calculate your own BMI at ViewBanking.[email or web address].  Cholesterol- Have your cholesterol checked every year.  Diabetes- Have your blood sugar checked regularly if you have high blood pressure, high cholesterol, have a family history of diabetes or if you are overweight.  Screening for Colon Cancer- Colonoscopy starting at age 27.  Screening may begin  sooner depending on your family history and other health conditions. Follow up colonoscopy as directed by your Gastroenterologist.  Screening for Prostate Cancer- Both blood work (PSA) and a rectal exam help screen for Prostate Cancer.  Screening begins at age 83 with African-American men and at age 60 with Caucasian men.  Screening may begin sooner depending on your family history.   Take these medicines  Flu shot- Every fall.  Tetanus- Every 10 years.  Pneumonia shot- Once after the age of 90; if you are younger than 21, ask your healthcare provider if you need a Pneumonia shot.  Take these steps  Don't  smoke- If you do smoke, talk to your doctor about quitting.  For tips on how to quit, go to www.smokefree.gov or call 1-800-QUIT-NOW.  Be physically active- Exercise 5 days a week for at least 30 minutes.  If you are not already physically active start slow and gradually work up to 30 minutes of moderate physical activity.  Examples of moderate activity include walking briskly, mowing the yard, dancing, swimming, bicycling, etc.  Eat a healthy diet- Eat a variety of healthy food such as fruits, vegetables, low fat milk, low fat cheese, yogurt, lean meant, poultry, fish, beans, tofu, etc. For more information go to www.thenutritionsource.org  Drink alcohol in moderation- Limit alcohol intake to less than two drinks a day. Never drink and drive.  Dentist- Brush and floss twice daily; visit your dentist twice a year.  Depression- Your emotional health is as important as your physical health. If you're feeling down, or losing interest in things you would normally enjoy please talk to your healthcare provider.  Eye exam- Visit your eye doctor every year.  Safe sex- If you may be exposed to a sexually transmitted infection, use a condom.  Seat belts- Seat belts can save your life; always wear one.  Smoke/Carbon Monoxide detectors- These detectors need to be installed on the appropriate level of your home.  Replace batteries at least once a year.  Skin cancer- When out in the sun, cover up and use sunscreen 15 SPF or higher.  Violence- If anyone is threatening you, please tell your healthcare provider.  Living Will/ Health care power of attorney- Speak with your healthcare provider and family.   If you have lab work done today you will be contacted with your lab results within the next 2 weeks.  If you have not heard from [place] then please contact us. The fastest way to get your results is to register for My Chart.   IF you received an x-ray today, you will receive an invoice from [facility]  Radiology. Please contact [facility] Radiology at [phone] with questions or concerns regarding your invoice.   IF you received labwork today, you will receive an invoice from [facility]. Please contact [facility] at [phone] with questions or concerns regarding your invoice.   Our billing staff will not be able to assist you with questions regarding bills from these companies.  You will be contacted with the lab results as soon as they are available. The fastest way to get your results is to activate your My Chart account. Instructions are located on the last page of this paperwork. If you have not heard from [place] regarding the results in 2 weeks, please contact this office.         Signed, Merri Ray, MD [facility]

## 2020-05-12 LAB — COMPREHENSIVE METABOLIC PANEL
ALT: 33 IU/L (ref 0–44)
AST: 30 IU/L (ref 0–40)
Albumin/Globulin Ratio: 2.1 (ref 1.2–2.2)
Albumin: 4.6 g/dL (ref 3.8–4.9)
Alkaline Phosphatase: 49 IU/L (ref 44–121)
BUN/Creatinine Ratio: 11 (ref 9–20)
BUN: 10 mg/dL (ref 6–24)
Bilirubin Total: 0.4 mg/dL (ref 0.0–1.2)
CO2: 23 mmol/L (ref 20–29)
Calcium: 9.2 mg/dL (ref 8.7–10.2)
Chloride: 102 mmol/L (ref 96–106)
Creatinine, Ser: 0.9 mg/dL (ref 0.76–1.27)
GFR calc Af Amer: 108 mL/min/{1.73_m2} (ref >59)
GFR calc non Af Amer: 93 mL/min/{1.73_m2} (ref >59)
Globulin, Total: 2.2 g/dL (ref 1.5–4.5)
Glucose: 132 mg/dL — ABNORMAL HIGH (ref 65–99)
Potassium: 4.8 mmol/L (ref 3.5–5.2)
Sodium: 138 mmol/L (ref 134–144)
Total Protein: 6.8 g/dL (ref 6.0–8.5)

## 2020-05-12 LAB — HEMOGLOBIN A1C
Est. average glucose Bld gHb Est-mCnc: 177 mg/dL
Hgb A1c MFr Bld: 7.8 % — ABNORMAL HIGH (ref 4.8–5.6)

## 2020-05-12 LAB — CBC
Hematocrit: 44.2 % (ref 37.5–51.0)
Hemoglobin: 14.5 g/dL (ref 13.0–17.7)
MCH: 29.3 pg (ref 26.6–33.0)
MCHC: 32.8 g/dL (ref 31.5–35.7)
MCV: 89 fL (ref 79–97)
Platelets: 165 10*3/uL (ref 150–450)
RBC: 4.95 x10E6/uL (ref 4.14–5.80)
RDW: 13.2 % (ref 11.6–15.4)
WBC: 5.2 10*3/uL (ref 3.4–10.8)

## 2020-05-12 LAB — LIPID PANEL
Chol/HDL Ratio: 3.4 ratio (ref 0.0–5.0)
Cholesterol, Total: 116 mg/dL (ref 100–199)
HDL: 34 mg/dL — ABNORMAL LOW (ref >39)
LDL Chol Calc (NIH): 51 mg/dL (ref 0–99)
Triglycerides: 184 mg/dL — ABNORMAL HIGH (ref 0–149)
VLDL Cholesterol Cal: 31 mg/dL (ref 5–40)

## 2020-05-12 LAB — PSA: Prostate Specific Ag, Serum: 0.7 ng/mL (ref 0.0–4.0)

## 2020-05-16 NOTE — Progress Notes (Deleted)
HPI: FU atrial fibrillation, coronary disease, hypertension and hyperlipidemia. Patient has had previous PCI of his right coronary artery in the setting of an acute infarct in 2004. No obstructive disease in the left system. He has had atrial fibrillation and has undergone previous ablation. Nuclear study in October of 2013 showed an ejection fraction of 50%. There was no ischemia.  Echocardiogram January 2021 showed normal LV function.  Patient was in recurrent atrial fibrillation at last office visit and cardioversion arranged but he was in sinus upon arrival to the hospital.  Since last seen,   Current Outpatient Medications  Medication Sig Dispense Refill  . Boron 3 MG CAPS Take 3 mg by mouth daily.     [name] dofetilide (TIKOSYN) 500 MCG capsule TAKE 1 CAPSULE TWICE A DAY 180 capsule 1  . ELIQUIS 5 MG TABS tablet TAKE 1 TABLET TWICE A DAY 180 tablet 3  . ezetimibe (ZETIA) 10 MG tablet TAKE 1 TABLET DAILY 90 tablet 3  . Garlic 341 MG TABS Take 500 mg by mouth 2 (two) times daily.     . Ginger, Zingiber officinalis, (GINGER PO) Take 1 tablet by mouth 2 (two) times daily.     [name] HAWTHORN BERRY PO Take by mouth.    [name] lisinopril (ZESTRIL) 20 MG tablet TAKE 1 TABLET EVERY EVENING (COURTESY REFILL, NEED TO SCHEDULE APPOINTMENT FOR OFFICE VISIT) 30 tablet 11  . Magnesium 100 MG TABS Take 100 mg by mouth 2 (two) times daily.     . metFORMIN (GLUCOPHAGE) 850 MG tablet TAKE 1 TABLET (850 MG TOTAL) BY MOUTH 2 (TWO) TIMES DAILY WITH A MEAL. 180 tablet 1  . metoprolol succinate (TOPROL-XL) 25 MG 24 hr tablet Take 1 tablet (25 mg total) by mouth daily. 90 tablet 2  . Multiple Vitamins-Minerals (CENTRUM PO) Take 1 tablet by mouth daily.    . niacin (NIASPAN) 500 MG CR tablet Take 500 mg by mouth 2 (two) times daily.    . nitroGLYCERIN (NITROSTAT) 0.4 MG SL tablet Place 1 tablet (0.4 mg total) under the tongue every 5 (five) minutes as needed. For chest pain at 3rd dose call 911 25 tablet 4  . Omega-3  Fatty Acids (FISH OIL PO) Take 1,000 mg by mouth 2 (two) times daily.     . rosuvastatin (CRESTOR) 40 MG tablet TAKE 1 TABLET DAILY 90 tablet 2  . tadalafil (CIALIS) 10 MG tablet TAKE 1 TABLET DAILY AS NEEDED FOR ERECTILE DYSFUNCTION. DO NOT USE IF TAKING NITROGLYCERIN 24 tablet 4   No current facility-administered medications for this visit.     Past Medical History:  Diagnosis Date  . Atrial fibrillation ([place])    a. Persistent, s/p afib ablation by [facility] 08/16/10;  b. s/p failed DCCV 05/2011; c. Chronic Tikosyn & Pradaxa (CHA2DS2VASc = 3-4).  [name] CAD (coronary artery disease)    a. 07/2002 Inf MI s/p BMS -->RCA, otw nonobs dzs;  b. 12/2011 Neg MV.  . Cancer ([place])    Phreesia 05/09/2020  . CHF (congestive heart failure) ([place])    Phreesia 05/09/2020  . Chronic diastolic CHF (congestive heart failure) ([place])   . Diabetes ([place])   . Diabetes mellitus without complication ([place])    Phreesia 05/09/2020  . HTN (hypertension)   . Hyperlipidemia   . Hypertension    Phreesia 05/09/2020  . Myocardial infarction ([place])    Phreesia 05/09/2020  . PVC's (premature ventricular contractions)   . Sleep apnea    followed by Dr  Clance    Past Surgical History:  Procedure Laterality Date  . atrial ablatio  08/16/10   afib ablation by [facility]  . CARDIAC CATHETERIZATION  2004    Successful PTCA and stent placement in the mid right   coronary artery with an extra 99% narrowing to 0% with placement of a 3.5 x  20 mm Express 2 stent with improvement of TIMI grade 1 flow to TIMI grade 3.  . CARDIOVERSION  06/15/2011   Procedure: CARDIOVERSION;  Surgeon: Fay Records, MD;  Location: [facility];  Service: Cardiovascular;  Laterality: N/A;  . HERNIA REPAIR N/A    Phreesia 05/09/2020  . tonsilectomy      Social History   Socioeconomic History  . Marital status: Single    Spouse name: Not on file  . Number of children: 0  . Years of education: Not on file  . Highest education level: Not on file  Occupational History   . 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 Smoking status: Never Smoker  . Smokeless tobacco: Never Used  Vaping Use  . Vaping Use: Never used  Substance and Sexual Activity  . Alcohol use: Yes    Alcohol/week: 1.0 standard drink    Types: 1 Cans of beer per week    Comment:  three times a week  . Drug use: No  . Sexual activity: Yes  Other Topics Concern  . Not on file  Social History Narrative   Pt lives in [place] alone.  Single.   He is a Teacher, English as a foreign language for [employer]   Social Determinants of Health   Financial Resource Strain: Not on file  Food Insecurity: Not on file  Transportation Needs: Not on file  Physical Activity: Not on file  Stress: Not on file  Social Connections: Not on file  Intimate Partner Violence: Not on file    Family History  Problem Relation Age of Onset  . Coronary artery disease Mother   . Heart failure Mother   . Heart disease Mother   . Hyperlipidemia Mother   . Hypertension Mother   . Alzheimer's disease Father   . Diabetes Father   . Diabetes Brother   . Heart disease Brother   . Hyperlipidemia Brother   . Hypertension Brother   . Alzheimer's disease Brother   . Hyperlipidemia Brother   . Colon cancer Neg Hx   . Pancreatic cancer Neg Hx   . Rectal cancer Neg Hx   . Stomach cancer Neg Hx     ROS: no fevers or chills, productive cough, hemoptysis, dysphasia, odynophagia, melena, hematochezia, dysuria, hematuria, rash, seizure activity, orthopnea, PND, pedal edema, claudication. Remaining systems are negative.  Physical Exam: Well-developed well-nourished in no acute distress.  Skin is warm and dry.  HEENT is normal.  Neck is supple.  Chest is clear to auscultation with normal expansion.  Cardiovascular exam is regular rate and rhythm.  Abdominal exam nontender or distended. No masses palpated. Extremities show no edema. neuro grossly intact  ECG- personally  reviewed  A/P  1 paroxysmal atrial fibrillation-patient remains in sinus rhythm.  Continue Tikosyn, Toprol and apixaban.  Check potassium, magnesium, renal function and hemoglobin.  2 coronary artery disease-patient doing well with no chest pain.  Continue statin.  He is not on aspirin given need for apixaban.  3 hypertension-blood pressure controlled.  Continue present medications.  4 hyperlipidemia-continue Crestor and Zetia.  Check lipids and liver.  5 obstructive sleep apnea-continue CPAP.  Kirk Ruths, MD

## 2020-05-17 ENCOUNTER — Encounter: Payer: Self-pay | Admitting: Family Medicine

## 2020-05-30 ENCOUNTER — Ambulatory Visit: Payer: BC Managed Care – PPO | Admitting: Cardiology

## 2020-05-30 DIAGNOSIS — Z20822 Contact with and (suspected) exposure to covid-19: Secondary | ICD-10-CM | POA: Diagnosis not present

## 2020-05-30 DIAGNOSIS — Z03818 Encounter for observation for suspected exposure to other biological agents ruled out: Secondary | ICD-10-CM | POA: Diagnosis not present

## 2020-06-08 DIAGNOSIS — G4733 Obstructive sleep apnea (adult) (pediatric): Secondary | ICD-10-CM | POA: Diagnosis not present

## 2020-07-08 DIAGNOSIS — G4733 Obstructive sleep apnea (adult) (pediatric): Secondary | ICD-10-CM | POA: Diagnosis not present

## 2020-07-30 NOTE — Progress Notes (Signed)
HPI: FU atrial fibrillation, coronary disease, hypertension and hyperlipidemia. Patient has had previous PCI of his right coronary artery in the setting of an acute infarct in 2004. No obstructive disease in the left system. He has had atrial fibrillation and has undergone previous ablation. Nuclear study in October of 2013 showed an ejection fraction of 50%. There was no ischemia.  Echocardiogram January 2021 showed normal LV function.  Patient was in recurrent atrial fibrillation at last office visit and cardioversion arranged but he was in sinus upon arrival to the hospital.  Since last seen,there is no dyspnea on exertion, orthopnea, PND, pedal edema, syncope, chest pain or bleeding.  However he is having bouts of atrial fibrillation approximately 1 time monthly.  These episodes are associated with palpitations and fatigue.  He will take an extra Tikosyn to reestablish sinus rhythm after 24 hours.  He states this has been effective.  Current Outpatient Medications  Medication Sig Dispense Refill  . Boron 3 MG CAPS Take 3 mg by mouth daily.     [name] ELIQUIS 5 MG TABS tablet TAKE 1 TABLET TWICE A DAY 180 tablet 3  . ezetimibe (ZETIA) 10 MG tablet TAKE 1 TABLET DAILY 90 tablet 3  . Ginger, Zingiber officinalis, (GINGER PO) Take 1 tablet by mouth 2 (two) times daily.     [name] HAWTHORN BERRY PO Take by mouth.    [name] lisinopril (ZESTRIL) 20 MG tablet TAKE 1 TABLET EVERY EVENING (COURTESY REFILL, NEED TO SCHEDULE APPOINTMENT FOR OFFICE VISIT) 30 tablet 11  . Magnesium 100 MG TABS Take 100 mg by mouth 2 (two) times daily.     . metFORMIN (GLUCOPHAGE) 850 MG tablet TAKE 1 TABLET (850 MG TOTAL) BY MOUTH 2 (TWO) TIMES DAILY WITH A MEAL. 180 tablet 1  . metoprolol succinate (TOPROL-XL) 25 MG 24 hr tablet TAKE 1 TABLET DAILY 90 tablet 1  . Multiple Vitamins-Minerals (CENTRUM PO) Take 1 tablet by mouth daily.    . niacin (NIASPAN) 500 MG CR tablet Take 500 mg by mouth 2 (two) times daily.    . nitroGLYCERIN  (NITROSTAT) 0.4 MG SL tablet Place 1 tablet (0.4 mg total) under the tongue every 5 (five) minutes as needed. For chest pain at 3rd dose call 911 25 tablet 4  . Omega-3 Fatty Acids (FISH OIL PO) Take 1,000 mg by mouth 2 (two) times daily.     . rosuvastatin (CRESTOR) 40 MG tablet TAKE 1 TABLET DAILY 90 tablet 2  . tadalafil (CIALIS) 10 MG tablet TAKE 1 TABLET DAILY AS NEEDED FOR ERECTILE DYSFUNCTION. DO NOT USE IF TAKING NITROGLYCERIN 24 tablet 4  . dofetilide (TIKOSYN) 500 MCG capsule TAKE 1 CAPSULE TWICE A DAY (Patient not taking: Reported on 08/09/2020) 180 capsule 1   No current facility-administered medications for this visit.     Past Medical History:  Diagnosis Date  . Atrial fibrillation ([place])    a. Persistent, s/p afib ablation by [facility] 08/16/10;  b. s/p failed DCCV 05/2011; c. Chronic Tikosyn & Pradaxa (CHA2DS2VASc = 3-4).  [name] CAD (coronary artery disease)    a. 07/2002 Inf MI s/p BMS -->RCA, otw nonobs dzs;  b. 12/2011 Neg MV.  . Cancer ([place])    Phreesia 05/09/2020  . CHF (congestive heart failure) ([place])    Phreesia 05/09/2020  . Chronic diastolic CHF (congestive heart failure) ([place])   . Diabetes ([place])   . Diabetes mellitus without complication ([place])    Phreesia 05/09/2020  . HTN (hypertension)   .  Hyperlipidemia   . Hypertension    Phreesia 05/09/2020  . Myocardial infarction ([place])    Phreesia 05/09/2020  . PVC's (premature ventricular contractions)   . Sleep apnea    followed by Dr Gwenette Greet    Past Surgical History:  Procedure Laterality Date  . atrial ablatio  08/16/10   afib ablation by [facility]  . CARDIAC CATHETERIZATION  2004    Successful PTCA and stent placement in the mid right   coronary artery with an extra 99% narrowing to 0% with placement of a 3.5 x  20 mm Express 2 stent with improvement of TIMI grade 1 flow to TIMI grade 3.  . CARDIOVERSION  06/15/2011   Procedure: CARDIOVERSION;  Surgeon: Fay Records, MD;  Location: [facility];  Service: Cardiovascular;  Laterality:  N/A;  . HERNIA REPAIR N/A    Phreesia 05/09/2020  . tonsilectomy      Social History   Socioeconomic History  . Marital status: Single    Spouse name: Not on file  . Number of children: 0  . Years of education: Not on file  . Highest education level: Not on file  Occupational History  . 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 Smoking status: Never Smoker  . Smokeless tobacco: Never Used  Vaping Use  . Vaping Use: Never used  Substance and Sexual Activity  . Alcohol use: Yes    Alcohol/week: 1.0 standard drink    Types: 1 Cans of beer per week    Comment:  three times a week  . Drug use: No  . Sexual activity: Yes  Other Topics Concern  . Not on file  Social History Narrative   Pt lives in [place] alone.  Single.   He is a Teacher, English as a foreign language for [employer]   Social Determinants of Health   Financial Resource Strain: Not on file  Food Insecurity: Not on file  Transportation Needs: Not on file  Physical Activity: Not on file  Stress: Not on file  Social Connections: Not on file  Intimate Partner Violence: Not on file    Family History  Problem Relation Age of Onset  . Coronary artery disease Mother   . Heart failure Mother   . Heart disease Mother   . Hyperlipidemia Mother   . Hypertension Mother   . Alzheimer's disease Father   . Diabetes Father   . Diabetes Brother   . Heart disease Brother   . Hyperlipidemia Brother   . Hypertension Brother   . Alzheimer's disease Brother   . Hyperlipidemia Brother   . Colon cancer Neg Hx   . Pancreatic cancer Neg Hx   . Rectal cancer Neg Hx   . Stomach cancer Neg Hx     ROS: no fevers or chills, productive cough, hemoptysis, dysphasia, odynophagia, melena, hematochezia, dysuria, hematuria, rash, seizure activity, orthopnea, PND, pedal edema, claudication. Remaining systems are negative.  Physical Exam: Well-developed well-nourished in no acute distress.  Skin is  warm and dry.  HEENT is normal.  Neck is supple.  Chest is clear to auscultation with normal expansion.  Cardiovascular exam is regular rate and rhythm.  Abdominal exam nontender or distended. No masses palpated. Extremities show no edema. neuro grossly intact  ECG-normal sinus rhythm at a rate of 64, normal QT interval.  Personally reviewed  A/P  1 paroxysmal atrial fibrillation-patient remains in sinus rhythm.  We will continue present dose of Tikosyn, Toprol and apixaban.  Check renal function and magnesium.  He is having increasing frequency of atrial fibrillation with bouts occurring monthly on average.  He is symptomatic with these.  He is terminating these episodes with an additional Tikosyn.  I have asked him to avoid this due to risk of ventricular arrhythmias.  I will arrange for him to see Dr. Rayann Heman for consideration of other therapies including repeat ablation.  2 coronary artery disease-patient denies chest pain.  Continue statin.  He is not on aspirin given need for anticoagulation.  3 hypertension-blood pressure controlled.  Continue present medications and follow.  4 hyperlipidemia-continue present medications.  Check lipids and liver.  5 obstructive sleep apnea-continue CPAP.  Kirk Ruths, MD

## 2020-08-02 ENCOUNTER — Other Ambulatory Visit: Payer: Self-pay | Admitting: Family Medicine

## 2020-08-02 DIAGNOSIS — I10 Essential (primary) hypertension: Secondary | ICD-10-CM

## 2020-08-08 DIAGNOSIS — L905 Scar conditions and fibrosis of skin: Secondary | ICD-10-CM | POA: Diagnosis not present

## 2020-08-08 DIAGNOSIS — Z85820 Personal history of malignant melanoma of skin: Secondary | ICD-10-CM | POA: Diagnosis not present

## 2020-08-08 DIAGNOSIS — D1801 Hemangioma of skin and subcutaneous tissue: Secondary | ICD-10-CM | POA: Diagnosis not present

## 2020-08-08 DIAGNOSIS — G4733 Obstructive sleep apnea (adult) (pediatric): Secondary | ICD-10-CM | POA: Diagnosis not present

## 2020-08-08 DIAGNOSIS — L570 Actinic keratosis: Secondary | ICD-10-CM | POA: Diagnosis not present

## 2020-08-09 ENCOUNTER — Ambulatory Visit (INDEPENDENT_AMBULATORY_CARE_PROVIDER_SITE_OTHER): Payer: BC Managed Care – PPO | Admitting: Cardiology

## 2020-08-09 ENCOUNTER — Encounter: Payer: Self-pay | Admitting: Cardiology

## 2020-08-09 ENCOUNTER — Other Ambulatory Visit: Payer: Self-pay

## 2020-08-09 VITALS — BP 104/70 | HR 64 | Ht 71.0 in | Wt 210.0 lb

## 2020-08-09 DIAGNOSIS — I480 Paroxysmal atrial fibrillation: Secondary | ICD-10-CM | POA: Diagnosis not present

## 2020-08-09 DIAGNOSIS — I10 Essential (primary) hypertension: Secondary | ICD-10-CM | POA: Diagnosis not present

## 2020-08-09 DIAGNOSIS — E7800 Pure hypercholesterolemia, unspecified: Secondary | ICD-10-CM | POA: Diagnosis not present

## 2020-08-09 DIAGNOSIS — I2510 Atherosclerotic heart disease of native coronary artery without angina pectoris: Secondary | ICD-10-CM

## 2020-08-09 LAB — BASIC METABOLIC PANEL
BUN/Creatinine Ratio: 12 (ref 10–24)
BUN: 12 mg/dL (ref 8–27)
CO2: 23 mmol/L (ref 20–29)
Calcium: 9.6 mg/dL (ref 8.6–10.2)
Chloride: 98 mmol/L (ref 96–106)
Creatinine, Ser: 0.99 mg/dL (ref 0.76–1.27)
Glucose: 150 mg/dL — ABNORMAL HIGH (ref 65–99)
Potassium: 4.8 mmol/L (ref 3.5–5.2)
Sodium: 137 mmol/L (ref 134–144)
eGFR: 87 mL/min/{1.73_m2} (ref >59)

## 2020-08-09 LAB — MAGNESIUM: Magnesium: 2 mg/dL (ref 1.6–2.3)

## 2020-08-09 NOTE — Patient Instructions (Signed)

## 2020-08-16 ENCOUNTER — Encounter: Payer: Self-pay | Admitting: *Deleted

## 2020-09-07 DIAGNOSIS — G4733 Obstructive sleep apnea (adult) (pediatric): Secondary | ICD-10-CM | POA: Diagnosis not present

## 2020-10-07 DIAGNOSIS — G4733 Obstructive sleep apnea (adult) (pediatric): Secondary | ICD-10-CM | POA: Diagnosis not present

## 2020-10-12 ENCOUNTER — Other Ambulatory Visit: Payer: Self-pay

## 2020-10-12 ENCOUNTER — Encounter: Payer: Self-pay | Admitting: *Deleted

## 2020-10-12 ENCOUNTER — Other Ambulatory Visit: Payer: Self-pay | Admitting: Cardiology

## 2020-10-12 ENCOUNTER — Ambulatory Visit (INDEPENDENT_AMBULATORY_CARE_PROVIDER_SITE_OTHER): Payer: BC Managed Care – PPO | Admitting: Internal Medicine

## 2020-10-12 ENCOUNTER — Encounter: Payer: Self-pay | Admitting: Internal Medicine

## 2020-10-12 VITALS — BP 122/78 | HR 70 | Ht 71.5 in | Wt 209.0 lb

## 2020-10-12 DIAGNOSIS — E7800 Pure hypercholesterolemia, unspecified: Secondary | ICD-10-CM

## 2020-10-12 DIAGNOSIS — I2510 Atherosclerotic heart disease of native coronary artery without angina pectoris: Secondary | ICD-10-CM

## 2020-10-12 DIAGNOSIS — I10 Essential (primary) hypertension: Secondary | ICD-10-CM | POA: Diagnosis not present

## 2020-10-12 DIAGNOSIS — I480 Paroxysmal atrial fibrillation: Secondary | ICD-10-CM | POA: Diagnosis not present

## 2020-10-12 DIAGNOSIS — D6869 Other thrombophilia: Secondary | ICD-10-CM

## 2020-10-12 NOTE — Progress Notes (Signed)
Electrophysiology Office Note   Date:  10/12/2020   ID:  Kaelen Moak Wendling, DOB 1960-06-28, MRN [id number]  PCP:  [name], MD  Cardiologist:  Dr Stanford Breed Primary Electrophysiologist: Thompson Grayer, MD    CC: afib   History of Present Illness: Patryk Wilks Michalski is a 60 y.o. male who presents today for electrophysiology evaluation.   He is referred by Dr Stanford Breed.  I have seen the patient previously but not since 2013.  He underwent PVI by me in 2012.  He did very well initially following ablation but remained on tikosyn.  Unfortunately, his afib has returned. + palpitations and fatigue  Today, he denies symptoms of palpitations, chest pain, shortness of breath, orthopnea, PND, lower extremity edema, claudication, dizziness, presyncope, syncope, bleeding, or neurologic sequela. The patient is tolerating medications without difficulties and is otherwise without complaint today.    Past Medical History:  Diagnosis Date   Atrial fibrillation ([place])    a. Persistent, s/p afib ablation by [facility] 08/16/10;  b. s/p failed DCCV 05/2011; c. Chronic Tikosyn & Pradaxa (CHA2DS2VASc = 3-4).   CAD (coronary artery disease)    a. 07/2002 Inf MI s/p BMS -->RCA, otw nonobs dzs;  b. 12/2011 Neg MV.   Cancer ([place])    Phreesia 05/09/2020   CHF (congestive heart failure) ([place])    Phreesia 05/09/2020   Chronic diastolic CHF (congestive heart failure) ([facility])    Diabetes ([place])    Diabetes mellitus without complication ([place])    Phreesia 05/09/2020   HTN (hypertension)    Hyperlipidemia    Hypertension    Phreesia 05/09/2020   Myocardial infarction ([place])    Phreesia 05/09/2020   PVC's (premature ventricular contractions)    Sleep apnea    followed by Dr Gwenette Greet   Past Surgical History:  Procedure Laterality Date   atrial ablatio  08/16/10   afib ablation by [facility]  2004    Successful PTCA and stent placement in the mid right   coronary artery with an extra 99% narrowing to  0% with placement of a 3.5 x  20 mm Express 2 stent with improvement of TIMI grade 1 flow to TIMI grade 3.   CARDIOVERSION  06/15/2011   Procedure: CARDIOVERSION;  Surgeon: Fay Records, MD;  Location: [facility];  Service: Cardiovascular;  Laterality: N/A;   HERNIA REPAIR N/A    Phreesia 05/09/2020   tonsilectomy       Current Outpatient Medications  Medication Sig Dispense Refill   Boron 3 MG CAPS Take 3 mg by mouth daily.      dofetilide (TIKOSYN) 500 MCG capsule TAKE 1 CAPSULE TWICE A DAY 180 capsule 1   ELIQUIS 5 MG TABS tablet TAKE 1 TABLET TWICE A DAY 180 tablet 3   ezetimibe (ZETIA) 10 MG tablet TAKE 1 TABLET DAILY 90 tablet 3   Ginger, Zingiber officinalis, (GINGER PO) Take 1 tablet by mouth 2 (two) times daily.      HAWTHORN BERRY PO Take by mouth.     lisinopril (ZESTRIL) 20 MG tablet TAKE 1 TABLET EVERY EVENING (COURTESY REFILL, NEED TO SCHEDULE APPOINTMENT FOR OFFICE VISIT) 30 tablet 11   Magnesium 100 MG TABS Take 100 mg by mouth 2 (two) times daily.      metFORMIN (GLUCOPHAGE) 850 MG tablet TAKE 1 TABLET (850 MG TOTAL) BY MOUTH 2 (TWO) TIMES DAILY WITH A MEAL. 180 tablet 1   metoprolol succinate (TOPROL-XL) 25 MG 24 hr tablet TAKE 1  TABLET DAILY 90 tablet 1   Multiple Vitamins-Minerals (CENTRUM PO) Take 1 tablet by mouth daily.     niacin (NIASPAN) 500 MG CR tablet Take 500 mg by mouth 2 (two) times daily.     nitroGLYCERIN (NITROSTAT) 0.4 MG SL tablet Place 1 tablet (0.4 mg total) under the tongue every 5 (five) minutes as needed. For chest pain at 3rd dose call 911 25 tablet 4   Omega-3 Fatty Acids (FISH OIL PO) Take 1,000 mg by mouth 2 (two) times daily.      rosuvastatin (CRESTOR) 40 MG tablet TAKE 1 TABLET DAILY 90 tablet 2   tadalafil (CIALIS) 10 MG tablet TAKE 1 TABLET DAILY AS NEEDED FOR ERECTILE DYSFUNCTION. DO NOT USE IF TAKING NITROGLYCERIN 24 tablet 4   No current facility-administered medications for this visit.    Allergies:   Patient has no known allergies.    Social History:  The patient  reports that he has never smoked. He has never used smokeless tobacco. He reports current alcohol use of about 1.0 standard drink of alcohol per week. He reports that he does not use drugs.   Family History:  The patient's family history includes Alzheimer's disease in his brother and father; Coronary artery disease in his mother; Diabetes in his brother and father; Heart disease in his brother and mother; Heart failure in his mother; Hyperlipidemia in his brother, brother, and mother; Hypertension in his brother and mother.    ROS:  Please see the history of present illness.   All other systems are personally reviewed and negative.    PHYSICAL EXAM: VS:  BP 122/78   Pulse 70   Ht 5' 11.5" (1.816 m)   Wt 209 lb (94.8 kg)   SpO2 96%   BMI 28.74 kg/m  , BMI Body mass index is 28.74 kg/m. GEN: Well nourished, well developed, in no acute distress HEENT: normal Neck: no JVD, carotid bruits, or masses Cardiac: RRR; no murmurs, rubs, or gallops,no edema  Respiratory:  clear to auscultation bilaterally, normal work of breathing GI: soft, nontender, nondistended, + BS MS: no deformity or atrophy Skin: warm and dry  Neuro:  Strength and sensation are intact Psych: euthymic mood, full affect  EKG:  EKG is ordered today. The ekg ordered today is personally reviewed and shows sinus   Recent Labs: 05/11/2020: ALT 33; Hemoglobin 14.5; Platelets 165 08/09/2020: BUN 12; Creatinine, Ser 0.99; Magnesium 2.0; Potassium 4.8; Sodium 137  personally reviewed   Lipid Panel     Component Value Date/Time   CHOL 116 05/11/2020 0858   TRIG 184 (H) 05/11/2020 0858   HDL 34 (L) 05/11/2020 0858   CHOLHDL 3.4 05/11/2020 0858   CHOLHDL 6.2 (H) 11/17/2015 0828   VLDL 61 (H) 11/17/2015 0828   LDLCALC 51 05/11/2020 0858   LDLDIRECT 92.8 11/04/2012 1010   personally reviewed   Wt Readings from Last 3 Encounters:  10/12/20 209 lb (94.8 kg)  08/09/20 210 lb (95.3 kg)   05/11/20 205 lb (93 kg)      Other studies personally reviewed: Additional studies/ records that were reviewed today include: Dr Lonia Skinner notes, my prior notes, echo  Review of the above records today demonstrates: as above  Echo 04/02/19- EF 55%, normal atrial size   ASSESSMENT AND PLAN:  1.  Paroxysmal atrial fibrillation The patient has symptomatic, recurrent  atrial fibrillation. he has failed medical therapy with tikosyn. Chads2vasc score is 3.  he is anticoagulated with eliquis . Therapeutic strategies for afib  including medicine and ablation were discussed in detail with the patient today. Risk, benefits, and alternatives to EP study and radiofrequency ablation for afib were also discussed in detail today. These risks include but are not limited to stroke, bleeding, vascular damage, tamponade, perforation, damage to the esophagus, lungs, and other structures, pulmonary vein stenosis, worsening renal function, and death. The patient understands these risk and wishes to proceed.  We will therefore proceed with catheter ablation at the next available time.  Carto, ICE, anesthesia are requested for the procedure.  Will also obtain cardiac CT prior to the procedure to exclude LAA thrombus and further evaluate atrial anatomy.  2. HTN Continue toprol '25mg'$  daily  3. HL Continue crestor '40mg'$  daly  4. CAD No ischemic symptoms No changes   Risks, benefits and potential toxicities for medications prescribed and/or refilled reviewed with patient today.      Army Fossa, MD  10/12/2020 10:43 AM     [facility] [address] [place] [address] [phone] (office) [phone] (fax)

## 2020-10-12 NOTE — Patient Instructions (Addendum)
Medication Instructions:  Your physician recommends that you continue on your current medications as directed. Please refer to the Current Medication list given to you today.  Labwork: None ordered.  Testing/Procedures: Your physician has requested that you have cardiac CT. Cardiac computed tomography (CT) is a painless test that uses an x-ray machine to take clear, detailed pictures of your heart. For further information please visit [facility]. Please follow instruction sheet as given.   Your physician has recommended that you have an ablation. Catheter ablation is a medical procedure used to treat some cardiac arrhythmias (irregular heartbeats). During catheter ablation, a long, thin, flexible tube is put into a blood vessel in your groin (upper thigh), or neck. This tube is called an ablation catheter. It is then guided to your heart through the blood vessel. Radio frequency waves destroy small areas of heart tissue where abnormal heartbeats may cause an arrhythmia to start. Please see the instruction sheet given to you today.    Any Other Special Instructions Will Be Listed Below (If Applicable).  If you need a refill on your cardiac medications before your next appointment, please call your pharmacy.   Cardiac Ablation Cardiac ablation is a procedure to destroy (ablate) some heart tissue that is sending bad signals. These bad signals causeproblems in heart rhythm. The heart has many areas that make these signals. If there are problems in these areas, they can make the heart beat in a way that is not normal.Destroying some tissues can help make the heart rhythm normal. Tell your doctor about: Any allergies you have. All medicines you are taking. These include vitamins, herbs, eye drops, creams, and over-the-counter medicines. Any problems you or family members have had with medicines that make you fall asleep (anesthetics). Any blood disorders you have. Any surgeries you have  had. Any medical conditions you have, such as kidney failure. Whether you are pregnant or may be pregnant. What are the risks? This is a safe procedure. But problems may occur, including: Infection. Bruising and bleeding. Bleeding into the chest. Stroke or blood clots. Damage to nearby areas of your body. Allergies to medicines or dyes. The need for a pacemaker if the normal system is damaged. Failure of the procedure to treat the problem. What happens before the procedure? Medicines Ask your doctor about: Changing or stopping your normal medicines. This is important. Taking aspirin and ibuprofen. Do not take these medicines unless your doctor tells you to take them. Taking other medicines, vitamins, herbs, and supplements. General instructions Follow instructions from your doctor about what you cannot eat or drink. Plan to have someone take you home from the hospital or clinic. If you will be going home right after the procedure, plan to have someone with you for 24 hours. Ask your doctor what steps will be taken to prevent infection. What happens during the procedure?  An IV tube will be put into one of your veins. You will be given a medicine to help you relax. The skin on your neck or groin will be numbed. A cut (incision) will be made in your neck or groin. A needle will be put through your cut and into a large vein. A tube (catheter) will be put into the needle. The tube will be moved to your heart. Dye may be put through the tube. This helps your doctor see your heart. Small devices (electrodes) on the tube will send out signals. A type of energy will be used to destroy some heart tissue. The tube  will be taken out. Pressure will be held on your cut. This helps stop bleeding. A bandage will be put over your cut. The exact procedure may vary among doctors and hospitals. What happens after the procedure? You will be watched until you leave the hospital or clinic. This  includes checking your heart rate, breathing rate, oxygen, and blood pressure. Your cut will be watched for bleeding. You will need to lie still for a few hours. Do not drive for 24 hours or as long as your doctor tells you. Summary Cardiac ablation is a procedure to destroy some heart tissue. This is done to treat heart rhythm problems. Tell your doctor about any medical conditions you may have. Tell him or her about all medicines you are taking to treat them. This is a safe procedure. But problems may occur. These include infection, bruising, bleeding, and damage to nearby areas of your body. Follow what your doctor tells you about food and drink. You may also be told to change or stop some of your medicines. After the procedure, do not drive for 24 hours or as long as your doctor tells you. This information is not intended to replace advice given to you by your health care provider. Make sure you discuss any questions you have with your healthcare provider. Document Revised: 02/05/2019 Document Reviewed: 02/05/2019 Elsevier Patient Education  2022 [employer].

## 2020-10-23 ENCOUNTER — Other Ambulatory Visit: Payer: Self-pay | Admitting: Cardiology

## 2020-10-23 DIAGNOSIS — I10 Essential (primary) hypertension: Secondary | ICD-10-CM

## 2020-11-07 DIAGNOSIS — G4733 Obstructive sleep apnea (adult) (pediatric): Secondary | ICD-10-CM | POA: Diagnosis not present

## 2020-11-09 ENCOUNTER — Ambulatory Visit: Payer: BC Managed Care – PPO | Admitting: Family Medicine

## 2020-11-09 ENCOUNTER — Other Ambulatory Visit: Payer: Self-pay

## 2020-11-09 VITALS — BP 136/74 | HR 60 | Temp 98.0°F | Resp 16 | Ht 71.5 in | Wt 210.4 lb

## 2020-11-09 DIAGNOSIS — E785 Hyperlipidemia, unspecified: Secondary | ICD-10-CM

## 2020-11-09 DIAGNOSIS — I4891 Unspecified atrial fibrillation: Secondary | ICD-10-CM | POA: Diagnosis not present

## 2020-11-09 DIAGNOSIS — I10 Essential (primary) hypertension: Secondary | ICD-10-CM | POA: Diagnosis not present

## 2020-11-09 DIAGNOSIS — G4733 Obstructive sleep apnea (adult) (pediatric): Secondary | ICD-10-CM

## 2020-11-09 DIAGNOSIS — E119 Type 2 diabetes mellitus without complications: Secondary | ICD-10-CM

## 2020-11-09 DIAGNOSIS — E1165 Type 2 diabetes mellitus with hyperglycemia: Secondary | ICD-10-CM

## 2020-11-09 DIAGNOSIS — Z9989 Dependence on other enabling machines and devices: Secondary | ICD-10-CM

## 2020-11-09 LAB — MICROALBUMIN / CREATININE URINE RATIO
Creatinine,U: 30.9 mg/dL
Microalb Creat Ratio: 2.3 mg/g (ref 0.0–30.0)
Microalb, Ur: <0.7 mg/dL (ref 0.0–1.9)

## 2020-11-09 LAB — LIPID PANEL
Cholesterol: 119 mg/dL (ref 0–200)
HDL: 36.2 mg/dL — ABNORMAL LOW (ref >39.00)
NonHDL: 82.59
Total CHOL/HDL Ratio: 3
Triglycerides: 212 mg/dL — ABNORMAL HIGH (ref 0.0–149.0)
VLDL: 42.4 mg/dL — ABNORMAL HIGH (ref 0.0–40.0)

## 2020-11-09 LAB — LDL CHOLESTEROL, DIRECT: Direct LDL: 65 mg/dL

## 2020-11-09 LAB — HEMOGLOBIN A1C: Hgb A1c MFr Bld: 7.9 % — ABNORMAL HIGH (ref 4.6–6.5)

## 2020-11-09 MED ORDER — ROSUVASTATIN CALCIUM 40 MG PO TABS: 40.0000 mg | ORAL_TABLET | Freq: Every day | ORAL | 2 refills | Status: DC

## 2020-11-09 MED ORDER — METFORMIN HCL 850 MG PO TABS: ORAL_TABLET | ORAL | 1 refills | Status: DC

## 2020-11-09 NOTE — Progress Notes (Signed)
Subjective:  Patient ID: Scott White, male    DOB: 15-May-1960  Age: 60 y.o. MRN: [id number]  CC:  Chief Complaint  Patient presents with   Diabetes    Pt reports no concerns, reports did not take at home A1c test has been working on diet last 3 months    Hyperlipidemia    Pt due for recheck no concerns   Hypertension    Pt reports home BP okay 120-130/80 pt denies physical sxs.     HPI Scott White presents for   Diabetes: With hyperglycemia, CAD.  Metformin 850 mg twice per day at last visit, he does take ACE inhibitor, statin, Zetia.  Some decreased exercise, diet adherence last visit, he planned on making some changes in diet, exercise, and home monitoring of A1c.  Option of increasing metformin to 1000 mg twice daily. Did not do A1c. Back in gym and better diet past 3 months.  Random readings - usually after certain meals - up to 180.  No symptomatic lows.   Microalbumin: Normal ratio 01/2019 Optho, foot exam, pneumovax: Up-to-date Wt Readings from Last 3 Encounters:  11/09/20 210 lb 6.4 oz (95.4 kg)  10/12/20 209 lb (94.8 kg)  08/09/20 210 lb (95.3 kg)    Lab Results  Component Value Date   HGBA1C 7.8 (H) 05/11/2020   HGBA1C 7.2 (H) 11/06/2019   HGBA1C 6.6 (H) 02/09/2019   Lab Results  Component Value Date   MICROALBUR <0.2 03/07/2015   LDLCALC 51 05/11/2020   CREATININE 0.99 08/09/2020    Hypertension: With CAD, A. fib, sleep apnea.  Plan for atrial fibrillation ablation in September, Dr. Rayann Heman.  Cardiologist Dr. Stanford Breed.  Anticoagulated with Eliquis, rate control with Toprol.  Rhythm control previously with Tikosyn.  Toprol-XL 25 mg daily.  Lisinopril 20 mg daily. Home readings: 120-130/80-90. No new side effects. No new bleeding.  Using CPAP, well rested.  BP Readings from Last 3 Encounters:  11/09/20 136/74  10/12/20 122/78  08/09/20 104/70   Lab Results  Component Value Date   CREATININE 0.99 08/09/2020   Hyperlipidemia: Crestor 40  mg daily, Zetia 10 mg daily. Daily use, no new myalgias/side effects.  Lab Results  Component Value Date   CHOL 116 05/11/2020   HDL 34 (L) 05/11/2020   LDLCALC 51 05/11/2020   LDLDIRECT 92.8 11/04/2012   TRIG 184 (H) 05/11/2020   CHOLHDL 3.4 05/11/2020   Lab Results  Component Value Date   ALT 33 05/11/2020   AST 30 05/11/2020   ALKPHOS 49 05/11/2020   BILITOT 0.4 05/11/2020   HM: Shingrix at pharmacy x 2. Gave to self (from express Rx).  Covid 2nd booster recommended.    History Patient Active Problem List   Diagnosis Date Noted   Complex sleep apnea syndrome 09/22/2010   Long term (current) use of anticoagulants 07/03/2010   Long term current use of anticoagulant 05/31/2010   DYSLIPIDEMIA 09/27/2008   HYPERTRIGLYCERIDEMIA 02/11/2008   ESSENTIAL HYPERTENSION, BENIGN 02/11/2008   CORONARY ATHEROSCLEROSIS NATIVE CORONARY ARTERY 02/11/2008   ATRIAL FIBRILLATION 02/11/2008   Past Medical History:  Diagnosis Date   Atrial fibrillation ([place])    a. Persistent, s/p afib ablation by [facility] 08/16/10;  b. s/p failed DCCV 05/2011; c. Chronic Tikosyn & Pradaxa (CHA2DS2VASc = 3-4).   CAD (coronary artery disease)    a. 07/2002 Inf MI s/p BMS -->RCA, otw nonobs dzs;  b. 12/2011 Neg MV.   Cancer [facility])    Phreesia 05/09/2020   CHF (  congestive heart failure) ([place])    Phreesia 05/09/2020   Chronic diastolic CHF (congestive heart failure) (HCC)    Diabetes ([place])    Diabetes mellitus without complication ([place])    Phreesia 05/09/2020   HTN (hypertension)    Hyperlipidemia    Hypertension    Phreesia 05/09/2020   Myocardial infarction ([place])    Phreesia 05/09/2020   PVC's (premature ventricular contractions)    Sleep apnea    followed by Dr Gwenette Greet   Past Surgical History:  Procedure Laterality Date   atrial ablatio  08/16/10   afib ablation by [place]  2004    Successful PTCA and stent placement in the mid right   coronary artery with an extra 99% narrowing to  0% with placement of a 3.5 x  20 mm Express 2 stent with improvement of TIMI grade 1 flow to TIMI grade 3.   CARDIOVERSION  06/15/2011   Procedure: CARDIOVERSION;  Surgeon: Fay Records, MD;  Location: [facility];  Service: Cardiovascular;  Laterality: N/A;   HERNIA REPAIR N/A    Phreesia 05/09/2020   tonsilectomy     No Known Allergies Prior to Admission medications   Medication Sig Start Date End Date Taking? Authorizing Provider  Boron 3 MG CAPS Take 3 mg by mouth daily.    Yes [provider]  dofetilide (TIKOSYN) 500 MCG capsule TAKE 1 CAPSULE TWICE A DAY (SCHEDULE APPOINTMENT FOR FUTURE REFILLS) 10/12/20  Yes Crenshaw, Denice Bors, MD  ELIQUIS 5 MG TABS tablet TAKE 1 TABLET TWICE A DAY 12/16/19  Yes Lelon Perla, MD  ezetimibe (ZETIA) 10 MG tablet TAKE 1 TABLET DAILY 03/04/20  Yes Lelon Perla, MD  Ginger, Zingiber officinalis, (GINGER PO) Take 1 tablet by mouth 2 (two) times daily.    Yes [provider]  HAWTHORN BERRY PO Take by mouth.   Yes [provider]  lisinopril (ZESTRIL) 20 MG tablet TAKE 1 TABLET BY MOUTH EVERY DAY IN THE EVENING 10/24/20  Yes Crenshaw, Denice Bors, MD  Magnesium 100 MG TABS Take 100 mg by mouth 2 (two) times daily.    Yes [provider]  metFORMIN (GLUCOPHAGE) 850 MG tablet TAKE 1 TABLET (850 MG TOTAL) BY MOUTH 2 (TWO) TIMES DAILY WITH A MEAL. 05/11/20  Yes [name], MD  metoprolol succinate (TOPROL-XL) 25 MG 24 hr tablet TAKE 1 TABLET DAILY 08/02/20  Yes [name], MD  Multiple Vitamins-Minerals (CENTRUM PO) Take 1 tablet by mouth daily.   Yes [provider]  niacin (NIASPAN) 500 MG CR tablet Take 500 mg by mouth 2 (two) times daily.   Yes [provider]  nitroGLYCERIN (NITROSTAT) 0.4 MG SL tablet Place 1 tablet (0.4 mg total) under the tongue every 5 (five) minutes as needed. For chest pain at 3rd dose call 911 06/09/19  Yes Crenshaw, Denice Bors, MD  Omega-3 Fatty Acids (FISH OIL PO) Take 1,000 mg  by mouth 2 (two) times daily.    Yes [provider]  rosuvastatin (CRESTOR) 40 MG tablet TAKE 1 TABLET DAILY 04/07/20  Yes [name], MD  tadalafil (CIALIS) 10 MG tablet TAKE 1 TABLET DAILY AS NEEDED FOR ERECTILE DYSFUNCTION. DO NOT USE IF TAKING NITROGLYCERIN 05/11/20  Yes [name], MD   Social History   Socioeconomic History   Marital status: Single    Spouse name: Not on file   Number of children: 0   Years of education: Not on file  Highest education level: Not on file  Occupational History   Occupation: Pharmacist, community: [employer]  Tobacco Use   Smoking status: Never   Smokeless tobacco: Never  Vaping Use   Vaping Use: Never used  Substance and Sexual Activity   Alcohol use: Yes    Alcohol/week: 1.0 standard drink    Types: 1 Cans of beer per week    Comment:  three times a week   Drug use: No   Sexual activity: Yes  Other Topics Concern   Not on file  Social History Narrative   Pt lives in [place] alone.  Single.   He is a Teacher, English as a foreign language for [employer]   Social Determinants of Health   Financial Resource Strain: Not on file  Food Insecurity: Not on file  Transportation Needs: Not on file  Physical Activity: Not on file  Stress: Not on file  Social Connections: Not on file  Intimate Partner Violence: Not on file    Review of Systems  Constitutional:  Negative for fatigue and unexpected weight change.  Eyes:  Negative for visual disturbance.  Respiratory:  Negative for cough, chest tightness and shortness of breath.   Cardiovascular:  Negative for chest pain, palpitations and leg swelling.  Gastrointestinal:  Negative for abdominal pain and blood in stool.  Neurological:  Negative for dizziness, light-headedness and headaches.    Objective:   Vitals:   11/09/20 0802  BP: 136/74  Pulse: 60  Resp: 16  Temp: 98 F (36.7 C)  TempSrc: Temporal  SpO2: 98%  Weight: 210 lb 6.4 oz (95.4 kg)   Height: 5' 11.5" (1.816 m)     Physical Exam Vitals reviewed.  Constitutional:      Appearance: He is well-developed.  HENT:     Head: Normocephalic and atraumatic.  Neck:     Vascular: No carotid bruit or JVD.  Cardiovascular:     Rate and Rhythm: Normal rate and regular rhythm.     Heart sounds: Normal heart sounds. No murmur heard. Pulmonary:     Effort: Pulmonary effort is normal.     Breath sounds: Normal breath sounds. No rales.  Musculoskeletal:     Right lower leg: No edema.     Left lower leg: No edema.  Skin:    General: Skin is warm and dry.  Neurological:     Mental Status: He is alert and oriented to person, place, and time.  Psychiatric:        Mood and Affect: Mood normal.       Assessment & Plan:  ARIN PERAL is a 60 y.o. male . Type 2 diabetes mellitus with hyperglycemia, without long-term current use of insulin (HCC) - Plan: Hemoglobin A1c, metFORMIN (GLUCOPHAGE) 850 MG tablet, Microalbumin / creatinine urine ratio  -Commended on diet/exercise changes.  Check A1c, consider increase metformin to 1000 mg but will remain a 50 mg twice daily for now.  Continue ACE, statin, cardiology follow-up.  Hyperlipidemia, unspecified hyperlipidemia type - Plan: Lipid panel, rosuvastatin (CRESTOR) 40 MG tablet  -Tolerating current regimen, continue combo of Crestor, Zetia.  Check lipids  Essential hypertension  -Stable with Toprol, lisinopril, continue same.  Check labs.  Atrial fibrillation, unspecified type ([place])  -Planned ablation as above, sounds to be in sinus rhythm in office today.  Continue follow-up with cardiology, electrophysiology.  OSA on CPAP  -Compliant, continue CPAP.  Meds ordered this encounter  Medications   metFORMIN (GLUCOPHAGE) 850 MG  tablet    Sig: TAKE 1 TABLET (850 MG TOTAL) BY MOUTH 2 (TWO) TIMES DAILY WITH A MEAL.    Dispense:  180 tablet    Refill:  1   rosuvastatin (CRESTOR) 40 MG tablet    Sig: Take 1 tablet (40 mg total)  by mouth daily.    Dispense:  90 tablet    Refill:  2   Patient Instructions   If A1c elevated, will need to look at changes in meds but keep up the good work with diet/exercise.   Type 2 Diabetes Mellitus, Self-Care, Adult Caring for yourself after you have been diagnosed with type 2 diabetes (type 2 diabetes mellitus) means keeping your blood sugar (glucose) under control with a balance of: Nutrition. Exercise. Lifestyle changes. Medicines or insulin, if needed. Support from your team of health care providers and others. The following information explains what you need to know to manage yourdiabetes at home. What are the risks? Having diabetes can put you at risk for other long-term (chronic) conditions, such as heart disease and kidney disease. Your health careprovider may prescribe medicines to help prevent complications from diabetes. How to monitor blood glucose  Check your blood glucose every day, as often as told by your health care provider. Have your A1C (hemoglobin A1C) level checked two or more times a year, or as often as told by your health care provider. Your health care provider will set personalized treatment goals for you. Generally, the goal of treatment is to maintain the following blood glucose levels: Before meals: 80-130 mg/dL (4.4-7.2 mmol/L). After meals: below 180 mg/dL (10 mmol/L). A1C level: less than 7%. How to manage hyperglycemia and hypoglycemia Hyperglycemia symptoms Hyperglycemia, also called high blood glucose, occurs when blood glucose is too high. Make sure you know the early signs of hyperglycemia, such as: Increased thirst. Hunger. Feeling very tired. Needing to urinate more often than usual. Blurry vision. Hypoglycemia symptoms Hypoglycemia, also called low blood glucose, occurs with a blood glucose level at or below 70 mg/dL (3.9 mmol/L). Diabetes medicines lower your blood glucose and can cause hypoglycemia. The risk for hypoglycemia  increases during or after exercise, during sleep, during illness, and when skipping meals or not eating for a long time (fasting). It is important to know the symptoms of hypoglycemia and treat it right away. Always have a 15-gram rapid-acting carbohydrate snack with you to treat low blood glucose. Family members and close friends should also know the symptoms and understand how to treat hypoglycemia, in case you are not able to treat yourself. Symptoms may include: Hunger. Anxiety. Sweating and feeling clammy. Dizziness or feeling light-headed. Sleepiness. Increased heart rate. Irritability. Tingling or numbness around the mouth, lips, or tongue. Restless sleep. Severe hypoglycemia is when your blood glucose level is at or below 54 mg/dL (3 mmol/L). Severe hypoglycemia is an emergency. Do not wait to see if the symptoms will go away. Get medical help right away. Call your local emergency services (911 in the [place] Do not drive yourself to the hospital. If you have severe hypoglycemia and you cannot eat or drink, you may need glucagon. A family member or close friend should learn how to check your blood glucose and how to give you glucagon. Ask your health care provider if you needto have an emergency glucagon kit available. Follow these instructions at home: Medicines Take diabetes medicines as told by your health care provider. If your health care provider prescribed insulin or diabetes medicines, take them every day.  Do not run out of insulin or other diabetes medicines. Plan ahead so you always have these available. If you use insulin, adjust your dosage based on your physical activity and what foods you eat. Your health care provider will tell you how to adjust your dosage. Take over-the-counter and prescription medicines only as told by your health care provider. Eating and drinking  What you eat and drink affects your blood glucose and your insulin dosage. Making good choices helps to  control your diabetes and prevent other health problems. A healthy meal plan includes eating lean proteins, complex carbohydrates, fresh fruits and vegetables, low-fat dairy products, and healthyfats. Make an appointment to see a registered dietitian to help you create an eating plan that is right for you. Make sure that you: Follow instructions from your health care provider about eating or drinking restrictions. Drink enough fluid to keep your urine pale yellow. Keep a record of the carbohydrates that you eat. Do this by reading food labels and learning the standard serving sizes of foods. Follow your sick-day plan whenever you cannot eat or drink as usual. Make this plan in advance with your health care provider.  Activity Stay active. Exercise regularly, as told by your health care provider. This may include: Stretching and doing strength exercises, such as yoga or weight lifting, 2 or more times a week. Doing 150 minutes or more of moderate-intensity or vigorous-intensity exercise each week. This could be brisk walking, biking, or water aerobics. Spread out your activity over 3 or more days of the week. Do not go more than 2 days in a row without doing some kind of physical activity. When you start a new exercise or activity, work with your health care provider to adjust your insulin, medicines, or food intake as needed. Lifestyle Do not use any products that contain nicotine or tobacco, such as cigarettes, e-cigarettes, and chewing tobacco. If you need help quitting, ask your health care provider. If your health care provider says that alcohol is safe for you, limit how much you use to no more than 1 drink a day for women who are not pregnant and 2 drinks a day for men. In the [place] one drink equals one 12 oz bottle of beer (355 mL), one 5 oz glass of wine (148 mL), or one 1 oz glass of hard liquor (44 mL). Learn to manage stress. If you need help with this, ask your health care  provider. Take care of your body  Keep your immunizations up to date. In addition to getting vaccinations as told by your health care provider, it is recommended that you get vaccinated against the following illnesses: The flu (influenza). Get a flu shot every year. Pneumonia. Hepatitis B. Schedule an eye exam soon after your diagnosis, and then one time every year after that. Check your skin and feet every day for cuts, bruises, redness, blisters, or sores. Schedule a foot exam with your health care provider once every year. Brush your teeth and gums two times a day, and floss one or more times a day. Visit your dentist one or more times every 6 months. Maintain a healthy weight.  General instructions Share your diabetes management plan with people in your workplace, school, and household. Carry a medical alert card or wear medical alert jewelry. Keep all follow-up visits as told by your health care provider. This is important. Questions to ask your health care provider Should I meet with a certified diabetes care and education specialist? Where  can I find a support group for people with diabetes? Where to find more information American Diabetes Association (ADA): www.diabetes.org American Association of Diabetes Care and Education Specialists (ADCES): www.diabeteseducator.org International Diabetes Federation (IDF): MemberVerification.ca Summary Caring for yourself after you have been diagnosed with type 2 diabetes (type 2 diabetes mellitus) means keeping your blood sugar (glucose) under control with a balance of nutrition, exercise, lifestyle changes, and medicine. Check your blood glucose every day, as often as told by your health care provider. Having diabetes can put you at risk for other long-term (chronic) conditions, such as heart disease and kidney disease. Your health care provider may prescribe medicines to help prevent complications from diabetes. Share your diabetes management plan  with people in your workplace, school, and household. Keep all follow-up visits as told by your health care provider. This is important. This information is not intended to replace advice given to you by your health care provider. Make sure you discuss any questions you have with your healthcare provider. Document Revised: 04/13/2019 Document Reviewed: 04/14/2019 Elsevier Patient Education  2021 [place],   Merri Ray, MD [place] 11/09/20 8:39 AM

## 2020-11-09 NOTE — Patient Instructions (Addendum)
If A1c elevated, will need to look at changes in meds but keep up the good work with diet/exercise.   Type 2 Diabetes Mellitus, Self-Care, Adult Caring for yourself after you have been diagnosed with type 2 diabetes (type 2 diabetes mellitus) means keeping your blood sugar (glucose) under control with a balance of: Nutrition. Exercise. Lifestyle changes. Medicines or insulin, if needed. Support from your team of health care providers and others. The following information explains what you need to know to manage yourdiabetes at home. What are the risks? Having diabetes can put you at risk for other long-term (chronic) conditions, such as heart disease and kidney disease. Your health careprovider may prescribe medicines to help prevent complications from diabetes. How to monitor blood glucose  Check your blood glucose every day, as often as told by your health care provider. Have your A1C (hemoglobin A1C) level checked two or more times a year, or as often as told by your health care provider. Your health care provider will set personalized treatment goals for you. Generally, the goal of treatment is to maintain the following blood glucose levels: Before meals: 80-130 mg/dL (4.4-7.2 mmol/L). After meals: below 180 mg/dL (10 mmol/L). A1C level: less than 7%. How to manage hyperglycemia and hypoglycemia Hyperglycemia symptoms Hyperglycemia, also called high blood glucose, occurs when blood glucose is too high. Make sure you know the early signs of hyperglycemia, such as: Increased thirst. Hunger. Feeling very tired. Needing to urinate more often than usual. Blurry vision. Hypoglycemia symptoms Hypoglycemia, also called low blood glucose, occurs with a blood glucose level at or below 70 mg/dL (3.9 mmol/L). Diabetes medicines lower your blood glucose and can cause hypoglycemia. The risk for hypoglycemia increases during or after exercise, during sleep, during illness, and when skipping meals  or not eating for a long time (fasting). It is important to know the symptoms of hypoglycemia and treat it right away. Always have a 15-gram rapid-acting carbohydrate snack with you to treat low blood glucose. Family members and close friends should also know the symptoms and understand how to treat hypoglycemia, in case you are not able to treat yourself. Symptoms may include: Hunger. Anxiety. Sweating and feeling clammy. Dizziness or feeling light-headed. Sleepiness. Increased heart rate. Irritability. Tingling or numbness around the mouth, lips, or tongue. Restless sleep. Severe hypoglycemia is when your blood glucose level is at or below 54 mg/dL (3 mmol/L). Severe hypoglycemia is an emergency. Do not wait to see if the symptoms will go away. Get medical help right away. Call your local emergency services (911 in the [place] Do not drive yourself to the hospital. If you have severe hypoglycemia and you cannot eat or drink, you may need glucagon. A family member or close friend should learn how to check your blood glucose and how to give you glucagon. Ask your health care provider if you needto have an emergency glucagon kit available. Follow these instructions at home: Medicines Take diabetes medicines as told by your health care provider. If your health care provider prescribed insulin or diabetes medicines, take them every day. Do not run out of insulin or other diabetes medicines. Plan ahead so you always have these available. If you use insulin, adjust your dosage based on your physical activity and what foods you eat. Your health care provider will tell you how to adjust your dosage. Take over-the-counter and prescription medicines only as told by your health care provider. Eating and drinking  What you eat and drink affects your blood glucose  and your insulin dosage. Making good choices helps to control your diabetes and prevent other health problems. A healthy meal plan includes eating  lean proteins, complex carbohydrates, fresh fruits and vegetables, low-fat dairy products, and healthyfats. Make an appointment to see a registered dietitian to help you create an eating plan that is right for you. Make sure that you: Follow instructions from your health care provider about eating or drinking restrictions. Drink enough fluid to keep your urine pale yellow. Keep a record of the carbohydrates that you eat. Do this by reading food labels and learning the standard serving sizes of foods. Follow your sick-day plan whenever you cannot eat or drink as usual. Make this plan in advance with your health care provider.  Activity Stay active. Exercise regularly, as told by your health care provider. This may include: Stretching and doing strength exercises, such as yoga or weight lifting, 2 or more times a week. Doing 150 minutes or more of moderate-intensity or vigorous-intensity exercise each week. This could be brisk walking, biking, or water aerobics. Spread out your activity over 3 or more days of the week. Do not go more than 2 days in a row without doing some kind of physical activity. When you start a new exercise or activity, work with your health care provider to adjust your insulin, medicines, or food intake as needed. Lifestyle Do not use any products that contain nicotine or tobacco, such as cigarettes, e-cigarettes, and chewing tobacco. If you need help quitting, ask your health care provider. If your health care provider says that alcohol is safe for you, limit how much you use to no more than 1 drink a day for women who are not pregnant and 2 drinks a day for men. In the [place] one drink equals one 12 oz bottle of beer (355 mL), one 5 oz glass of wine (148 mL), or one 1 oz glass of hard liquor (44 mL). Learn to manage stress. If you need help with this, ask your health care provider. Take care of your body  Keep your immunizations up to date. In addition to getting  vaccinations as told by your health care provider, it is recommended that you get vaccinated against the following illnesses: The flu (influenza). Get a flu shot every year. Pneumonia. Hepatitis B. Schedule an eye exam soon after your diagnosis, and then one time every year after that. Check your skin and feet every day for cuts, bruises, redness, blisters, or sores. Schedule a foot exam with your health care provider once every year. Brush your teeth and gums two times a day, and floss one or more times a day. Visit your dentist one or more times every 6 months. Maintain a healthy weight.  General instructions Share your diabetes management plan with people in your workplace, school, and household. Carry a medical alert card or wear medical alert jewelry. Keep all follow-up visits as told by your health care provider. This is important. Questions to ask your health care provider Should I meet with a certified diabetes care and education specialist? Where can I find a support group for people with diabetes? Where to find more information American Diabetes Association (ADA): www.diabetes.org American Association of Diabetes Care and Education Specialists (ADCES): www.diabeteseducator.org International Diabetes Federation (IDF): MemberVerification.ca Summary Caring for yourself after you have been diagnosed with type 2 diabetes (type 2 diabetes mellitus) means keeping your blood sugar (glucose) under control with a balance of nutrition, exercise, lifestyle changes, and medicine. Check your blood  glucose every day, as often as told by your health care provider. Having diabetes can put you at risk for other long-term (chronic) conditions, such as heart disease and kidney disease. Your health care provider may prescribe medicines to help prevent complications from diabetes. Share your diabetes management plan with people in your workplace, school, and household. Keep all follow-up visits as told by your  health care provider. This is important. This information is not intended to replace advice given to you by your health care provider. Make sure you discuss any questions you have with your healthcare provider. Document Revised: 04/13/2019 Document Reviewed: 04/14/2019 Elsevier Patient Education  2021 [employer].

## 2020-11-14 ENCOUNTER — Encounter: Payer: Self-pay | Admitting: Family Medicine

## 2020-11-14 DIAGNOSIS — E1165 Type 2 diabetes mellitus with hyperglycemia: Secondary | ICD-10-CM

## 2020-11-15 ENCOUNTER — Encounter: Payer: Self-pay | Admitting: Family Medicine

## 2020-11-15 ENCOUNTER — Other Ambulatory Visit: Payer: Self-pay

## 2020-11-15 ENCOUNTER — Other Ambulatory Visit: Payer: BC Managed Care – PPO | Admitting: *Deleted

## 2020-11-15 DIAGNOSIS — I480 Paroxysmal atrial fibrillation: Secondary | ICD-10-CM

## 2020-11-15 DIAGNOSIS — I10 Essential (primary) hypertension: Secondary | ICD-10-CM | POA: Diagnosis not present

## 2020-11-15 DIAGNOSIS — I2510 Atherosclerotic heart disease of native coronary artery without angina pectoris: Secondary | ICD-10-CM

## 2020-11-15 DIAGNOSIS — D6869 Other thrombophilia: Secondary | ICD-10-CM

## 2020-11-15 DIAGNOSIS — E7800 Pure hypercholesterolemia, unspecified: Secondary | ICD-10-CM | POA: Diagnosis not present

## 2020-11-15 DIAGNOSIS — E1165 Type 2 diabetes mellitus with hyperglycemia: Secondary | ICD-10-CM

## 2020-11-15 LAB — CBC WITH DIFFERENTIAL/PLATELET
Basophils Absolute: 0.1 10*3/uL (ref 0.0–0.2)
Basos: 1 %
EOS (ABSOLUTE): 0.1 10*3/uL (ref 0.0–0.4)
Eos: 2 %
Hematocrit: 47.9 % (ref 37.5–51.0)
Hemoglobin: 16.1 g/dL (ref 13.0–17.7)
Immature Grans (Abs): 0 10*3/uL (ref 0.0–0.1)
Immature Granulocytes: 1 %
Lymphocytes Absolute: 2.2 10*3/uL (ref 0.7–3.1)
Lymphs: 31 %
MCH: 29.4 pg (ref 26.6–33.0)
MCHC: 33.6 g/dL (ref 31.5–35.7)
MCV: 88 fL (ref 79–97)
Monocytes Absolute: 0.7 10*3/uL (ref 0.1–0.9)
Monocytes: 10 %
Neutrophils Absolute: 4 10*3/uL (ref 1.4–7.0)
Neutrophils: 55 %
Platelets: 190 10*3/uL (ref 150–450)
RBC: 5.47 x10E6/uL (ref 4.14–5.80)
RDW: 13.1 % (ref 11.6–15.4)
WBC: 7.1 10*3/uL (ref 3.4–10.8)

## 2020-11-15 LAB — BASIC METABOLIC PANEL
BUN/Creatinine Ratio: 16 (ref 10–24)
BUN: 14 mg/dL (ref 8–27)
CO2: 22 mmol/L (ref 20–29)
Calcium: 9.7 mg/dL (ref 8.6–10.2)
Chloride: 100 mmol/L (ref 96–106)
Creatinine, Ser: 0.9 mg/dL (ref 0.76–1.27)
Glucose: 147 mg/dL — ABNORMAL HIGH (ref 65–99)
Potassium: 4.6 mmol/L (ref 3.5–5.2)
Sodium: 137 mmol/L (ref 134–144)
eGFR: 98 mL/min/{1.73_m2} (ref >59)

## 2020-11-15 MED ORDER — GLUCOSE BLOOD VI STRP: ORAL_STRIP | 3 refills | Status: DC

## 2020-11-15 NOTE — Telephone Encounter (Signed)
Answered with lab result note.

## 2020-11-16 MED ORDER — METFORMIN HCL 1000 MG PO TABS: ORAL_TABLET | ORAL | 1 refills | Status: DC

## 2020-11-18 ENCOUNTER — Telehealth (HOSPITAL_COMMUNITY): Payer: Self-pay | Admitting: Emergency Medicine

## 2020-11-18 NOTE — Telephone Encounter (Signed)
Attempted to call patient regarding upcoming cardiac CT appointment. °Left message on voicemail with name and callback number °Leray Garverick RN Navigator Cardiac Imaging °[facility] °[phone] Office °[phone] Cell ° °

## 2020-11-18 NOTE — Telephone Encounter (Signed)
Reaching out to patient to offer assistance regarding upcoming cardiac imaging study; pt verbalizes understanding of appt date/time, parking situation and where to check in, pre-test NPO status and medications ordered, and verified current allergies; name and call back number provided for further questions should they arise Scott Bond RN [facility] [phone] office [phone] cell  Denies claustro Difficult IV

## 2020-11-22 ENCOUNTER — Other Ambulatory Visit: Payer: Self-pay

## 2020-11-22 ENCOUNTER — Ambulatory Visit (HOSPITAL_COMMUNITY): Payer: BC Managed Care – PPO

## 2020-11-22 ENCOUNTER — Ambulatory Visit (HOSPITAL_COMMUNITY)
Admission: RE | Admit: 2020-11-22 | Discharge: 2020-11-22 | Disposition: A | Discharge status: Home / Self Care | Payer: BC Managed Care – PPO | Source: Ambulatory Visit | Attending: Internal Medicine | Admitting: Internal Medicine

## 2020-11-22 DIAGNOSIS — I480 Paroxysmal atrial fibrillation: Secondary | ICD-10-CM | POA: Diagnosis not present

## 2020-11-22 MED ORDER — IOHEXOL 350 MG/ML SOLN
95.0000 mL | Freq: Once | INTRAVENOUS | Status: AC | PRN
  Administered 2020-11-22: 95 mL via INTRAVENOUS

## 2020-11-29 ENCOUNTER — Ambulatory Visit (HOSPITAL_COMMUNITY): Payer: BC Managed Care – PPO | Admitting: Certified Registered Nurse Anesthetist

## 2020-11-29 ENCOUNTER — Other Ambulatory Visit: Payer: Self-pay

## 2020-11-29 ENCOUNTER — Encounter (HOSPITAL_COMMUNITY): Payer: Self-pay | Admitting: Internal Medicine

## 2020-11-29 ENCOUNTER — Ambulatory Visit (HOSPITAL_COMMUNITY)
Admission: RE | Admit: 2020-11-29 | Discharge: 2020-11-29 | Disposition: A | Discharge status: Home / Self Care | Payer: BC Managed Care – PPO | Attending: Internal Medicine | Admitting: Internal Medicine

## 2020-11-29 ENCOUNTER — Encounter (HOSPITAL_COMMUNITY)
Admission: RE | Disposition: A | Discharge status: Home / Self Care | Payer: Self-pay | Source: Home / Self Care | Attending: Internal Medicine

## 2020-11-29 DIAGNOSIS — I2510 Atherosclerotic heart disease of native coronary artery without angina pectoris: Secondary | ICD-10-CM | POA: Diagnosis not present

## 2020-11-29 DIAGNOSIS — I4891 Unspecified atrial fibrillation: Secondary | ICD-10-CM | POA: Diagnosis not present

## 2020-11-29 DIAGNOSIS — I110 Hypertensive heart disease with heart failure: Secondary | ICD-10-CM | POA: Diagnosis not present

## 2020-11-29 DIAGNOSIS — I5032 Chronic diastolic (congestive) heart failure: Secondary | ICD-10-CM | POA: Diagnosis not present

## 2020-11-29 DIAGNOSIS — Z7901 Long term (current) use of anticoagulants: Secondary | ICD-10-CM | POA: Diagnosis not present

## 2020-11-29 DIAGNOSIS — I480 Paroxysmal atrial fibrillation: Secondary | ICD-10-CM | POA: Diagnosis not present

## 2020-11-29 HISTORY — PX: ATRIAL FIBRILLATION ABLATION: EP1191

## 2020-11-29 LAB — POCT ACTIVATED CLOTTING TIME
Activated Clotting Time: 295 seconds
Activated Clotting Time: 335 seconds

## 2020-11-29 LAB — GLUCOSE, CAPILLARY
Glucose-Capillary: 147 mg/dL — ABNORMAL HIGH (ref 70–99)
Glucose-Capillary: 173 mg/dL — ABNORMAL HIGH (ref 70–99)

## 2020-11-29 SURGERY — ATRIAL FIBRILLATION ABLATION
Anesthesia: General

## 2020-11-29 MED ORDER — HYDROCODONE-ACETAMINOPHEN 5-325 MG PO TABS
1.0000 | ORAL_TABLET | ORAL | Status: DC | PRN
  Filled 2020-11-29: qty 2

## 2020-11-29 MED ORDER — PANTOPRAZOLE SODIUM 40 MG PO TBEC: 40.0000 mg | DELAYED_RELEASE_TABLET | Freq: Every day | ORAL | 0 refills | Status: DC

## 2020-11-29 MED ORDER — DEXAMETHASONE SODIUM PHOSPHATE 10 MG/ML IJ SOLN
INTRAMUSCULAR | Status: DC | PRN
  Administered 2020-11-29: 5 mg via INTRAVENOUS

## 2020-11-29 MED ORDER — FENTANYL CITRATE (PF) 100 MCG/2ML IJ SOLN
INTRAMUSCULAR | Status: DC | PRN
  Administered 2020-11-29: 100 ug via INTRAVENOUS

## 2020-11-29 MED ORDER — PHENYLEPHRINE HCL-NACL 20-0.9 MG/250ML-% IV SOLN
INTRAVENOUS | Status: DC | PRN
  Administered 2020-11-29: 20 ug/min via INTRAVENOUS

## 2020-11-29 MED ORDER — PROTAMINE SULFATE 10 MG/ML IV SOLN
INTRAVENOUS | Status: DC | PRN
  Administered 2020-11-29: 40 mg via INTRAVENOUS

## 2020-11-29 MED ORDER — ONDANSETRON HCL 4 MG/2ML IJ SOLN
INTRAMUSCULAR | Status: DC | PRN
  Administered 2020-11-29: 4 mg via INTRAVENOUS

## 2020-11-29 MED ORDER — ACETAMINOPHEN 325 MG PO TABS
650.0000 mg | ORAL_TABLET | ORAL | Status: DC | PRN
  Filled 2020-11-29: qty 2

## 2020-11-29 MED ORDER — ISOPROTERENOL HCL 0.2 MG/ML IJ SOLN
INTRAMUSCULAR | Status: AC
  Filled 2020-11-29: qty 5

## 2020-11-29 MED ORDER — SODIUM CHLORIDE 0.9% FLUSH: 3.0000 mL | Freq: Two times a day (BID) | INTRAVENOUS | Status: DC

## 2020-11-29 MED ORDER — HEPARIN (PORCINE) IN NACL 1000-0.9 UT/500ML-% IV SOLN
INTRAVENOUS | Status: DC | PRN
  Administered 2020-11-29 (×3): 500 mL

## 2020-11-29 MED ORDER — SODIUM CHLORIDE 0.9 % IV SOLN: 250.0000 mL | INTRAVENOUS | Status: DC | PRN

## 2020-11-29 MED ORDER — PROPOFOL 10 MG/ML IV BOLUS
INTRAVENOUS | Status: DC | PRN
  Administered 2020-11-29: 140 mg via INTRAVENOUS
  Administered 2020-11-29: 30 mg via INTRAVENOUS

## 2020-11-29 MED ORDER — ROCURONIUM BROMIDE 10 MG/ML (PF) SYRINGE
PREFILLED_SYRINGE | INTRAVENOUS | Status: DC | PRN
  Administered 2020-11-29: 50 mg via INTRAVENOUS
  Administered 2020-11-29: 20 mg via INTRAVENOUS

## 2020-11-29 MED ORDER — HEPARIN SODIUM (PORCINE) 1000 UNIT/ML IJ SOLN
INTRAMUSCULAR | Status: DC | PRN
  Administered 2020-11-29: 15000 [IU] via INTRAVENOUS
  Administered 2020-11-29: 1000 [IU] via INTRAVENOUS

## 2020-11-29 MED ORDER — HEPARIN SODIUM (PORCINE) 1000 UNIT/ML IJ SOLN
INTRAMUSCULAR | Status: AC
  Filled 2020-11-29: qty 1

## 2020-11-29 MED ORDER — HEPARIN SODIUM (PORCINE) 1000 UNIT/ML IJ SOLN
INTRAMUSCULAR | Status: DC | PRN
  Administered 2020-11-29: 3000 [IU] via INTRAVENOUS

## 2020-11-29 MED ORDER — SODIUM CHLORIDE 0.9 % IV SOLN: INTRAVENOUS | Status: DC

## 2020-11-29 MED ORDER — SUGAMMADEX SODIUM 200 MG/2ML IV SOLN
INTRAVENOUS | Status: DC | PRN
  Administered 2020-11-29: 200 mg via INTRAVENOUS

## 2020-11-29 MED ORDER — ONDANSETRON HCL 4 MG/2ML IJ SOLN: 4.0000 mg | Freq: Four times a day (QID) | INTRAMUSCULAR | Status: DC | PRN

## 2020-11-29 MED ORDER — SODIUM CHLORIDE 0.9% FLUSH: 3.0000 mL | INTRAVENOUS | Status: DC | PRN

## 2020-11-29 MED ORDER — MIDAZOLAM HCL 2 MG/2ML IJ SOLN
INTRAMUSCULAR | Status: DC | PRN
  Administered 2020-11-29: 2 mg via INTRAVENOUS

## 2020-11-29 MED ORDER — LIDOCAINE 2% (20 MG/ML) 5 ML SYRINGE
INTRAMUSCULAR | Status: DC | PRN
  Administered 2020-11-29: 50 mg via INTRAVENOUS

## 2020-11-29 SURGICAL SUPPLY — 19 items
BLANKET WARM UNDERBOD FULL ACC (MISCELLANEOUS) ×2 IMPLANT
CATH 8FR REPROCESSED SOUNDSTAR (CATHETERS) ×2 IMPLANT
CATH 8FR SOUNDSTAR REPROCESSED (CATHETERS) IMPLANT
CATH OCTARAY 2.0 F 3-3-3-3-3 (CATHETERS) ×1 IMPLANT
CATH SMTCH THERMOCOOL SF DF (CATHETERS) ×1 IMPLANT
CATH WEBSTER BI DIR CS D-F CRV (CATHETERS) ×1 IMPLANT
CLOSURE PERCLOSE PROSTYLE (VASCULAR PRODUCTS) ×3 IMPLANT
COVER SWIFTLINK CONNECTOR (BAG) ×2 IMPLANT
MAT PREVALON FULL STRYKER (MISCELLANEOUS) ×1 IMPLANT
NDL BAYLIS TRANSSEPTAL 71CM (NEEDLE) IMPLANT
NEEDLE BAYLIS TRANSSEPTAL 71CM (NEEDLE) ×2 IMPLANT
PACK EP LATEX FREE (CUSTOM PROCEDURE TRAY) ×2
PACK EP LF (CUSTOM PROCEDURE TRAY) ×1 IMPLANT
PAD PRO RADIOLUCENT 2001M-C (PAD) ×2 IMPLANT
PATCH CARTO3 (PAD) ×1 IMPLANT
SHEATH BAYLIS TORFLEX (SHEATH) ×1 IMPLANT
SHEATH PINNACLE 7F 10CM (SHEATH) ×2 IMPLANT
SHEATH PINNACLE 9F 10CM (SHEATH) ×1 IMPLANT
SHEATH PROBE COVER 6X72 (BAG) ×1 IMPLANT

## 2020-11-29 NOTE — Progress Notes (Signed)
Discharge instructions reviewed with pt and his niece (via telephone) both voice understanding.

## 2020-11-29 NOTE — Progress Notes (Signed)
Ambulated in hallway and to the bathroom to void tol well. No bleeding noted before or after ambulation.  

## 2020-11-29 NOTE — H&P (Signed)
CC: afib  Scott White is a 60 y.o. male who presents today for electrophysiology study and ablation of afib.  Since last being seen in our clinic, the patient reports doing very well.  Today, he denies symptoms of palpitations, chest pain, shortness of breath,  lower extremity edema, dizziness, presyncope, or syncope.  The patient is otherwise without complaint today.   Past Medical History:  Diagnosis Date   Atrial fibrillation ([place])    a. Persistent, s/p afib ablation by [facility] 08/16/10;  b. s/p failed DCCV 05/2011; c. Chronic Tikosyn & Pradaxa (CHA2DS2VASc = 3-4).   CAD (coronary artery disease)    a. 07/2002 Inf MI s/p BMS -->RCA, otw nonobs dzs;  b. 12/2011 Neg MV.   Cancer ([place])    Phreesia 05/09/2020   CHF (congestive heart failure) ([place])    Phreesia 05/09/2020   Chronic diastolic CHF (congestive heart failure) ([facility])    Diabetes ([place])    Diabetes mellitus without complication ([place])    Phreesia 05/09/2020   HTN (hypertension)    Hyperlipidemia    Hypertension    Phreesia 05/09/2020   Myocardial infarction ([place])    Phreesia 05/09/2020   PVC's (premature ventricular contractions)    Sleep apnea    followed by Dr Gwenette Greet   Past Surgical History:  Procedure Laterality Date   atrial ablatio  08/16/10   afib ablation by [place]  2004    Successful PTCA and stent placement in the mid right   coronary artery with an extra 99% narrowing to 0% with placement of a 3.5 x  20 mm Express 2 stent with improvement of TIMI grade 1 flow to TIMI grade 3.   CARDIOVERSION  06/15/2011   Procedure: CARDIOVERSION;  Surgeon: Fay Records, MD;  Location: [facility];  Service: Cardiovascular;  Laterality: N/A;   HERNIA REPAIR N/A    Phreesia 05/09/2020   tonsilectomy      ROS- all systems are reviewed and negatives except as per HPI above  Current Facility-Administered Medications  Medication Dose Route Frequency Provider Last Rate Last Admin   0.9 %  sodium chloride infusion    Intravenous Continuous Takela Varden, Jeneen Rinks, MD 50 mL/hr at 11/29/20 0549 New Bag at 11/29/20 0549    Physical Exam: Vitals:   11/29/20 0538 11/29/20 0542  BP: 129/70   Pulse: 75   Resp: 18   Temp:  98.3 F (36.8 C)  TempSrc:  Oral  SpO2: 98%   Weight: 91.2 kg   Height: 5' 11.5" (1.816 m)     GEN- The patient is well appearing, alert and oriented x 3 today.   Head- normocephalic, atraumatic Eyes-  Sclera clear, conjunctiva pink Ears- hearing intact Oropharynx- clear Lungs- Clear to ausculation bilaterally, normal work of breathing Heart- Regular rate and rhythm, no murmurs, rubs or gallops, PMI not laterally displaced GI- soft, NT, ND, + BS Extremities- no clubbing, cyanosis, or edema  Wt Readings from Last 3 Encounters:  11/29/20 91.2 kg  11/09/20 95.4 kg  10/12/20 94.8 kg       Other studies personally reviewed: Additional studies/ records that were reviewed today include: Dr Lonia Skinner notes, my prior notes, echo  Review of the above records today demonstrates: as above   Echo 04/02/19- EF 55%, normal atrial size     ASSESSMENT AND PLAN:   1.  Paroxysmal atrial fibrillation The patient has symptomatic, recurrent  atrial fibrillation. he has failed medical therapy with tikosyn. Chads2vasc score is 3.  he is anticoagulated with eliquis .  Risk, benefits, and alternatives to EP study and radiofrequency ablation for afib were again discussed in detail today. These risks include but are not limited to stroke, bleeding, vascular damage, tamponade, perforation, damage to the esophagus, lungs, and other structures, pulmonary vein stenosis, worsening renal function, and death. The patient understands these risk and wishes to proceed.    Cardiac CT reviewed at length with the patient today.  he reports compliance with [place] without interruption.  Thompson Grayer MD, [facility] 11/29/2020 7:12 AM

## 2020-11-29 NOTE — Anesthesia Postprocedure Evaluation (Signed)
Anesthesia Post Note  Patient: Scott White  Procedure(s) Performed: ATRIAL FIBRILLATION ABLATION     Patient location during evaluation: PACU Anesthesia Type: General Level of consciousness: awake and alert Pain management: pain level controlled Vital Signs Assessment: post-procedure vital signs reviewed and stable Respiratory status: spontaneous breathing, nonlabored ventilation, respiratory function stable and patient connected to nasal cannula oxygen Cardiovascular status: blood pressure returned to baseline and stable Postop Assessment: no apparent nausea or vomiting Anesthetic complications: no   No notable events documented.  Last Vitals:  Vitals:   11/29/20 1145 11/29/20 1200  BP:  120/69  Pulse: 68 71  Resp: 13 17  Temp:    SpO2: 95% 97%    Last Pain:  Vitals:   11/29/20 1017  TempSrc:   PainSc: 3                  Effie Berkshire

## 2020-11-29 NOTE — Discharge Instructions (Addendum)
Post procedure care instructions No driving for 4 days. No lifting over 5 lbs for 1 week. No vigorous or sexual activity for 1 week. You may return to work/your usual activities on 12/07/20. Keep procedure site clean & dry. If you notice increased pain, swelling, bleeding or pus, call/return!  You may shower after 24 hours, but no soaking in baths/hot tubs/pools for 1 week.     You have an appointment set up with the [facility].  Multiple studies have shown that being followed by a dedicated atrial fibrillation clinic in addition to the standard care you receive from your other physicians improves health. We believe that enrollment in the atrial fibrillation clinic will allow [place] to better care for you.   The phone number to the [facility] is [phone]. The clinic is staffed Monday through Friday from 8:30am to 5pm.  Parking Directions: The clinic is located in the Heart and Vascular Building connected to [facility]. 1)From [address] turn on to [address] and go to the 3rd entrance  (Heart and Vascular entrance) on the right. 2)Look to the right for Heart &Vascular Parking Garage. 3)A code for the entrance is required, for Oct. Is 3333.   4)Take the elevators to the 1st floor. Registration is in the room with the glass walls at the end of the hallway.  If you have any trouble parking or locating the clinic, please don't hesitate to call [phone]. Cardiac Ablation, Care After  This sheet gives you information about how to care for yourself after your procedure. Your health care provider may also give you more specific instructions. If you have problems or questions, contact your health care provider. What can I expect after the procedure? After the procedure, it is common to have: Bruising around your puncture site. Tenderness around your puncture site. Skipped heartbeats. Tiredness (fatigue).  Follow these instructions at  home: Puncture site care  Follow instructions from your health care provider about how to take care of your puncture site. Make sure you: If present, leave stitches (sutures), skin glue, or adhesive strips in place. These skin closures may need to stay in place for up to 2 weeks. If adhesive strip edges start to loosen and curl up, you may trim the loose edges. Do not remove adhesive strips completely unless your health care provider tells you to do that. If a large square bandage is present, this may be removed 24 hours after surgery.  Check your puncture site every day for signs of infection. Check for: Redness, swelling, or pain. Fluid or blood. If your puncture site starts to bleed, lie down on your back, apply firm pressure to the area, and contact your health care provider. Warmth. Pus or a bad smell. Driving Do not drive for at least 4 days after your procedure or however long your health care provider recommends. (Do not resume driving if you have previously been instructed not to drive for other health reasons.) Do not drive or use heavy machinery while taking prescription pain medicine. Activity Avoid activities that take a lot of effort for at least 7 days after your procedure. Do not lift anything that is heavier than 5 lb (4.5 kg) for one week.  No sexual activity for 1 week.  Return to your normal activities as told by your health care provider. Ask your health care provider what activities are safe for you. General instructions Take over-the-counter and prescription medicines only as told by your health care provider.  Do not use any products that contain nicotine or tobacco, such as cigarettes and e-cigarettes. If you need help quitting, ask your health care provider. You may shower after 24 hours, but Do not take baths, swim, or use a hot tub for 1 week.  Do not drink alcohol for 24 hours after your procedure. Keep all follow-up visits as told by your health care provider. This  is important. Contact a health care provider if: You have redness, mild swelling, or pain around your puncture site. You have fluid or blood coming from your puncture site that stops after applying firm pressure to the area. Your puncture site feels warm to the touch. You have pus or a bad smell coming from your puncture site. You have a fever. You have chest pain or discomfort that spreads to your neck, jaw, or arm. You are sweating a lot. You feel nauseous. You have a fast or irregular heartbeat. You have shortness of breath. You are dizzy or light-headed and feel the need to lie down. You have pain or numbness in the arm or leg closest to your puncture site. Get help right away if: Your puncture site suddenly swells. Your puncture site is bleeding and the bleeding does not stop after applying firm pressure to the area. These symptoms may represent a serious problem that is an emergency. Do not wait to see if the symptoms will go away. Get medical help right away. Call your local emergency services (911 in the [place] Do not drive yourself to the hospital. Summary After the procedure, it is normal to have bruising and tenderness at the puncture site in your groin, neck, or forearm. Check your puncture site every day for signs of infection. Get help right away if your puncture site is bleeding and the bleeding does not stop after applying firm pressure to the area. This is a medical emergency. This information is not intended to replace advice given to you by your health care provider. Make sure you discuss any questions you have with your health care provider.

## 2020-11-29 NOTE — Anesthesia Preprocedure Evaluation (Addendum)
Anesthesia Evaluation  Patient identified by MRN, date of birth, ID band Patient awake    Reviewed: Allergy & Precautions, NPO status , Patient's Chart, lab work & pertinent test results  Airway Mallampati: I  TM Distance: >3 FB Neck ROM: Full    Dental  (+) Teeth Intact, Dental Advisory Given   Pulmonary sleep apnea ,    Pulmonary exam normal        Cardiovascular hypertension, + CAD, + Past MI, + Cardiac Stents and +CHF  + dysrhythmias Atrial Fibrillation  Rhythm:Irregular Rate:Normal  Echo: 1. Left ventricular ejection fraction, by visual estimation, is 55 to  60%. The left ventricle has normal function. There is no left ventricular  hypertrophy.  2. Left ventricular diastolic function could not be evaluated.  3. The left ventricle has no regional wall motion abnormalities.  4. Global right ventricle has normal systolic function.The right  ventricular size is normal. No increase in right ventricular wall  thickness.  5. Left atrial size was normal.  6. Right atrial size was normal.  7. Mild mitral annular calcification.  8. The mitral valve is grossly normal. Trivial mitral valve  regurgitation.  9. The tricuspid valve is normal in structure.  10. The aortic valve is tricuspid. Aortic valve regurgitation is not  visualized. No evidence of aortic valve sclerosis or stenosis.  11. The pulmonic valve was grossly normal. Pulmonic valve regurgitation is  not visualized.  12. The inferior vena cava is normal in size with greater than 50%  respiratory variability, suggesting right atrial pressure of 3 mmHg.    Neuro/Psych negative neurological ROS  negative psych ROS   GI/Hepatic negative GI ROS, Neg liver ROS,   Endo/Other  diabetes  Renal/GU negative Renal ROS     Musculoskeletal negative musculoskeletal ROS (+)   Abdominal Normal abdominal exam  (+)   Peds  Hematology negative hematology ROS (+)    Anesthesia Other Findings   Reproductive/Obstetrics                           Anesthesia Physical Anesthesia Plan  ASA: 3  Anesthesia Plan: General   Post-op Pain Management:    Induction: Intravenous  PONV Risk Score and Plan: 3 and Ondansetron, Dexamethasone and Midazolam  Airway Management Planned: Oral ETT  Additional Equipment: None  Intra-op Plan:   Post-operative Plan: Extubation in OR  Informed Consent: I have reviewed the patients History and Physical, chart, labs and discussed the procedure including the risks, benefits and alternatives for the proposed anesthesia with the patient or authorized representative who has indicated his/her understanding and acceptance.     Dental advisory given  Plan Discussed with: CRNA  Anesthesia Plan Comments:        Anesthesia Quick Evaluation

## 2020-11-29 NOTE — Transfer of Care (Signed)
Immediate Anesthesia Transfer of Care Note  Patient: Scott White  Procedure(s) Performed: ATRIAL FIBRILLATION ABLATION  Patient Location: PACU  Anesthesia Type:General  Level of Consciousness: drowsy  Airway & Oxygen Therapy: Patient Spontanous Breathing and Patient connected to nasal cannula oxygen  Post-op Assessment: Report given to RN and Post -op Vital signs reviewed and stable  Post vital signs: Reviewed and stable  Last Vitals:  Vitals Value Taken Time  BP 122/71 11/29/20 0935  Temp 36.8 C 11/29/20 0933  Pulse 72 11/29/20 0936  Resp 13 11/29/20 0936  SpO2 95 % 11/29/20 0936  Vitals shown include unvalidated device data.  Last Pain:  Vitals:   11/29/20 0933  TempSrc: Temporal  PainSc: Asleep         Complications: No notable events documented.

## 2020-11-29 NOTE — Anesthesia Procedure Notes (Signed)
Procedure Name: Intubation Date/Time: 11/29/2020 7:48 AM Performed by: Reece Agar, CRNA Pre-anesthesia Checklist: Patient identified, Emergency Drugs available, Suction available and Patient being monitored Patient Re-evaluated:Patient Re-evaluated prior to induction Oxygen Delivery Method: Circle System Utilized Preoxygenation: Pre-oxygenation with 100% oxygen Induction Type: IV induction Ventilation: Mask ventilation without difficulty Laryngoscope Size: Mac and 4 Grade View: Grade I Tube type: Oral Tube size: 7.5 mm Number of attempts: 1 Airway Equipment and Method: Stylet Placement Confirmation: ETT inserted through vocal cords under direct vision, positive ETCO2 and breath sounds checked- equal and bilateral Secured at: 23 cm Tube secured with: Tape Dental Injury: Teeth and Oropharynx as per pre-operative assessment

## 2020-12-08 DIAGNOSIS — G4733 Obstructive sleep apnea (adult) (pediatric): Secondary | ICD-10-CM | POA: Diagnosis not present

## 2020-12-12 ENCOUNTER — Other Ambulatory Visit: Payer: Self-pay | Admitting: Cardiology

## 2020-12-12 NOTE — Telephone Encounter (Signed)
Prescription refill request for Eliquis received. Indication:atrial fib Last office visit:7/22 Scr:0.9 Age: 60 Weight:91.2 kg  Prescription refilled

## 2020-12-25 ENCOUNTER — Other Ambulatory Visit: Payer: Self-pay | Admitting: Internal Medicine

## 2020-12-26 NOTE — Telephone Encounter (Signed)
Pt's pharmacy is requesting a refill on pantoprazole. Would Dr. Allred like to refill this medication? Please address 

## 2020-12-27 ENCOUNTER — Other Ambulatory Visit: Payer: Self-pay

## 2020-12-27 ENCOUNTER — Other Ambulatory Visit: Payer: Self-pay | Admitting: *Deleted

## 2020-12-27 ENCOUNTER — Encounter (HOSPITAL_COMMUNITY): Payer: Self-pay | Admitting: Nurse Practitioner

## 2020-12-27 ENCOUNTER — Ambulatory Visit (HOSPITAL_COMMUNITY)
Admission: RE | Admit: 2020-12-27 | Discharge: 2020-12-27 | Disposition: A | Discharge status: Home / Self Care | Payer: BC Managed Care – PPO | Source: Ambulatory Visit | Attending: Nurse Practitioner | Admitting: Nurse Practitioner

## 2020-12-27 VITALS — BP 136/80 | HR 66 | Ht 71.5 in | Wt 209.0 lb

## 2020-12-27 DIAGNOSIS — I4819 Other persistent atrial fibrillation: Secondary | ICD-10-CM | POA: Diagnosis not present

## 2020-12-27 DIAGNOSIS — Z8249 Family history of ischemic heart disease and other diseases of the circulatory system: Secondary | ICD-10-CM | POA: Diagnosis not present

## 2020-12-27 DIAGNOSIS — D6869 Other thrombophilia: Secondary | ICD-10-CM | POA: Diagnosis not present

## 2020-12-27 DIAGNOSIS — I480 Paroxysmal atrial fibrillation: Secondary | ICD-10-CM

## 2020-12-27 DIAGNOSIS — Z7901 Long term (current) use of anticoagulants: Secondary | ICD-10-CM | POA: Insufficient documentation

## 2020-12-27 LAB — MAGNESIUM: Magnesium: 1.9 mg/dL (ref 1.7–2.4)

## 2020-12-27 NOTE — Progress Notes (Signed)
Primary Care Physician: [name], MD Referring Physician: Dr. Hart [place] Scott White is a 59 y.o. male with a h/o afib ablation x 2. His last one was one month ago. He is doing well staying in rhythm since the  procedure. He continues on dofetilide. No swallowing or groin issues since the procedure.    Today, he denies symptoms of palpitations, chest pain, shortness of breath, orthopnea, PND, lower extremity edema, dizziness, presyncope, syncope, or neurologic sequela. The patient is tolerating medications without difficulties and is otherwise without complaint today.   Past Medical History:  Diagnosis Date   Atrial fibrillation ([place])    a. Persistent, s/p afib ablation by JA 08/16/10;  b. s/p failed DCCV 05/2011; c. Chronic Tikosyn & Pradaxa (CHA2DS2VASc = 3-4).   CAD (coronary artery disease)    a. 07/2002 Inf MI s/p BMS -->RCA, otw nonobs dzs;  b. 12/2011 Neg MV.   Cancer ([place])    Phreesia 05/09/2020   CHF (congestive heart failure) ([place])    Phreesia 05/09/2020   Chronic diastolic CHF (congestive heart failure) (HCC)    Diabetes ([place])    Diabetes mellitus without complication ([place])    Phreesia 05/09/2020   HTN (hypertension)    Hyperlipidemia    Hypertension    Phreesia 05/09/2020   Myocardial infarction ([place])    Phreesia 05/09/2020   PVC's (premature ventricular contractions)    Sleep apnea    followed by Dr Gwenette Greet   Past Surgical History:  Procedure Laterality Date   atrial ablatio  08/16/10   afib ablation by [place] N/A 11/29/2020   Procedure: ATRIAL FIBRILLATION ABLATION;  Surgeon: Thompson Grayer, MD;  Location: [facility];  Service: Cardiovascular;  Laterality: N/A;   CARDIAC CATHETERIZATION  2004    Successful PTCA and stent placement in the mid right   coronary artery with an extra 99% narrowing to 0% with placement of a 3.5 x  20 mm Express 2 stent with improvement of TIMI grade 1 flow to TIMI grade 3.   CARDIOVERSION   06/15/2011   Procedure: CARDIOVERSION;  Surgeon: Fay Records, MD;  Location: [facility];  Service: Cardiovascular;  Laterality: N/A;   HERNIA REPAIR N/A    Phreesia 05/09/2020   tonsilectomy      Current Outpatient Medications  Medication Sig Dispense Refill   apixaban (ELIQUIS) 5 MG TABS tablet TAKE 1 TABLET TWICE A DAY 180 tablet 1   Boron 3 MG CAPS Take 3 mg by mouth daily.      dofetilide (TIKOSYN) 500 MCG capsule TAKE 1 CAPSULE TWICE A DAY (SCHEDULE APPOINTMENT FOR FUTURE REFILLS) 180 capsule 3   ezetimibe (ZETIA) 10 MG tablet TAKE 1 TABLET DAILY 90 tablet 3   Ginger, Zingiber officinalis, (GINGER PO) Take 550 mg by mouth 2 (two) times daily.     Ginkgo Biloba 120 MG TABS Take 120 mg by mouth 2 (two) times daily.     GINSENG PO Take 150 mg by mouth 2 (two) times daily.     glucose blood test strip Use as instructed - 1-2 per day. 70 each 3   HAWTHORN BERRY PO Take 1,500 mg by mouth 2 (two) times daily.     lisinopril (ZESTRIL) 20 MG tablet TAKE 1 TABLET BY MOUTH EVERY DAY IN THE EVENING 90 tablet 3   Magnesium 100 MG TABS Take 400 mg by mouth 2 (two) times daily.     metFORMIN (GLUCOPHAGE) 1000  MG tablet TAKE 1 TABLET (1000 MG TOTAL) BY MOUTH 2 (TWO) TIMES DAILY WITH A MEAL. 180 tablet 1   metoprolol succinate (TOPROL-XL) 25 MG 24 hr tablet TAKE 1 TABLET DAILY 90 tablet 1   Milk Thistle 300 MG CAPS Take 300 mg by mouth 2 (two) times daily.     Multiple Vitamins-Minerals (CENTRUM PO) Take 1 tablet by mouth daily.     nitroGLYCERIN (NITROSTAT) 0.4 MG SL tablet Place 1 tablet (0.4 mg total) under the tongue every 5 (five) minutes as needed. For chest pain at 3rd dose call 911 25 tablet 4   Omega-3 Fatty Acids (FISH OIL PO) Take 1,400 mg by mouth 2 (two) times daily.     RHODIOLA ROSEA PO Take 500 mg by mouth 2 (two) times daily.     rosuvastatin (CRESTOR) 40 MG tablet Take 1 tablet (40 mg total) by mouth daily. 90 tablet 2   tadalafil (CIALIS) 10 MG tablet TAKE 1 TABLET DAILY AS NEEDED FOR  ERECTILE DYSFUNCTION. DO NOT USE IF TAKING NITROGLYCERIN 24 tablet 4   No current facility-administered medications for this encounter.    No Known Allergies  Social History   Socioeconomic History   Marital status: Single    Spouse name: Not on file   Number of children: 0   Years of education: Not on file   Highest education level: Not on file  Occupational History   Occupation: Pharmacist, community: [employer]  Tobacco Use   Smoking status: Never   Smokeless tobacco: Never  Vaping Use   Vaping Use: Never used  Substance and Sexual Activity   Alcohol use: Yes    Alcohol/week: 1.0 standard drink    Types: 1 Cans of beer per week    Comment:  three times a week   Drug use: No   Sexual activity: Yes  Other Topics Concern   Not on file  Social History Narrative   Pt lives in [place] alone.  Single.   He is a Teacher, English as a foreign language for [employer]   Social Determinants of Health   Financial Resource Strain: Not on file  Food Insecurity: Not on file  Transportation Needs: Not on file  Physical Activity: Not on file  Stress: Not on file  Social Connections: Not on file  Intimate Partner Violence: Not on file    Family History  Problem Relation Age of Onset   Coronary artery disease Mother    Heart failure Mother    Heart disease Mother    Hyperlipidemia Mother    Hypertension Mother    Alzheimer's disease Father    Diabetes Father    Diabetes Brother    Heart disease Brother    Hyperlipidemia Brother    Hypertension Brother    Alzheimer's disease Brother    Hyperlipidemia Brother    Colon cancer Neg Hx    Pancreatic cancer Neg Hx    Rectal cancer Neg Hx    Stomach cancer Neg Hx     ROS- All systems are reviewed and negative except as per the HPI above  Physical Exam: Vitals:   12/27/20 1526  BP: 136/80  Pulse: 66  Weight: 94.8 kg  Height: 5' 11.5" (1.816 m)   Wt Readings from Last 3 Encounters:  12/27/20 94.8 kg   11/29/20 91.2 kg  11/09/20 95.4 kg    Labs: Lab Results  Component Value Date   NA 137 11/15/2020   K 4.6 11/15/2020  CL 100 11/15/2020   CO2 22 11/15/2020   GLUCOSE 147 (H) 11/15/2020   BUN 14 11/15/2020   CREATININE 0.90 11/15/2020   CALCIUM 9.7 11/15/2020   MG 2.0 08/09/2020   Lab Results  Component Value Date   INR 1.21 07/29/2011   Lab Results  Component Value Date   CHOL 119 11/09/2020   HDL 36.20 (L) 11/09/2020   LDLCALC 51 05/11/2020   TRIG 212.0 (H) 11/09/2020     GEN- The patient is well appearing, alert and oriented x 3 today.   Head- normocephalic, atraumatic Eyes-  Sclera clear, conjunctiva pink Ears- hearing intact Oropharynx- clear Neck- supple, no JVP Lymph- no cervical lymphadenopathy Lungs- Clear to ausculation bilaterally, normal work of breathing Heart- Regular rate and rhythm, no murmurs, rubs or gallops, PMI not laterally displaced GI- soft, NT, ND, + BS Extremities- no clubbing, cyanosis, or edema MS- no significant deformity or atrophy Skin- no rash or lesion Psych- euthymic mood, full affect Neuro- strength and sensation are intact  EKG-NSR at 66 bpm, pr int 186 ms, qrs int 90 ms, qtc 454 ms   Assessment and Plan:  1. Persistent afib  S/p 2nd ablation one  month ago and is staying in SR  Continue dofetilide 500 mcg bid  Continue metoprolol 25 mg  daily   2. CHA2DS2VASc  score of 3 Continue eliquis 5 mg bid   3. HTN Stable   F/u with Dr. Rayann Heman 12/19    Scott White. Scott White, [facility] [address] [place], [place] [address] [phone]

## 2021-01-09 DIAGNOSIS — G4733 Obstructive sleep apnea (adult) (pediatric): Secondary | ICD-10-CM | POA: Diagnosis not present

## 2021-01-22 ENCOUNTER — Encounter: Payer: Self-pay | Admitting: Family Medicine

## 2021-01-23 ENCOUNTER — Other Ambulatory Visit: Payer: Self-pay

## 2021-01-23 DIAGNOSIS — E1165 Type 2 diabetes mellitus with hyperglycemia: Secondary | ICD-10-CM

## 2021-01-23 MED ORDER — GLUCOSE BLOOD VI STRP
ORAL_STRIP | 3 refills | Status: DC
Start: 2021-01-23 — End: 2022-03-23

## 2021-01-28 ENCOUNTER — Encounter: Payer: Self-pay | Admitting: Family Medicine

## 2021-01-30 ENCOUNTER — Other Ambulatory Visit: Payer: Self-pay | Admitting: Family Medicine

## 2021-01-30 DIAGNOSIS — I10 Essential (primary) hypertension: Secondary | ICD-10-CM

## 2021-02-08 DIAGNOSIS — L821 Other seborrheic keratosis: Secondary | ICD-10-CM | POA: Diagnosis not present

## 2021-02-08 DIAGNOSIS — L814 Other melanin hyperpigmentation: Secondary | ICD-10-CM | POA: Diagnosis not present

## 2021-02-08 DIAGNOSIS — L820 Inflamed seborrheic keratosis: Secondary | ICD-10-CM | POA: Diagnosis not present

## 2021-02-08 DIAGNOSIS — L568 Other specified acute skin changes due to ultraviolet radiation: Secondary | ICD-10-CM | POA: Diagnosis not present

## 2021-02-08 DIAGNOSIS — R208 Other disturbances of skin sensation: Secondary | ICD-10-CM | POA: Diagnosis not present

## 2021-02-08 DIAGNOSIS — Z789 Other specified health status: Secondary | ICD-10-CM | POA: Diagnosis not present

## 2021-02-08 DIAGNOSIS — G4733 Obstructive sleep apnea (adult) (pediatric): Secondary | ICD-10-CM | POA: Diagnosis not present

## 2021-02-23 ENCOUNTER — Other Ambulatory Visit: Payer: Self-pay | Admitting: Family Medicine

## 2021-02-23 DIAGNOSIS — E119 Type 2 diabetes mellitus without complications: Secondary | ICD-10-CM

## 2021-02-24 ENCOUNTER — Ambulatory Visit (INDEPENDENT_AMBULATORY_CARE_PROVIDER_SITE_OTHER): Payer: BC Managed Care – PPO | Admitting: Registered Nurse

## 2021-02-24 ENCOUNTER — Other Ambulatory Visit: Payer: Self-pay | Admitting: Registered Nurse

## 2021-02-24 ENCOUNTER — Encounter: Payer: Self-pay | Admitting: Registered Nurse

## 2021-02-24 ENCOUNTER — Ambulatory Visit: Payer: BC Managed Care – PPO | Admitting: Family Medicine

## 2021-02-24 VITALS — BP 126/70 | HR 69 | Temp 98.0°F | Resp 16 | Ht 71.5 in | Wt 207.8 lb

## 2021-02-24 DIAGNOSIS — E1165 Type 2 diabetes mellitus with hyperglycemia: Secondary | ICD-10-CM

## 2021-02-24 LAB — HEMOGLOBIN A1C: Hgb A1c MFr Bld: 7.5 % — ABNORMAL HIGH (ref 4.6–6.5)

## 2021-02-24 MED ORDER — METFORMIN HCL 1000 MG PO TABS: ORAL_TABLET | ORAL | 1 refills | Status: DC

## 2021-02-24 MED ORDER — EMPAGLIFLOZIN 10 MG PO TABS: 10.0000 mg | ORAL_TABLET | Freq: Every day | ORAL | 0 refills | Status: DC

## 2021-02-24 NOTE — Progress Notes (Signed)
Established Patient Office Visit  Subjective:  Patient ID: Scott White, male    DOB: 20-Oct-1960  Age: 60 y.o. MRN: [id number]  CC:  Chief Complaint  Patient presents with   Diabetes    Pt reports checking his glucose,    Immunizations    Pt would like to continue pneumonia vaccine series if recommended     HPI Scott White presents for t2dm  Last A1c:  Lab Results  Component Value Date   HGBA1C 7.9 (H) 11/09/2020    Currently taking: metformin 1021m po bid ac. No new complications Checking home sugars regularly - helping him to identify foods that drive up his sugars. Reports good compliance with medications Diet has been improved since last visit Exercise habits have been stable.   Past Medical History:  Diagnosis Date   Atrial fibrillation ([place]    a. Persistent, s/p afib ablation by [facility] 08/16/10;  b. s/p failed DCCV 05/2011; c. Chronic Tikosyn & Pradaxa (CHA2DS2VASc = 3-4).   CAD (coronary artery disease)    a. 07/2002 Inf MI s/p BMS -->RCA, otw nonobs dzs;  b. 12/2011 Neg MV.   Cancer ([place]    Phreesia 05/09/2020   CHF (congestive heart failure) ([place]    Phreesia 05/09/2020   Chronic diastolic CHF (congestive heart failure) (HCC)    Diabetes ([place]    Diabetes mellitus without complication ([place]    Phreesia 05/09/2020   HTN (hypertension)    Hyperlipidemia    Hypertension    Phreesia 05/09/2020   Myocardial infarction ([place]    Phreesia 05/09/2020   PVC's (premature ventricular contractions)    Sleep apnea    followed by Dr CGwenette Greet   Past Surgical History:  Procedure Laterality Date   atrial ablatio  08/16/10   afib ablation by [facility]/A 11/29/2020   Procedure: ATRIAL FIBRILLATION ABLATION;  Surgeon: AThompson Grayer MD;  [facility];  Service: Cardiovascular;  Laterality: N/A;   CARDIAC CATHETERIZATION  2004    Successful PTCA and stent placement in the mid right   coronary artery with an extra 99%  narrowing to 0% with placement of a 3.5 x  20 mm Express 2 stent with improvement of TIMI grade 1 flow to TIMI grade 3.   CARDIOVERSION  06/15/2011   Procedure: CARDIOVERSION;  Surgeon: PFay Records MD;  [place] [facility]  Service: Cardiovascular;  Laterality: N/A;   HERNIA REPAIR N/A    Phreesia 05/09/2020   tonsilectomy      Family History  Problem Relation Age of Onset   Coronary artery disease Mother    Heart failure Mother    Heart disease Mother    Hyperlipidemia Mother    Hypertension Mother    Alzheimer's disease Father    Diabetes Father    Diabetes Brother    Heart disease Brother    Hyperlipidemia Brother    Hypertension Brother    Alzheimer's disease Brother    Hyperlipidemia Brother    Colon cancer Neg Hx    Pancreatic cancer Neg Hx    Rectal cancer Neg Hx    Stomach cancer Neg Hx     Social History   Socioeconomic History   Marital status: Single    Spouse name: Not on file   Number of children: 0   Years of education: Not on file   Highest education level: Not on file  Occupational History   Occupation: DTeacher, English as a foreign language  Employer: [employer]  Tobacco Use   Smoking status: Never   Smokeless tobacco: Never  Vaping Use   Vaping Use: Never used  Substance and Sexual Activity   Alcohol use: Yes    Alcohol/week: 1.0 standard drink    Types: 1 Cans of beer per week    Comment:  three times a week   Drug use: No   Sexual activity: Yes  Other Topics Concern   Not on file  Social History Narrative   Pt lives in [place] alone.  Single.   He is a Teacher, English as a foreign language for [employer]   Social Determinants of Health   Financial Resource Strain: Not on file  Food Insecurity: Not on file  Transportation Needs: Not on file  Physical Activity: Not on file  Stress: Not on file  Social Connections: Not on file  Intimate Partner Violence: Not on file    Outpatient Medications Prior to Visit  Medication Sig Dispense Refill   apixaban  (ELIQUIS) 5 MG TABS tablet TAKE 1 TABLET TWICE A DAY 180 tablet 1   Boron 3 MG CAPS Take 3 mg by mouth daily.      dofetilide (TIKOSYN) 500 MCG capsule TAKE 1 CAPSULE TWICE A DAY (SCHEDULE APPOINTMENT FOR FUTURE REFILLS) 180 capsule 3   ezetimibe (ZETIA) 10 MG tablet TAKE 1 TABLET DAILY 90 tablet 3   Ginger, Zingiber officinalis, (GINGER PO) Take 550 mg by mouth 2 (two) times daily.     Ginkgo Biloba 120 MG TABS Take 120 mg by mouth 2 (two) times daily.     GINSENG PO Take 150 mg by mouth 2 (two) times daily.     glucose blood test strip Use as instructed - 1-2 per day. 70 each 3   HAWTHORN BERRY PO Take 1,500 mg by mouth 2 (two) times daily.     lisinopril (ZESTRIL) 20 MG tablet TAKE 1 TABLET BY MOUTH EVERY DAY IN THE EVENING 90 tablet 3   Magnesium 100 MG TABS Take 400 mg by mouth 2 (two) times daily.     metoprolol succinate (TOPROL-XL) 25 MG 24 hr tablet TAKE 1 TABLET DAILY 90 tablet 3   Milk Thistle 300 MG CAPS Take 300 mg by mouth 2 (two) times daily.     Multiple Vitamins-Minerals (CENTRUM PO) Take 1 tablet by mouth daily.     nitroGLYCERIN (NITROSTAT) 0.4 MG SL tablet Place 1 tablet (0.4 mg total) under the tongue every 5 (five) minutes as needed. For chest pain at 3rd dose call 911 25 tablet 4   Omega-3 Fatty Acids (FISH OIL PO) Take 1,400 mg by mouth 2 (two) times daily.     RHODIOLA ROSEA PO Take 500 mg by mouth 2 (two) times daily.     rosuvastatin (CRESTOR) 40 MG tablet Take 1 tablet (40 mg total) by mouth daily. 90 tablet 2   tadalafil (CIALIS) 10 MG tablet TAKE 1 TABLET DAILY AS NEEDED FOR ERECTILE DYSFUNCTION. DO NOT USE IF TAKING NITROGLYCERIN 24 tablet 4   metFORMIN (GLUCOPHAGE) 1000 MG tablet TAKE 1 TABLET (1000 MG TOTAL) BY MOUTH 2 (TWO) TIMES DAILY WITH A MEAL. 180 tablet 1   metFORMIN (GLUCOPHAGE) 850 MG tablet TAKE 1 TABLET (850 MG TOTAL) BY MOUTH 2 (TWO) TIMES DAILY WITH A MEAL. 180 tablet 1   No facility-administered medications prior to visit.    No Known  Allergies  ROS Review of Systems  Constitutional: Negative.   HENT: Negative.    Eyes:  Negative.   Respiratory: Negative.    Cardiovascular: Negative.   Gastrointestinal: Negative.   Genitourinary: Negative.   Musculoskeletal: Negative.   Skin: Negative.   Neurological: Negative.   Psychiatric/Behavioral: Negative.    All other systems reviewed and are negative.    Objective:    Physical Exam Constitutional:      General: He is not in acute distress.    Appearance: Normal appearance. He is normal weight. He is not ill-appearing, toxic-appearing or diaphoretic.  Cardiovascular:     Rate and Rhythm: Normal rate and regular rhythm.     Heart sounds: Normal heart sounds. No murmur heard.   No friction rub. No gallop.  Pulmonary:     Effort: Pulmonary effort is normal. No respiratory distress.     Breath sounds: Normal breath sounds. No stridor. No wheezing, rhonchi or rales.  Chest:     Chest wall: No tenderness.  Neurological:     General: No focal deficit present.     Mental Status: He is alert and oriented to person, place, and time. Mental status is at baseline.  Psychiatric:        Mood and Affect: Mood normal.        Behavior: Behavior normal.        Thought Content: Thought content normal.        Judgment: Judgment normal.    BP 126/70   Pulse 69   Temp 98 F (36.7 C) (Temporal)   Resp 16   Ht 5' 11.5" (1.816 m)   Wt 207 lb 12.8 oz (94.3 kg)   SpO2 97%   BMI 28.58 kg/m  Wt Readings from Last 3 Encounters:  02/24/21 207 lb 12.8 oz (94.3 kg)  12/27/20 209 lb (94.8 kg)  11/29/20 201 lb (91.2 kg)     Health Maintenance Due  Topic Date Due   Pneumococcal Vaccine 80-65 Years old (2 - PCV) 03/25/2018    There are no preventive care reminders to display for this patient.  Lab Results  Component Value Date   TSH 2.260 04/02/2019   Lab Results  Component Value Date   WBC 7.1 11/15/2020   HGB 16.1 11/15/2020   HCT 47.9 11/15/2020   MCV 88 11/15/2020    PLT 190 11/15/2020   Lab Results  Component Value Date   NA 137 11/15/2020   K 4.6 11/15/2020   CO2 22 11/15/2020   GLUCOSE 147 (H) 11/15/2020   BUN 14 11/15/2020   CREATININE 0.90 11/15/2020   BILITOT 0.4 05/11/2020   ALKPHOS 49 05/11/2020   AST 30 05/11/2020   ALT 33 05/11/2020   PROT 6.8 05/11/2020   ALBUMIN 4.6 05/11/2020   CALCIUM 9.7 11/15/2020   EGFR 98 11/15/2020   GFR 87.59 01/14/2012   Lab Results  Component Value Date   CHOL 119 11/09/2020   Lab Results  Component Value Date   HDL 36.20 (L) 11/09/2020   Lab Results  Component Value Date   LDLCALC 51 05/11/2020   Lab Results  Component Value Date   TRIG 212.0 (H) 11/09/2020   Lab Results  Component Value Date   CHOLHDL 3 11/09/2020   Lab Results  Component Value Date   HGBA1C 7.9 (H) 11/09/2020      Assessment & Plan:   Problem List Items Addressed This Visit   None Visit Diagnoses     Type 2 diabetes mellitus with hyperglycemia, without long-term current use of insulin ([place])    -  Primary  Relevant Medications   metFORMIN (GLUCOPHAGE) 1000 MG tablet   Other Relevant Orders   Hemoglobin A1c       Meds ordered this encounter  Medications   metFORMIN (GLUCOPHAGE) 1000 MG tablet    Sig: TAKE 1 TABLET (1000 MG TOTAL) BY MOUTH 2 (TWO) TIMES DAILY WITH A MEAL.    Dispense:  180 tablet    Refill:  1    Order Specific Question:   Supervising Provider    Answer:   Carlota Raspberry, JEFFREY R [[id number]]    Follow-up: Return in about 3 months (around 05/25/2021) for t2dm.   PLAN Send out A1c. Promising to hear patient has made improvements to lifestyle and is more cognizant of diet choices. If no improvement or worsening of A1c can consider adding SGLT2 given his elevated cardiac risk. Return in 3 mo for recheck with pcp Refill metformin Patient encouraged to call clinic with any questions, comments, or concerns.   Maximiano Coss, NP

## 2021-02-24 NOTE — Patient Instructions (Signed)
Mr. Haug -   Doristine Devoid to meet you  A1c will be back this afternoon  If still high, can start jardiance 10mg  daily. Take in mornings with metformin.  Good work on checking sugars.   Follow up with Dr. Carlota Raspberry in 3 mo to recheck sugars   Call sooner if you need anything  Thanks  Rich

## 2021-02-27 ENCOUNTER — Other Ambulatory Visit: Payer: Self-pay | Admitting: Cardiology

## 2021-02-27 DIAGNOSIS — E7800 Pure hypercholesterolemia, unspecified: Secondary | ICD-10-CM

## 2021-03-06 ENCOUNTER — Ambulatory Visit (INDEPENDENT_AMBULATORY_CARE_PROVIDER_SITE_OTHER): Payer: BC Managed Care – PPO | Admitting: Internal Medicine

## 2021-03-06 ENCOUNTER — Other Ambulatory Visit: Payer: Self-pay

## 2021-03-06 ENCOUNTER — Encounter: Payer: Self-pay | Admitting: Internal Medicine

## 2021-03-06 VITALS — BP 138/70 | HR 69 | Ht 71.5 in | Wt 209.0 lb

## 2021-03-06 DIAGNOSIS — E7800 Pure hypercholesterolemia, unspecified: Secondary | ICD-10-CM

## 2021-03-06 DIAGNOSIS — I2510 Atherosclerotic heart disease of native coronary artery without angina pectoris: Secondary | ICD-10-CM

## 2021-03-06 DIAGNOSIS — I10 Essential (primary) hypertension: Secondary | ICD-10-CM | POA: Diagnosis not present

## 2021-03-06 DIAGNOSIS — I480 Paroxysmal atrial fibrillation: Secondary | ICD-10-CM | POA: Diagnosis not present

## 2021-03-06 MED ORDER — METOPROLOL SUCCINATE ER 25 MG PO TB24: 12.5000 mg | ORAL_TABLET | Freq: Every day | ORAL | 0 refills | Status: DC

## 2021-03-06 NOTE — Progress Notes (Signed)
PCP: [name], MD Primary Cardiologist: Dr Gevena Barre Cure is a 60 y.o. male who presents today for routine electrophysiology followup.  Since his recent afib ablation, the patient reports doing very well.  he denies procedure related complications and is pleased with the results of the procedure.  Today, he denies symptoms of palpitations, chest pain, shortness of breath,  lower extremity edema, dizziness, presyncope, or syncope.  The patient is otherwise without complaint today.   Past Medical History:  Diagnosis Date   Atrial fibrillation ([place])    a. Persistent, s/p afib ablation by [facility] 08/16/10;  b. s/p failed DCCV 05/2011; c. Chronic Tikosyn & Pradaxa (CHA2DS2VASc = 3-4).   CAD (coronary artery disease)    a. 07/2002 Inf MI s/p BMS -->RCA, otw nonobs dzs;  b. 12/2011 Neg MV.   Cancer ([place])    Phreesia 05/09/2020   CHF (congestive heart failure) ([place])    Phreesia 05/09/2020   Chronic diastolic CHF (congestive heart failure) (HCC)    Diabetes ([place])    Diabetes mellitus without complication ([place])    Phreesia 05/09/2020   HTN (hypertension)    Hyperlipidemia    Hypertension    Phreesia 05/09/2020   Myocardial infarction ([place])    Phreesia 05/09/2020   PVC's (premature ventricular contractions)    Sleep apnea    followed by Dr Gwenette Greet   Past Surgical History:  Procedure Laterality Date   atrial ablatio  08/16/10   afib ablation by [place] N/A 11/29/2020   Procedure: ATRIAL FIBRILLATION ABLATION;  Surgeon: Thompson Grayer, MD;  Location: [facility];  Service: Cardiovascular;  Laterality: N/A;   CARDIAC CATHETERIZATION  2004    Successful PTCA and stent placement in the mid right   coronary artery with an extra 99% narrowing to 0% with placement of a 3.5 x  20 mm Express 2 stent with improvement of TIMI grade 1 flow to TIMI grade 3.   CARDIOVERSION  06/15/2011   Procedure: CARDIOVERSION;  Surgeon: Fay Records, MD;  Location: [facility];   Service: Cardiovascular;  Laterality: N/A;   HERNIA REPAIR N/A    Phreesia 05/09/2020   tonsilectomy      ROS- all systems are personally reviewed and negatives except as per HPI above  Current Outpatient Medications  Medication Sig Dispense Refill   apixaban (ELIQUIS) 5 MG TABS tablet TAKE 1 TABLET TWICE A DAY 180 tablet 1   Boron 3 MG CAPS Take 3 mg by mouth daily.      dofetilide (TIKOSYN) 500 MCG capsule TAKE 1 CAPSULE TWICE A DAY (SCHEDULE APPOINTMENT FOR FUTURE REFILLS) 180 capsule 3   empagliflozin (JARDIANCE) 10 MG TABS tablet Take 1 tablet (10 mg total) by mouth daily before breakfast. 90 tablet 0   ezetimibe (ZETIA) 10 MG tablet TAKE 1 TABLET DAILY 90 tablet 3   Ginger, Zingiber officinalis, (GINGER PO) Take 550 mg by mouth 2 (two) times daily.     Ginkgo Biloba 120 MG TABS Take 120 mg by mouth 2 (two) times daily.     GINSENG PO Take 150 mg by mouth 2 (two) times daily.     glucose blood test strip Use as instructed - 1-2 per day. 70 each 3   HAWTHORN BERRY PO Take 1,500 mg by mouth 2 (two) times daily.     lisinopril (ZESTRIL) 20 MG tablet TAKE 1 TABLET BY MOUTH EVERY DAY IN THE EVENING 90 tablet 3   Magnesium  100 MG TABS Take 400 mg by mouth 2 (two) times daily.     metFORMIN (GLUCOPHAGE) 1000 MG tablet TAKE 1 TABLET (1000 MG TOTAL) BY MOUTH 2 (TWO) TIMES DAILY WITH A MEAL. 180 tablet 1   metoprolol succinate (TOPROL-XL) 25 MG 24 hr tablet TAKE 1 TABLET DAILY 90 tablet 3   Milk Thistle 300 MG CAPS Take 300 mg by mouth 2 (two) times daily.     Multiple Vitamins-Minerals (CENTRUM PO) Take 1 tablet by mouth daily.     nitroGLYCERIN (NITROSTAT) 0.4 MG SL tablet Place 1 tablet (0.4 mg total) under the tongue every 5 (five) minutes as needed. For chest pain at 3rd dose call 911 25 tablet 4   Omega-3 Fatty Acids (FISH OIL PO) Take 1,400 mg by mouth 2 (two) times daily.     RHODIOLA ROSEA PO Take 500 mg by mouth 2 (two) times daily.     rosuvastatin (CRESTOR) 40 MG tablet Take 1  tablet (40 mg total) by mouth daily. 90 tablet 2   tadalafil (CIALIS) 10 MG tablet TAKE 1 TABLET DAILY AS NEEDED FOR ERECTILE DYSFUNCTION. DO NOT USE IF TAKING NITROGLYCERIN 24 tablet 4   No current facility-administered medications for this visit.    Physical Exam: Vitals:   03/06/21 1544  BP: 138/70  Pulse: 69  SpO2: 98%  Weight: 209 lb (94.8 kg)  Height: 5' 11.5" (1.816 m)    GEN- The patient is well appearing, alert and oriented x 3 today.   Head- normocephalic, atraumatic Eyes-  Sclera clear, conjunctiva pink Ears- hearing intact Oropharynx- clear Lungs- Clear to ausculation bilaterally, normal work of breathing Heart- Regular rate and rhythm, no murmurs, rubs or gallops, PMI not laterally displaced GI- soft, NT, ND, + BS Extremities- no clubbing, cyanosis, or edema  EKG tracing ordered today is personally reviewed and shows sinus rhythm, PVCs  Assessment and Plan:  1. Paroxysmal atrial fibrillation Doing well s/p ablation chads2vasc score is 3.  He is on eliquis Reduce toprol to 12.5mg  daily x 6 weeks, then discontinue Consider stopping tikosyn if no AF on return to AF clinic in 3 months  2. HTN Stable No change required today  3. HL Continue crestor 40mg  daily  4. CAD No ischemic symptoms No changes  Return to see AF clinic in 3 months  Thompson Grayer MD, [facility] 03/06/2021 3:50 PM

## 2021-03-06 NOTE — Patient Instructions (Addendum)
Medication Instructions:  Decrease Metoprolol Succinate to 12.5 mg daily for 6 weeks then stop (04/17/21) Your physician recommends that you continue on your current medications as directed. Please refer to the Current Medication list given to you today. *If you need a refill on your cardiac medications before your next appointment, please call your pharmacy*  Lab Work: None. If you have labs (blood work) drawn today and your tests are completely normal, you will receive your results only by: [place] (if you have MyChart) OR A paper copy in the mail If you have any lab test that is abnormal or we need to change your treatment, we will call you to review the results.  Testing/Procedures: None.  Follow-Up: At [facility], you and your health needs are our priority.  As part of our continuing mission to provide you with exceptional heart care, we have created designated Provider Care Teams.  These Care Teams include your primary Cardiologist (physician) and Advanced Practice Providers (APPs -  Physician Assistants and Nurse Practitioners) who all work together to provide you with the care you need, when you need it.  Your physician wants you to follow-up in: Afib Clinic in 3 months they will contact you to schedule.   We recommend signing up for the patient portal called "MyChart".  Sign up information is provided on this After Visit Summary.  MyChart is used to connect with patients for Virtual Visits (Telemedicine).  Patients are able to view lab/test results, encounter notes, upcoming appointments, etc.  Non-urgent messages can be sent to your provider as well.   To learn more about what you can do with MyChart, go to [email or web address].    Any Other Special Instructions Will Be Listed Below (If Applicable).

## 2021-03-07 ENCOUNTER — Other Ambulatory Visit: Payer: Self-pay | Admitting: Family Medicine

## 2021-03-07 DIAGNOSIS — N529 Male erectile dysfunction, unspecified: Secondary | ICD-10-CM

## 2021-03-13 DIAGNOSIS — G4733 Obstructive sleep apnea (adult) (pediatric): Secondary | ICD-10-CM | POA: Diagnosis not present

## 2021-03-17 ENCOUNTER — Telehealth: Payer: Self-pay

## 2021-03-17 NOTE — Telephone Encounter (Signed)
Received fax regarding patient Eliquis Prescription. Per [employer] a prior authorization is requested. Will forward to Prior Auth nurse for assistance with this.

## 2021-03-21 NOTE — Telephone Encounter (Signed)
**Note De-Identified Tessie Ordaz Obfuscation** Gwyndolyn Saxon Rensch Key: [id number] - PA Case ID: [id number] Outcome: This request has been approved using information available on the patient's profile.  Type:Prior Auth;Coverage Start Date:02/19/2021;Coverage End Date:03/21/2022 Drug: Eliquis 5MG  tablets Form: [facility] Electronic PA Form (2017 NCPDP)  [facility] pharmacy is aware of this approval.

## 2021-04-12 DIAGNOSIS — G4733 Obstructive sleep apnea (adult) (pediatric): Secondary | ICD-10-CM | POA: Diagnosis not present

## 2021-05-12 DIAGNOSIS — G4733 Obstructive sleep apnea (adult) (pediatric): Secondary | ICD-10-CM | POA: Diagnosis not present

## 2021-05-29 ENCOUNTER — Ambulatory Visit: Payer: BC Managed Care – PPO | Admitting: Family Medicine

## 2021-05-29 ENCOUNTER — Encounter: Payer: Self-pay | Admitting: Family Medicine

## 2021-05-29 ENCOUNTER — Other Ambulatory Visit: Payer: Self-pay

## 2021-05-29 VITALS — BP 111/69 | HR 70 | Temp 97.8°F | Resp 18 | Ht 71.5 in | Wt 197.6 lb

## 2021-05-29 DIAGNOSIS — Z9989 Dependence on other enabling machines and devices: Secondary | ICD-10-CM

## 2021-05-29 DIAGNOSIS — E785 Hyperlipidemia, unspecified: Secondary | ICD-10-CM

## 2021-05-29 DIAGNOSIS — E1165 Type 2 diabetes mellitus with hyperglycemia: Secondary | ICD-10-CM | POA: Diagnosis not present

## 2021-05-29 DIAGNOSIS — G4733 Obstructive sleep apnea (adult) (pediatric): Secondary | ICD-10-CM | POA: Diagnosis not present

## 2021-05-29 DIAGNOSIS — I10 Essential (primary) hypertension: Secondary | ICD-10-CM | POA: Diagnosis not present

## 2021-05-29 LAB — COMPREHENSIVE METABOLIC PANEL
ALT: 20 U/L (ref 0–53)
AST: 19 U/L (ref 0–37)
Albumin: 4.5 g/dL (ref 3.5–5.2)
Alkaline Phosphatase: 46 U/L (ref 39–117)
BUN: 11 mg/dL (ref 6–23)
CO2: 26 mEq/L (ref 19–32)
Calcium: 9.1 mg/dL (ref 8.4–10.5)
Chloride: 102 mEq/L (ref 96–112)
Creatinine, Ser: 0.86 mg/dL (ref 0.40–1.50)
GFR: 93.87 mL/min (ref >60.00)
Glucose, Bld: 112 mg/dL — ABNORMAL HIGH (ref 70–99)
Potassium: 4.4 mEq/L (ref 3.5–5.1)
Sodium: 138 mEq/L (ref 135–145)
Total Bilirubin: 0.6 mg/dL (ref 0.2–1.2)
Total Protein: 6.6 g/dL (ref 6.0–8.3)

## 2021-05-29 LAB — POCT GLYCOSYLATED HEMOGLOBIN (HGB A1C): Hemoglobin A1C: 6.5 % — AB (ref 4.0–5.6)

## 2021-05-29 LAB — LIPID PANEL
Cholesterol: 116 mg/dL (ref 0–200)
HDL: 33.6 mg/dL — ABNORMAL LOW (ref >39.00)
LDL Cholesterol: 46 mg/dL (ref 0–99)
NonHDL: 82.78
Total CHOL/HDL Ratio: 3
Triglycerides: 185 mg/dL — ABNORMAL HIGH (ref 0.0–149.0)
VLDL: 37 mg/dL (ref 0.0–40.0)

## 2021-05-29 MED ORDER — METFORMIN HCL 1000 MG PO TABS: ORAL_TABLET | ORAL | 1 refills | Status: DC

## 2021-05-29 MED ORDER — EMPAGLIFLOZIN 10 MG PO TABS: 10.0000 mg | ORAL_TABLET | Freq: Every day | ORAL | 1 refills | Status: DC

## 2021-05-29 NOTE — Progress Notes (Signed)
Subjective:  Patient ID: Scott White, male    DOB: 05-03-1960  Age: 61 y.o. MRN: [id number]  CC:  Chief Complaint  Patient presents with   Follow-up    Patient states he is here for 3 month follow up on diabetes.    HPI Scott White presents for   Diabetes: Complicated by hyperglycemia, CAD.  Treated with metformin 1000 mg twice daily, jardiance '10mg'$  qd since December.  He is on ACE inhibitor, statin, Zetia. Home readings  Fasting 120-140 Postprandial up to 200.  No sx lows.  Microalbumin: normal ratio in 10/2020.  Optho, foot exam, pneumovax:   Lab Results  Component Value Date   HGBA1C 6.5 (A) 05/29/2021   HGBA1C 7.5 (H) 02/24/2021   HGBA1C 7.9 (H) 11/09/2020   Lab Results  Component Value Date   MICROALBUR <0.7 11/09/2020   LDLCALC 51 05/11/2020   CREATININE 0.90 11/15/2020   Hypertension: With CAD, A-fib, OSA.  Cardiologist Dr. Stanford Breed, electrophysiology Dr. Rayann Heman.  Ablation in Sept 2022. Anticoagulation with Eliquis, rate control with Toprol - decreased dose, then tapered off. Considering stopping tikosyn if no AF at follow up with afib clinic.  Lisinopril for hypertension.  Home readings: BP Readings from Last 3 Encounters:  05/29/21 111/69  03/06/21 138/70  02/24/21 126/70   Lab Results  Component Value Date   CREATININE 0.90 11/15/2020   Hyperlipidemia: Crestor 40 mg daily, Zetia 10 mg daily. Lab Results  Component Value Date   CHOL 119 11/09/2020   HDL 36.20 (L) 11/09/2020   LDLCALC 51 05/11/2020   LDLDIRECT 65.0 11/09/2020   TRIG 212.0 (H) 11/09/2020   CHOLHDL 3 11/09/2020   Lab Results  Component Value Date   ALT 33 05/11/2020   AST 30 05/11/2020   ALKPHOS 49 05/11/2020   BILITOT 0.4 05/11/2020       History Patient Active Problem List   Diagnosis Date Noted   Complex sleep apnea syndrome 09/22/2010   Long term (current) use of anticoagulants 07/03/2010   Long term current use of anticoagulant 05/31/2010    DYSLIPIDEMIA 09/27/2008   HYPERTRIGLYCERIDEMIA 02/11/2008   ESSENTIAL HYPERTENSION, BENIGN 02/11/2008   CORONARY ATHEROSCLEROSIS NATIVE CORONARY ARTERY 02/11/2008   ATRIAL FIBRILLATION 02/11/2008   Past Medical History:  Diagnosis Date   Atrial fibrillation ([place])    a. Persistent, s/p afib ablation by JA 08/16/10;  b. s/p failed DCCV 05/2011; c. Chronic Tikosyn & Pradaxa (CHA2DS2VASc = 3-4).   CAD (coronary artery disease)    a. 07/2002 Inf MI s/p BMS -->RCA, otw nonobs dzs;  b. 12/2011 Neg MV.   Cancer ([place])    Phreesia 05/09/2020   CHF (congestive heart failure) ([place])    Phreesia 05/09/2020   Chronic diastolic CHF (congestive heart failure) (HCC)    Diabetes ([place])    Diabetes mellitus without complication ([place])    Phreesia 05/09/2020   HTN (hypertension)    Hyperlipidemia    Hypertension    Phreesia 05/09/2020   Myocardial infarction ([place])    Phreesia 05/09/2020   PVC's (premature ventricular contractions)    Sleep apnea    followed by Dr Gwenette Greet   Past Surgical History:  Procedure Laterality Date   atrial ablatio  08/16/10   afib ablation by [place] N/A 11/29/2020   Procedure: ATRIAL FIBRILLATION ABLATION;  Surgeon: Thompson Grayer, MD;  Location: [place] [facility];  Service: Cardiovascular;  Laterality: N/A;   CARDIAC CATHETERIZATION  2004    Successful PTCA and  stent placement in the mid right   coronary artery with an extra 99% narrowing to 0% with placement of a 3.5 x  20 mm Express 2 stent with improvement of TIMI grade 1 flow to TIMI grade 3.   CARDIOVERSION  06/15/2011   Procedure: CARDIOVERSION;  Surgeon: Fay Records, MD;  Location: [place];  Service: Cardiovascular;  Laterality: N/A;   HERNIA REPAIR N/A    Phreesia 05/09/2020   tonsilectomy     No Known Allergies Prior to Admission medications   Medication Sig Start Date End Date Taking? Authorizing Provider  apixaban (ELIQUIS) 5 MG TABS tablet TAKE 1 TABLET TWICE A DAY 12/12/20  Yes  Lelon Perla, MD  Boron 3 MG CAPS Take 3 mg by mouth daily.    Yes [provider]  dofetilide (TIKOSYN) 500 MCG capsule TAKE 1 CAPSULE TWICE A DAY (SCHEDULE APPOINTMENT FOR FUTURE REFILLS) 10/12/20  Yes Lelon Perla, MD  empagliflozin (JARDIANCE) 10 MG TABS tablet Take 1 tablet (10 mg total) by mouth daily before breakfast. 02/24/21  Yes Maximiano Coss, NP  ezetimibe (ZETIA) 10 MG tablet TAKE 1 TABLET DAILY 02/27/21  Yes Lelon Perla, MD  Ginger, Zingiber officinalis, (GINGER PO) Take 550 mg by mouth 2 (two) times daily.   Yes [provider]  Ginkgo Biloba 120 MG TABS Take 120 mg by mouth 2 (two) times daily.   Yes [provider]  GINSENG PO Take 150 mg by mouth 2 (two) times daily.   Yes [provider]  glucose blood test strip Use as instructed - 1-2 per day. 01/23/21  Yes [name], MD  HAWTHORN BERRY PO Take 1,500 mg by mouth 2 (two) times daily.   Yes [provider]  lisinopril (ZESTRIL) 20 MG tablet TAKE 1 TABLET BY MOUTH EVERY DAY IN THE EVENING 10/24/20  Yes Crenshaw, Denice Bors, MD  Magnesium 100 MG TABS Take 400 mg by mouth 2 (two) times daily.   Yes [provider]  metFORMIN (GLUCOPHAGE) 1000 MG tablet TAKE 1 TABLET (1000 MG TOTAL) BY MOUTH 2 (TWO) TIMES DAILY WITH A MEAL. 02/24/21  Yes Maximiano Coss, NP  Milk Thistle 300 MG CAPS Take 300 mg by mouth 2 (two) times daily.   Yes [provider]  Multiple Vitamins-Minerals (CENTRUM PO) Take 1 tablet by mouth daily.   Yes [provider]  nitroGLYCERIN (NITROSTAT) 0.4 MG SL tablet Place 1 tablet (0.4 mg total) under the tongue every 5 (five) minutes as needed. For chest pain at 3rd dose call 911 06/09/19  Yes Crenshaw, Denice Bors, MD  Omega-3 Fatty Acids (FISH OIL PO) Take 1,400 mg by mouth 2 (two) times daily.   Yes [provider]  RHODIOLA ROSEA PO Take 500 mg by mouth 2 (two) times daily.   Yes [provider]  rosuvastatin  (CRESTOR) 40 MG tablet Take 1 tablet (40 mg total) by mouth daily. 11/09/20  Yes [name], MD  tadalafil (CIALIS) 10 MG tablet TAKE 1 TABLET DAILY AS NEEDED FOR ERECTILE DYSFUNCTION. DO NOT USE IF TAKING NITROGLYCERIN 03/07/21  Yes [name], MD  metoprolol succinate (TOPROL XL) 25 MG 24 hr tablet Take 0.5 tablets (12.5 mg total) by mouth daily. 03/06/21 04/17/21  Thompson Grayer, MD   Social History   Socioeconomic History   Marital status: Single    Spouse name: Not on file   Number of children: 0   Years of education: Not on file  Highest education level: Not on file  Occupational History   Occupation: Pharmacist, community: [employer]  Tobacco Use   Smoking status: Never   Smokeless tobacco: Never  Vaping Use   Vaping Use: Never used  Substance and Sexual Activity   Alcohol use: Yes    Alcohol/week: 1.0 standard drink    Types: 1 Cans of beer per week    Comment:  three times a week   Drug use: No   Sexual activity: Yes  Other Topics Concern   Not on file  Social History Narrative   Pt lives in [place] alone.  Single.   He is a Teacher, English as a foreign language for [employer]   Social Determinants of Health   Financial Resource Strain: Not on file  Food Insecurity: Not on file  Transportation Needs: Not on file  Physical Activity: Not on file  Stress: Not on file  Social Connections: Not on file  Intimate Partner Violence: Not on file    Review of Systems  Constitutional:  Negative for fatigue and unexpected weight change.  Eyes:  Negative for visual disturbance.  Respiratory:  Negative for cough, chest tightness and shortness of breath.   Cardiovascular:  Negative for chest pain, palpitations and leg swelling.  Gastrointestinal:  Negative for abdominal pain and blood in stool.  Neurological:  Negative for dizziness, light-headedness and headaches.    Objective:   Vitals:   05/29/21 0840  BP: 111/69  Pulse: 70  Resp: 18  Temp:  97.8 F (36.6 C)  TempSrc: Temporal  SpO2: 99%  Weight: 197 lb 9.6 oz (89.6 kg)  Height: 5' 11.5" (1.816 m)     Physical Exam Vitals reviewed.  Constitutional:      Appearance: He is well-developed.  HENT:     Head: Normocephalic and atraumatic.  Neck:     Vascular: No carotid bruit or JVD.  Cardiovascular:     Rate and Rhythm: Normal rate and regular rhythm.     Heart sounds: Normal heart sounds. No murmur heard. Pulmonary:     Effort: Pulmonary effort is normal.     Breath sounds: Normal breath sounds. No rales.  Musculoskeletal:     Right lower leg: No edema.     Left lower leg: No edema.  Skin:    General: Skin is warm and dry.  Neurological:     Mental Status: He is alert and oriented to person, place, and time.  Psychiatric:        Mood and Affect: Mood normal.       Assessment & Plan:  Scott White is a 61 y.o. male . Type 2 diabetes mellitus with hyperglycemia, without long-term current use of insulin (HCC) - Plan: POCT glycosylated hemoglobin (Hb A1C), metFORMIN (GLUCOPHAGE) 1000 MG tablet, empagliflozin (JARDIANCE) 10 MG TABS tablet  -Improved control with above regimen.  Continue metformin and Jardiance.  Recheck 6 months, sooner if higher home readings, hypoglycemia or new side effects.  Hyperlipidemia, unspecified hyperlipidemia type - Plan: Comprehensive metabolic panel, Lipid panel  -Tolerating current regimen, check labs  Essential hypertension  -Stable including off Toprol.  Continue lisinopril and routine cardiology follow-up.  OSA on CPAP Compliant with CPAP.  Meds ordered this encounter  Medications   metFORMIN (GLUCOPHAGE) 1000 MG tablet    Sig: TAKE 1 TABLET (1000 MG TOTAL) BY MOUTH 2 (TWO) TIMES DAILY WITH A MEAL.    Dispense:  180 tablet    Refill:  1  empagliflozin (JARDIANCE) 10 MG TABS tablet    Sig: Take 1 tablet (10 mg total) by mouth daily before breakfast.    Dispense:  90 tablet    Refill:  1   Patient Instructions    A1c looks much better today.  No med changes at this time.  I will let you know if there are any concerns on the send out labs.  Follow-up in 6 months but let me know if there are questions sooner or if there are any changes in your blood sugar sooner I am happy to see you back in 3 months for updated testing.  Take care!   If you have lab work done today you will be contacted with your lab results within the next 2 weeks.  If you have not heard from [place] then please contact us. The fastest way to get your results is to register for My Chart.   IF you received an x-ray today, you will receive an invoice from [facility]. Please contact [facility] at [phone] with questions or concerns regarding your invoice.   IF you received labwork today, you will receive an invoice from [place]. Please contact [facility] at [phone] with questions or concerns regarding your invoice.   Our billing staff will not be able to assist you with questions regarding bills from these companies.  You will be contacted with the lab results as soon as they are available. The fastest way to get your results is to activate your My Chart account. Instructions are located on the last page of this paperwork. If you have not heard from [place] regarding the results in 2 weeks, please contact this office.        Signed,   Merri Ray, MD [facility] 05/29/21 9:24 AM

## 2021-05-29 NOTE — Patient Instructions (Addendum)
?  A1c looks much better today.  No med changes at this time.  I will let you know if there are any concerns on the send out labs.  Follow-up in 6 months but let me know if there are questions sooner or if there are any changes in your blood sugar sooner I am happy to see you back in 3 months for updated testing.  Take care! ? ? ?If you have lab work done today you will be contacted with your lab results within the next 2 weeks.  If you have not heard from [place] then please contact us. The fastest way to get your results is to register for My Chart. ? ? ?IF you received an x-ray today, you will receive an invoice from [facility]. Please contact [facility] at [phone] with questions or concerns regarding your invoice.  ? ?IF you received labwork today, you will receive an invoice from [place]. Please contact [facility] at [phone] with questions or concerns regarding your invoice.  ? ?Our billing staff will not be able to assist you with questions regarding bills from these companies. ? ?You will be contacted with the lab results as soon as they are available. The fastest way to get your results is to activate your My Chart account. Instructions are located on the last page of this paperwork. If you have not heard from [place] regarding the results in 2 weeks, please contact this office. ?  ? ? ?

## 2021-06-08 ENCOUNTER — Telehealth (HOSPITAL_COMMUNITY): Payer: Self-pay | Admitting: Nurse Practitioner

## 2021-06-08 NOTE — Telephone Encounter (Signed)
Called and left message for patient to call back.  I received message pt wanted to schedule his recall appt. ?

## 2021-06-14 ENCOUNTER — Encounter: Payer: Self-pay | Admitting: Cardiology

## 2021-06-15 ENCOUNTER — Other Ambulatory Visit: Payer: Self-pay

## 2021-06-15 DIAGNOSIS — G4733 Obstructive sleep apnea (adult) (pediatric): Secondary | ICD-10-CM | POA: Diagnosis not present

## 2021-06-15 MED ORDER — APIXABAN 5 MG PO TABS: 5.0000 mg | ORAL_TABLET | Freq: Two times a day (BID) | ORAL | 1 refills | Status: DC

## 2021-06-15 NOTE — Telephone Encounter (Signed)
Prescription refill request for Eliquis received. ?Indication:Afib ?Last office visit:12/22 ?Scr:0.8 ?Age: 61 ?Weight:89.6 kg ? ?Prescription refilled ? ?

## 2021-06-21 ENCOUNTER — Encounter (HOSPITAL_COMMUNITY): Payer: Self-pay | Admitting: Nurse Practitioner

## 2021-06-21 ENCOUNTER — Ambulatory Visit (HOSPITAL_COMMUNITY)
Admission: RE | Admit: 2021-06-21 | Discharge: 2021-06-21 | Disposition: A | Discharge status: Home / Self Care | Payer: BC Managed Care – PPO | Source: Ambulatory Visit | Attending: Nurse Practitioner | Admitting: Nurse Practitioner

## 2021-06-21 VITALS — BP 130/72 | HR 89 | Ht 71.5 in | Wt 203.2 lb

## 2021-06-21 DIAGNOSIS — Z7901 Long term (current) use of anticoagulants: Secondary | ICD-10-CM | POA: Insufficient documentation

## 2021-06-21 DIAGNOSIS — Z79899 Other long term (current) drug therapy: Secondary | ICD-10-CM | POA: Diagnosis not present

## 2021-06-21 DIAGNOSIS — I10 Essential (primary) hypertension: Secondary | ICD-10-CM | POA: Diagnosis not present

## 2021-06-21 DIAGNOSIS — I480 Paroxysmal atrial fibrillation: Secondary | ICD-10-CM

## 2021-06-21 DIAGNOSIS — D6869 Other thrombophilia: Secondary | ICD-10-CM | POA: Diagnosis not present

## 2021-06-21 DIAGNOSIS — I4819 Other persistent atrial fibrillation: Secondary | ICD-10-CM | POA: Diagnosis not present

## 2021-06-21 LAB — MAGNESIUM: Magnesium: 2 mg/dL (ref 1.7–2.4)

## 2021-06-21 NOTE — Progress Notes (Signed)
? ?Primary Care Physician: [name], MD ?Referring Physician: Dr. Rayann Heman  ? ? ?Scott White is a 61 y.o. male with a h/o afib ablation x 2, las one was  11/2020.  He is doing well staying in rhythm since the  procedure. He continues on dofetilide. BB was stopped and did well with that after last appointment with Dr. Rayann Heman. He also mentioned he could try to stop Tikosyn if he continued with SR. Pt would like to stay on  until at least August and further discuss with Dr. Stanford Breed.  ? ?Today, he denies symptoms of palpitations, chest pain, shortness of breath, orthopnea, PND, lower extremity edema, dizziness, presyncope, syncope, or neurologic sequela. The patient is tolerating medications without difficulties and is otherwise without complaint today.  ? ?Past Medical History:  ?Diagnosis Date  ? Atrial fibrillation ([place])   ? a. Persistent, s/p afib ablation by JA 08/16/10;  b. s/p failed DCCV 05/2011; c. Chronic Tikosyn & Pradaxa (CHA2DS2VASc = 3-4).  ? CAD (coronary artery disease)   ? a. 07/2002 Inf MI s/p BMS -->RCA, otw nonobs dzs;  b. 12/2011 Neg MV.  ? Cancer [facility])   ? Phreesia 05/09/2020  ? CHF (congestive heart failure) ([place])   ? Phreesia 05/09/2020  ? Chronic diastolic CHF (congestive heart failure) ([place])   ? Diabetes ([place])   ? Diabetes mellitus without complication ([place])   ? Phreesia 05/09/2020  ? HTN (hypertension)   ? Hyperlipidemia   ? Hypertension   ? Phreesia 05/09/2020  ? Myocardial infarction [facility])   ? Phreesia 05/09/2020  ? PVC's (premature ventricular contractions)   ? Sleep apnea   ? followed by Dr Gwenette Greet  ? ?Past Surgical History:  ?Procedure Laterality Date  ? atrial ablatio  08/16/10  ? afib ablation by JA  ? ATRIAL FIBRILLATION ABLATION N/A 11/29/2020  ? Procedure: ATRIAL FIBRILLATION ABLATION;  Surgeon: Thompson Grayer, MD;  Location: [place] [facility];  Service: Cardiovascular;  Laterality: N/A;  ? CARDIAC CATHETERIZATION  2004  ?  Successful PTCA and stent placement in the mid right    coronary artery with an extra 99% narrowing to 0% with placement of a 3.5 x  20 mm Express 2 stent with improvement of TIMI grade 1 flow to TIMI grade 3.  ? CARDIOVERSION  06/15/2011  ? Procedure: CARDIOVERSION;  Surgeon: Fay Records, MD;  Location: [place];  Service: Cardiovascular;  Laterality: N/A;  ? HERNIA REPAIR N/A   ? Phreesia 05/09/2020  ? tonsilectomy    ? ? ?Current Outpatient Medications  ?Medication Sig Dispense Refill  ? apixaban (ELIQUIS) 5 MG TABS tablet Take 1 tablet (5 mg total) by mouth 2 (two) times daily. 180 tablet 1  ? Boron 3 MG CAPS Take 3 mg by mouth daily.     ? dofetilide (TIKOSYN) 500 MCG capsule TAKE 1 CAPSULE TWICE A DAY (SCHEDULE APPOINTMENT FOR FUTURE REFILLS) 180 capsule 3  ? empagliflozin (JARDIANCE) 10 MG TABS tablet Take 1 tablet (10 mg total) by mouth daily before breakfast. 90 tablet 1  ? ezetimibe (ZETIA) 10 MG tablet TAKE 1 TABLET DAILY 90 tablet 3  ? Ginger, Zingiber officinalis, (GINGER PO) Take 550 mg by mouth 4 (four) times a week.    ? Ginkgo Biloba 120 MG TABS Take 120 mg by mouth 2 (two) times daily.    ? GINSENG PO Take 150 mg by mouth 2 (two) times daily.    ? glucose blood test strip Use as instructed -  1-2 per day. 70 each 3  ? HAWTHORN BERRY PO Take 1,500 mg by mouth 2 (two) times daily.    ? lisinopril (ZESTRIL) 20 MG tablet TAKE 1 TABLET BY MOUTH EVERY DAY IN THE EVENING 90 tablet 3  ? Magnesium 100 MG TABS Take 400 mg by mouth 2 (two) times daily.    ? metFORMIN (GLUCOPHAGE) 1000 MG tablet TAKE 1 TABLET (1000 MG TOTAL) BY MOUTH 2 (TWO) TIMES DAILY WITH A MEAL. 180 tablet 1  ? Milk Thistle 300 MG CAPS Take 300 mg by mouth 2 (two) times daily.    ? Multiple Vitamins-Minerals (CENTRUM PO) Take 1 tablet by mouth daily.    ? nitroGLYCERIN (NITROSTAT) 0.4 MG SL tablet Place 1 tablet (0.4 mg total) under the tongue every 5 (five) minutes as needed. For chest pain at 3rd dose call 911 25 tablet 4  ? Omega-3 Fatty Acids (FISH OIL PO) Take 1,400 mg by mouth 2 (two) times  daily.    ? RHODIOLA ROSEA PO Take 500 mg by mouth 2 (two) times daily.    ? rosuvastatin (CRESTOR) 40 MG tablet Take 1 tablet (40 mg total) by mouth daily. 90 tablet 2  ? tadalafil (CIALIS) 10 MG tablet TAKE 1 TABLET DAILY AS NEEDED FOR ERECTILE DYSFUNCTION. DO NOT USE IF TAKING NITROGLYCERIN 24 tablet 5  ? ?No current facility-administered medications for this encounter.  ? ? ?No Known Allergies ? ?Social History  ? ?Socioeconomic History  ? Marital status: Single  ?  Spouse name: Not on file  ? Number of children: 0  ? Years of education: Not on file  ? Highest education level: Not on file  ?Occupational History  ? Occupation: Teacher, English as a foreign language  ?  Employer: [employer]  ?Tobacco Use  ? Smoking status: Never  ? Smokeless tobacco: Never  ?Vaping Use  ? Vaping Use: Never used  ?Substance and Sexual Activity  ? Alcohol use: Yes  ?  Alcohol/week: 1.0 standard drink  ?  Types: 1 Cans of beer per week  ?  Comment:  three times a week  ? Drug use: No  ? Sexual activity: Yes  ?Other Topics Concern  ? Not on file  ?Social History Narrative  ? Pt lives in [place] alone.  Single.  ? He is a Teacher, English as a foreign language for [employer]  ? ?Social Determinants of Health  ? ?Financial Resource Strain: Not on file  ?Food Insecurity: Not on file  ?Transportation Needs: Not on file  ?Physical Activity: Not on file  ?Stress: Not on file  ?Social Connections: Not on file  ?Intimate Partner Violence: Not on file  ? ? ?Family History  ?Problem Relation Age of Onset  ? Coronary artery disease Mother   ? Heart failure Mother   ? Heart disease Mother   ? Hyperlipidemia Mother   ? Hypertension Mother   ? Alzheimer's disease Father   ? Diabetes Father   ? Diabetes Brother   ? Heart disease Brother   ? Hyperlipidemia Brother   ? Hypertension Brother   ? Alzheimer's disease Brother   ? Hyperlipidemia Brother   ? Colon cancer Neg Hx   ? Pancreatic cancer Neg Hx   ? Rectal cancer Neg Hx   ? Stomach cancer Neg Hx   ? ? ?ROS- All systems  are reviewed and negative except as per the HPI above ? ?Physical Exam: ?Vitals:  ? 06/21/21 1541  ?BP: 130/72  ?Pulse: 89  ?Weight: 92.2 kg  ?  Height: 5' 11.5" (1.816 m)  ? ?Wt Readings from Last 3 Encounters:  ?06/21/21 92.2 kg  ?05/29/21 89.6 kg  ?03/06/21 94.8 kg  ? ? ?Labs: ?Lab Results  ?Component Value Date  ? NA 138 05/29/2021  ? K 4.4 05/29/2021  ? CL 102 05/29/2021  ? CO2 26 05/29/2021  ? GLUCOSE 112 (H) 05/29/2021  ? BUN 11 05/29/2021  ? CREATININE 0.86 05/29/2021  ? CALCIUM 9.1 05/29/2021  ? MG 1.9 12/27/2020  ? ?Lab Results  ?Component Value Date  ? INR 1.21 07/29/2011  ? ?Lab Results  ?Component Value Date  ? CHOL 116 05/29/2021  ? HDL 33.60 (L) 05/29/2021  ? LDLCALC 46 05/29/2021  ? TRIG 185.0 (H) 05/29/2021  ? ? ? ?GEN- The patient is well appearing, alert and oriented x 3 today.   ?Head- normocephalic, atraumatic ?Eyes-  Sclera clear, conjunctiva pink ?Ears- hearing intact ?Oropharynx- clear ?Neck- supple, no JVP ?Lymph- no cervical lymphadenopathy ?Lungs- Clear to ausculation bilaterally, normal work of breathing ?Heart- Regular rate and rhythm, no murmurs, rubs or gallops, PMI not laterally displaced ?GI- soft, NT, ND, + BS ?Extremities- no clubbing, cyanosis, or edema ?MS- no significant deformity or atrophy ?Skin- no rash or lesion ?Psych- euthymic mood, full affect ?Neuro- strength and sensation are intact ? ?EKG-Vent. rate 89 BPM ?PR interval 178 ms ?QRS duration 90 ms ?QT/QTcB 386/469 ms ?P-R-T axes 60 96 4 ?Normal sinus rhythm ?Rightward axis ?Borderline ECG ?When compared with ECG of 27-Dec-2020 15:36, ?PREVIOUS ECG IS PRESENT ? ?Assessment and Plan:  ?1. Persistent afib  ?S/p 2nd ablation September 2022 and is staying in [place]  ?Continue dofetilide 500 mcg bid  ?Qtc stable  ?Pt will discuss with Dr. Stanford Breed re coming off Tikosyn when he sees him in August  ?Now off BB for a few months  ? ?2. CHA2DS2VASc  score of 3 ?Continue eliquis 5 mg bid  ? ?3. HTN ?Stable  ? ?F/u with Crenshaw in August  as scheduled and in afib clinic in October if still on tikosyn  ? ? ?Geroge Baseman Kayleen Memos, ANP-C ?Afib Clinic ?[facility] ?[address] ?[place], [place] [address] ?[phone]  ? ?

## 2021-07-15 DIAGNOSIS — G4733 Obstructive sleep apnea (adult) (pediatric): Secondary | ICD-10-CM | POA: Diagnosis not present

## 2021-08-14 DIAGNOSIS — G4733 Obstructive sleep apnea (adult) (pediatric): Secondary | ICD-10-CM | POA: Diagnosis not present

## 2021-09-06 ENCOUNTER — Other Ambulatory Visit: Payer: Self-pay | Admitting: Family Medicine

## 2021-09-06 DIAGNOSIS — E785 Hyperlipidemia, unspecified: Secondary | ICD-10-CM

## 2021-09-11 ENCOUNTER — Encounter: Payer: Self-pay | Admitting: Family Medicine

## 2021-09-11 DIAGNOSIS — G4733 Obstructive sleep apnea (adult) (pediatric): Secondary | ICD-10-CM

## 2021-09-13 DIAGNOSIS — G4733 Obstructive sleep apnea (adult) (pediatric): Secondary | ICD-10-CM | POA: Diagnosis not present

## 2021-10-04 NOTE — Progress Notes (Deleted)
HPI: FU atrial fibrillation, coronary disease, hypertension and hyperlipidemia. Patient has had previous PCI of his right coronary artery in the setting of an acute infarct in 2004. No obstructive disease in the left system. Nuclear study in October of 2013 showed an ejection fraction of 50%. There was no ischemia.   Echocardiogram January 2021 showed normal LV function. Cardiac CT prior to atrial fibrillation ablation September 2022 showed calcium score 1956 which was 99th percentile.  Patient had atrial fibrillation ablation September 2022.  Since last seen,   Current Outpatient Medications  Medication Sig Dispense Refill   apixaban (ELIQUIS) 5 MG TABS tablet Take 1 tablet (5 mg total) by mouth 2 (two) times daily. 180 tablet 1   Boron 3 MG CAPS Take 3 mg by mouth daily.      dofetilide (TIKOSYN) 500 MCG capsule TAKE 1 CAPSULE TWICE A DAY (SCHEDULE APPOINTMENT FOR FUTURE REFILLS) 180 capsule 3   empagliflozin (JARDIANCE) 10 MG TABS tablet Take 1 tablet (10 mg total) by mouth daily before breakfast. 90 tablet 1   ezetimibe (ZETIA) 10 MG tablet TAKE 1 TABLET DAILY 90 tablet 3   Ginger, Zingiber officinalis, (GINGER PO) Take 550 mg by mouth 4 (four) times a week.     Ginkgo Biloba 120 MG TABS Take 120 mg by mouth 2 (two) times daily.     GINSENG PO Take 150 mg by mouth 2 (two) times daily.     glucose blood test strip Use as instructed - 1-2 per day. 70 each 3   HAWTHORN BERRY PO Take 1,500 mg by mouth 2 (two) times daily.     lisinopril (ZESTRIL) 20 MG tablet TAKE 1 TABLET BY MOUTH EVERY DAY IN THE EVENING 90 tablet 3   Magnesium 100 MG TABS Take 400 mg by mouth 2 (two) times daily.     metFORMIN (GLUCOPHAGE) 1000 MG tablet TAKE 1 TABLET (1000 MG TOTAL) BY MOUTH 2 (TWO) TIMES DAILY WITH A MEAL. 180 tablet 1   Milk Thistle 300 MG CAPS Take 300 mg by mouth 2 (two) times daily.     Multiple Vitamins-Minerals (CENTRUM PO) Take 1 tablet by mouth daily.     nitroGLYCERIN (NITROSTAT) 0.4 MG SL  tablet Place 1 tablet (0.4 mg total) under the tongue every 5 (five) minutes as needed. For chest pain at 3rd dose call 911 25 tablet 4   Omega-3 Fatty Acids (FISH OIL PO) Take 1,400 mg by mouth 2 (two) times daily.     RHODIOLA ROSEA PO Take 500 mg by mouth 2 (two) times daily.     rosuvastatin (CRESTOR) 40 MG tablet TAKE 1 TABLET DAILY 90 tablet 3   tadalafil (CIALIS) 10 MG tablet TAKE 1 TABLET DAILY AS NEEDED FOR ERECTILE DYSFUNCTION. DO NOT USE IF TAKING NITROGLYCERIN 24 tablet 5   No current facility-administered medications for this visit.     Past Medical History:  Diagnosis Date   Atrial fibrillation ([place])    a. Persistent, s/p afib ablation by [facility] 08/16/10;  b. s/p failed DCCV 05/2011; c. Chronic Tikosyn & Pradaxa (CHA2DS2VASc = 3-4).   CAD (coronary artery disease)    a. 07/2002 Inf MI s/p BMS -->RCA, otw nonobs dzs;  b. 12/2011 Neg MV.   Cancer [facility])    Phreesia 05/09/2020   CHF (congestive heart failure) ([place])    Phreesia 05/09/2020   Chronic diastolic CHF (congestive heart failure) ([facility])    Diabetes ([place])    Diabetes mellitus without complication (  [place])    Phreesia 05/09/2020   HTN (hypertension)    Hyperlipidemia    Hypertension    Phreesia 05/09/2020   Myocardial infarction ([place])    Phreesia 05/09/2020   PVC's (premature ventricular contractions)    Sleep apnea    followed by Dr Gwenette Greet    Past Surgical History:  Procedure Laterality Date   atrial ablatio  08/16/10   afib ablation by [place] N/A 11/29/2020   Procedure: ATRIAL FIBRILLATION ABLATION;  Surgeon: Thompson Grayer, MD;  Location: [place] [facility];  Service: Cardiovascular;  Laterality: N/A;   CARDIAC CATHETERIZATION  2004    Successful PTCA and stent placement in the mid right   coronary artery with an extra 99% narrowing to 0% with placement of a 3.5 x  20 mm Express 2 stent with improvement of TIMI grade 1 flow to TIMI grade 3.   CARDIOVERSION  06/15/2011   Procedure:  CARDIOVERSION;  Surgeon: Fay Records, MD;  Location: [place];  Service: Cardiovascular;  Laterality: N/A;   HERNIA REPAIR N/A    Phreesia 05/09/2020   tonsilectomy      Social History   Socioeconomic History   Marital status: Single    Spouse name: Not on file   Number of children: 0   Years of education: Not on file   Highest education level: Not on file  Occupational History   Occupation: Pharmacist, community: [employer]  Tobacco Use   Smoking status: Never   Smokeless tobacco: Never  Vaping Use   Vaping Use: Never used  Substance and Sexual Activity   Alcohol use: Yes    Alcohol/week: 1.0 standard drink of alcohol    Types: 1 Cans of beer per week    Comment:  three times a week   Drug use: No   Sexual activity: Yes  Other Topics Concern   Not on file  Social History Narrative   Pt lives in [place] alone.  Single.   He is a Teacher, English as a foreign language for [employer]   Social Determinants of Health   Financial Resource Strain: Not on file  Food Insecurity: Not on file  Transportation Needs: Not on file  Physical Activity: Not on file  Stress: Not on file  Social Connections: Not on file  Intimate Partner Violence: Not on file    Family History  Problem Relation Age of Onset   Coronary artery disease Mother    Heart failure Mother    Heart disease Mother    Hyperlipidemia Mother    Hypertension Mother    Alzheimer's disease Father    Diabetes Father    Diabetes Brother    Heart disease Brother    Hyperlipidemia Brother    Hypertension Brother    Alzheimer's disease Brother    Hyperlipidemia Brother    Colon cancer Neg Hx    Pancreatic cancer Neg Hx    Rectal cancer Neg Hx    Stomach cancer Neg Hx     ROS: no fevers or chills, productive cough, hemoptysis, dysphasia, odynophagia, melena, hematochezia, dysuria, hematuria, rash, seizure activity, orthopnea, PND, pedal edema, claudication. Remaining systems are negative.  Physical  Exam: Well-developed well-nourished in no acute distress.  Skin is warm and dry.  HEENT is normal.  Neck is supple.  Chest is clear to auscultation with normal expansion.  Cardiovascular exam is regular rate and rhythm.  Abdominal exam nontender or distended. No masses palpated. Extremities  show no edema. neuro grossly intact  ECG- personally reviewed  A/P  1 paroxysmal atrial fibrillation-patient remains in sinus rhythm today.  We will continue apixaban.  Patient would like to discontinue dofetilide as he has been stable since previous ablation.  We will therefore discontinue and follow for recurrences.  2 coronary artery disease-continue statin.  No aspirin given need for apixaban.  No chest pain.  3 hypertension-blood pressure controlled.  Continue present medical regimen.  4 hyperlipidemia-continue present medications.  5 obstructive sleep apnea-continue CPAP.  Kirk Ruths, MD

## 2021-10-06 ENCOUNTER — Encounter: Payer: Self-pay | Admitting: Internal Medicine

## 2021-10-06 ENCOUNTER — Telehealth: Payer: Self-pay | Admitting: Cardiology

## 2021-10-06 MED ORDER — DOFETILIDE 500 MCG PO CAPS: 500.0000 ug | ORAL_CAPSULE | Freq: Two times a day (BID) | ORAL | 0 refills | Status: DC

## 2021-10-06 NOTE — Telephone Encounter (Signed)
Refills has been sent to the pharmacy. 

## 2021-10-06 NOTE — Telephone Encounter (Signed)
 *  STAT* If patient is at the pharmacy, call can be transferred to refill team.   1. Which medications need to be refilled? (please list name of each medication and dose if known)   dofetilide (TIKOSYN) 500 MCG capsule  2. Which pharmacy/location (including street and city if local pharmacy) is medication to be sent to?  [facility] [place], [place]  3. Do they need a 30 day or 90 day supply? 90   Patient has appt on 10/18/21 with [facility]

## 2021-10-06 NOTE — Telephone Encounter (Signed)
errir 

## 2021-10-10 ENCOUNTER — Ambulatory Visit (INDEPENDENT_AMBULATORY_CARE_PROVIDER_SITE_OTHER): Payer: BC Managed Care – PPO | Admitting: Primary Care

## 2021-10-10 ENCOUNTER — Encounter: Payer: Self-pay | Admitting: Primary Care

## 2021-10-10 VITALS — BP 120/74 | HR 73 | Temp 98.1°F | Ht 71.0 in | Wt 203.6 lb

## 2021-10-10 DIAGNOSIS — R0683 Snoring: Secondary | ICD-10-CM

## 2021-10-10 DIAGNOSIS — G4739 Other sleep apnea: Secondary | ICD-10-CM

## 2021-10-10 DIAGNOSIS — G4731 Primary central sleep apnea: Secondary | ICD-10-CM

## 2021-10-10 NOTE — Patient Instructions (Addendum)
Recommendations Continue to wear CPAP every night for minimum 4 to 6 hours Do not drive experiencing excessive daytime sleepiness fatigue  Orders New CPAP machine - auto titrate 5 to 15 cm H2O with mask of choice (sleep study 6/1/212, snoring)  Follow-up 1 year with Beth NP   CPAP and BIPAP Information CPAP and BIPAP are methods that use air pressure to keep your airways open and to help you breathe well. CPAP and BIPAP use different amounts of pressure. Your health care provider will tell you whether CPAP or BIPAP would be more helpful for you. CPAP stands for "continuous positive airway pressure." With CPAP, the amount of pressure stays the same while you breathe in (inhale) and out (exhale). BIPAP stands for "bi-level positive airway pressure." With BIPAP, the amount of pressure will be higher when you inhale and lower when you exhale. This allows you to take larger breaths. CPAP or BIPAP may be used in the hospital, or your health care provider may want you to use it at home. You may need to have a sleep study before your health care provider can order a machine for you to use at home. What are the advantages? CPAP or BIPAP can be helpful if you have: Sleep apnea. Chronic obstructive pulmonary disease (COPD). Heart failure. Medical conditions that cause muscle weakness, including muscular dystrophy or amyotrophic lateral sclerosis (ALS). Other problems that cause breathing to be shallow, weak, abnormal, or difficult. CPAP and BIPAP are most commonly used for obstructive sleep apnea (OSA) to keep the airways from collapsing when the muscles relax during sleep. What are the risks? Generally, this is a safe treatment. However, problems may occur, including: Irritated skin or skin sores if the mask does not fit properly. Dry or stuffy nose or nosebleeds. Dry mouth. Feeling gassy or bloated. Sinus or lung infection if the equipment is not cleaned properly. When should CPAP or BIPAP be  used? In most cases, the mask only needs to be worn during sleep. Generally, the mask needs to be worn throughout the night and during any daytime naps. People with certain medical conditions may also need to wear the mask at other times, such as when they are awake. Follow instructions from your health care provider about when to use the machine. What happens during CPAP or BIPAP?  Both CPAP and BIPAP are provided by a small machine with a flexible plastic tube that attaches to a plastic mask that you wear. Air is blown through the mask into your nose or mouth. The amount of pressure that is used to blow the air can be adjusted on the machine. Your health care provider will set the pressure setting and help you find the best mask for you. Tips for using the mask Because the mask needs to be snug, some people feel trapped or closed-in (claustrophobic) when first using the mask. If you feel this way, you may need to get used to the mask. One way to do this is to hold the mask loosely over your nose or mouth and then gradually apply the mask more snugly. You can also gradually increase the amount of time that you use the mask. Masks are available in various types and sizes. If your mask does not fit well, talk with your health care provider about getting a different one. Some common types of masks include: Full face masks, which fit over the mouth and nose. Nasal masks, which fit over the nose. Nasal pillow or prong masks, which fit  into the nostrils. If you are using a mask that fits over your nose and you tend to breathe through your mouth, a chin strap may be applied to help keep your mouth closed. Use a skin barrier to protect your skin as told by your health care provider. Some CPAP and BIPAP machines have alarms that may sound if the mask comes off or develops a leak. If you have trouble with the mask, it is very important that you talk with your health care provider about finding a way to make the  mask easier to tolerate. Do not stop using the mask. There could be a negative impact on your health if you stop using the mask. Tips for using the machine Place your CPAP or BIPAP machine on a secure table or stand near an electrical outlet. Know where the on/off switch is on the machine. Follow instructions from your health care provider about how to set the pressure on your machine and when you should use it. Do not eat or drink while the CPAP or BIPAP machine is on. Food or fluids could get pushed into your lungs by the pressure of the CPAP or BIPAP. For home use, CPAP and BIPAP machines can be rented or purchased through home health care companies. Many different brands of machines are available. Renting a machine before purchasing may help you find out which particular machine works well for you. Your health insurance company may also decide which machine you may get. Keep the CPAP or BIPAP machine and attachments clean. Ask your health care provider for specific instructions. Check the humidifier if you have a dry stuffy nose or nosebleeds. Make sure it is working correctly. Follow these instructions at home: Take over-the-counter and prescription medicines only as told by your health care provider. Ask if you can take sinus medicine if your sinuses are blocked. Do not use any products that contain nicotine or tobacco. These products include cigarettes, chewing tobacco, and vaping devices, such as e-cigarettes. If you need help quitting, ask your health care provider. Keep all follow-up visits. This is important. Contact a health care provider if: You have redness or pressure sores on your head, face, mouth, or nose from the mask or head gear. You have trouble using the CPAP or BIPAP machine. You cannot tolerate wearing the CPAP or BIPAP mask. Someone tells you that you snore even when wearing your CPAP or BIPAP. Get help right away if: You have trouble breathing. You feel  confused. Summary CPAP and BIPAP are methods that use air pressure to keep your airways open and to help you breathe well. If you have trouble with the mask, it is very important that you talk with your health care provider about finding a way to make the mask easier to tolerate. Do not stop using the mask. There could be a negative impact to your health if you stop using the mask. Follow instructions from your health care provider about when to use the machine. This information is not intended to replace advice given to you by your health care provider. Make sure you discuss any questions you have with your health care provider. Document Revised: 10/12/2020 Document Reviewed: 02/12/2020 Elsevier Patient Education  [place].

## 2021-10-10 NOTE — Assessment & Plan Note (Signed)
-   NPSG 08/2010 showed moderate obstructive sleep apnea with AHI 24/hr. Currently well controlled on CPAP. No daytime sleepiness. He is 100% compliant with CPAP use in the last 30 days. Average usage 7 hours 66mn. Current pressure setting 5 - 14 cm H2O (8.5cm h20); residual AHI 3.1/hr.  We will place an order for patient to receive new CPAP auto 5 to 15 cm H2O. Encourage patient continue to wear CPAP every night for minimum 4 to 6 hours. Advised against driving if experiencing excessive daytime sleepiness.  Follow-up in 1 year or sooner if needed.

## 2021-10-10 NOTE — Progress Notes (Signed)
$'@Patient'K$  ID: Scott White, male    DOB: 01/14/1961, 61 y.o.   MRN: [id number]  Chief Complaint  Patient presents with   Consult    Currently C-Pap    Referring provider: [name], MD  HPI: 61 year old male, never smoked.  Past medical history significant for OSA, afib, CAD, HTN, dyslipidemia. Former Dr. Gwenette Greet patient.   10/10/2021 Patient presents today for sleep consult. He had sleep study on 08/18/10 which showed evidence of moderate OSA, AHI 24/hr. He is currently well controlled on CPAP and compliant with use.  Typical bedtime is 10:30 PM.  It takes him on average less than 20 minutes to fall asleep.  He wakes up several times a night.  Occasional palpitations at night. He starts his day at 5:15 in the morning.  His weight is down 10 pounds.  He needs new CPAP machine. No issues with mask fit or pressute setting. He uses nasal pillow mask. DME company is [employer].  No symptoms of narcolepsy, cataplexy or sleepwalking.  Epworth score 2.   Airview download 07/12/2021 - 10/09/2021 Usage 90/90 days (100%); 87 days (97%) > 4 hours Average usage 7 hours 55 minutes Pressure 5 to 14 cm H2O (8.5 cm H2O- 95%) Air leaks 19.6L/min AHI 3.1   Sleep questionnaire  Prior sleep study- 08/18/10 Bedtime- 10:30pm Time to fall asleep- 20 min Nocturnal awakenings- 4 times Start day- 5:15am  Weight changes- down 10 lbs Cpap- yes, 5-14cm h20 Oxygen use- no Epworth - 2  No Known Allergies  Immunization History  Administered Date(s) Administered   Influenza-Unspecified 11/17/2012   Janssen (J&J) SARS-COV-2 Vaccination 06/11/2019   Pneumococcal Polysaccharide-23 03/25/2017   Tdap 11/16/2013   Zoster Recombinat (Shingrix) 02/09/2019, 07/18/2019    Past Medical History:  Diagnosis Date   Atrial fibrillation ([place])    a. Persistent, s/p afib ablation by [facility] 08/16/10;  b. s/p failed DCCV 05/2011; c. Chronic Tikosyn & Pradaxa (CHA2DS2VASc = 3-4).   CAD (coronary artery disease)    a.  07/2002 Inf MI s/p BMS -->RCA, otw nonobs dzs;  b. 12/2011 Neg MV.   Cancer ([place])    Phreesia 05/09/2020   CHF (congestive heart failure) ([place])    Phreesia 05/09/2020   Chronic diastolic CHF (congestive heart failure) ([facility])    Diabetes ([place])    Diabetes mellitus without complication ([place])    Phreesia 05/09/2020   HTN (hypertension)    Hyperlipidemia    Hypertension    Phreesia 05/09/2020   Myocardial infarction ([place])    Phreesia 05/09/2020   PVC's (premature ventricular contractions)    Sleep apnea    followed by Dr Gwenette Greet    Tobacco History: Social History   Tobacco Use  Smoking Status Never  Smokeless Tobacco Never   Counseling given: Not Answered   Outpatient Medications Prior to Visit  Medication Sig Dispense Refill   apixaban (ELIQUIS) 5 MG TABS tablet Take 1 tablet (5 mg total) by mouth 2 (two) times daily. 180 tablet 1   Boron 3 MG CAPS Take 3 mg by mouth daily.      dofetilide (TIKOSYN) 500 MCG capsule Take 1 capsule (500 mcg total) by mouth 2 (two) times daily. 180 capsule 0   empagliflozin (JARDIANCE) 10 MG TABS tablet Take 1 tablet (10 mg total) by mouth daily before breakfast. 90 tablet 1   ezetimibe (ZETIA) 10 MG tablet TAKE 1 TABLET DAILY 90 tablet 3   Ginger, Zingiber officinalis, (GINGER PO) Take 550 mg by mouth 4 (four)  times a week.     Ginkgo Biloba 120 MG TABS Take 120 mg by mouth 2 (two) times daily.     GINSENG PO Take 150 mg by mouth 2 (two) times daily.     glucose blood test strip Use as instructed - 1-2 per day. 70 each 3   HAWTHORN BERRY PO Take 1,500 mg by mouth 2 (two) times daily.     lisinopril (ZESTRIL) 20 MG tablet TAKE 1 TABLET BY MOUTH EVERY DAY IN THE EVENING 90 tablet 3   Magnesium 100 MG TABS Take 400 mg by mouth 2 (two) times daily.     metFORMIN (GLUCOPHAGE) 1000 MG tablet TAKE 1 TABLET (1000 MG TOTAL) BY MOUTH 2 (TWO) TIMES DAILY WITH A MEAL. 180 tablet 1   Milk Thistle 300 MG CAPS Take 300 mg by mouth 2 (two) times daily.      Multiple Vitamins-Minerals (CENTRUM PO) Take 1 tablet by mouth daily.     nitroGLYCERIN (NITROSTAT) 0.4 MG SL tablet Place 1 tablet (0.4 mg total) under the tongue every 5 (five) minutes as needed. For chest pain at 3rd dose call 911 25 tablet 4   Omega-3 Fatty Acids (FISH OIL PO) Take 1,400 mg by mouth 2 (two) times daily.     RHODIOLA ROSEA PO Take 500 mg by mouth 2 (two) times daily.     rosuvastatin (CRESTOR) 40 MG tablet TAKE 1 TABLET DAILY 90 tablet 3   tadalafil (CIALIS) 10 MG tablet TAKE 1 TABLET DAILY AS NEEDED FOR ERECTILE DYSFUNCTION. DO NOT USE IF TAKING NITROGLYCERIN 24 tablet 5   No facility-administered medications prior to visit.      Review of Systems  Review of Systems  Constitutional: Negative.  Negative for activity change.  HENT: Negative.    Respiratory: Negative.    Cardiovascular: Negative.      Physical Exam  BP 120/74 (BP Location: Right Arm, Patient Position: Sitting, Cuff Size: Normal)   Pulse 73   Temp 98.1 F (36.7 C) (Oral)   Ht '5\' 11"'$  (1.803 m)   Wt 203 lb 9.6 oz (92.4 kg)   SpO2 97%   BMI 28.40 kg/m  Physical Exam Constitutional:      Appearance: Normal appearance.  HENT:     Head: Normocephalic and atraumatic.  Cardiovascular:     Rate and Rhythm: Normal rate and regular rhythm.  Pulmonary:     Effort: Pulmonary effort is normal.     Breath sounds: Normal breath sounds.  Neurological:     Mental Status: He is alert.      Lab Results:  CBC    Component Value Date/Time   WBC 7.1 11/15/2020 0911   WBC 5.6 03/16/2013 0821   RBC 5.47 11/15/2020 0911   RBC 5.16 03/16/2013 0821   HGB 16.1 11/15/2020 0911   HCT 47.9 11/15/2020 0911   PLT 190 11/15/2020 0911   MCV 88 11/15/2020 0911   MCH 29.4 11/15/2020 0911   MCH 29.3 03/16/2013 0821   MCHC 33.6 11/15/2020 0911   MCHC 34.9 03/16/2013 0821   RDW 13.1 11/15/2020 0911   LYMPHSABS 2.2 11/15/2020 0911   MONOABS 0.5 01/14/2012 0855   EOSABS 0.1 11/15/2020 0911   BASOSABS 0.1  11/15/2020 0911    BMET    Component Value Date/Time   NA 138 05/29/2021 0932   NA 137 11/15/2020 0911   K 4.4 05/29/2021 0932   CL 102 05/29/2021 0932   CO2 26 05/29/2021 0932   GLUCOSE 112 (  H) 05/29/2021 0932   BUN 11 05/29/2021 0932   BUN 14 11/15/2020 0911   CREATININE 0.86 05/29/2021 0932   CREATININE 0.92 05/15/2016 0833   CALCIUM 9.1 05/29/2021 0932   GFRNONAA 93 05/11/2020 0858   GFRNONAA >89 11/17/2015 0828   GFRAA 108 05/11/2020 0858   GFRAA >89 11/17/2015 0828    BNP No results found for: "BNP"  ProBNP No results found for: "PROBNP"  Imaging: No results found.   Assessment & Plan:   Complex sleep apnea syndrome - NPSG 08/2010 showed moderate obstructive sleep apnea with AHI 24/hr. Currently well controlled on CPAP. No daytime sleepiness. He is 100% compliant with CPAP use in the last 30 days. Average usage 7 hours 26mn. Current pressure setting 5 - 14 cm H2O (8.5cm h20); residual AHI 3.1/hr.  We will place an order for patient to receive new CPAP auto 5 to 15 cm H2O. Encourage patient continue to wear CPAP every night for minimum 4 to 6 hours. Advised against driving if experiencing excessive daytime sleepiness.  Follow-up in 1 year or sooner if needed.     EMartyn Ehrich NP 10/10/2021

## 2021-10-14 DIAGNOSIS — G4733 Obstructive sleep apnea (adult) (pediatric): Secondary | ICD-10-CM | POA: Diagnosis not present

## 2021-10-18 ENCOUNTER — Ambulatory Visit: Payer: BC Managed Care – PPO | Admitting: Cardiology

## 2021-10-19 NOTE — Progress Notes (Signed)
Reviewed and agree with assessment/plan.   Siya Flurry, MD [facility] 10/19/2021, 9:58 AM Pager:  [phone]  

## 2021-11-02 ENCOUNTER — Other Ambulatory Visit: Payer: Self-pay | Admitting: Family Medicine

## 2021-11-02 DIAGNOSIS — E1165 Type 2 diabetes mellitus with hyperglycemia: Secondary | ICD-10-CM

## 2021-11-02 DIAGNOSIS — G4733 Obstructive sleep apnea (adult) (pediatric): Secondary | ICD-10-CM | POA: Diagnosis not present

## 2021-11-15 ENCOUNTER — Ambulatory Visit: Payer: BC Managed Care – PPO | Attending: Cardiology | Admitting: Nurse Practitioner

## 2021-11-15 ENCOUNTER — Other Ambulatory Visit: Payer: Self-pay | Admitting: Nurse Practitioner

## 2021-11-15 ENCOUNTER — Encounter: Payer: Self-pay | Admitting: Nurse Practitioner

## 2021-11-15 VITALS — BP 128/82 | HR 72 | Ht 71.0 in | Wt 202.2 lb

## 2021-11-15 DIAGNOSIS — I4819 Other persistent atrial fibrillation: Secondary | ICD-10-CM | POA: Diagnosis not present

## 2021-11-15 DIAGNOSIS — G4733 Obstructive sleep apnea (adult) (pediatric): Secondary | ICD-10-CM

## 2021-11-15 DIAGNOSIS — I5032 Chronic diastolic (congestive) heart failure: Secondary | ICD-10-CM | POA: Diagnosis not present

## 2021-11-15 DIAGNOSIS — E785 Hyperlipidemia, unspecified: Secondary | ICD-10-CM

## 2021-11-15 DIAGNOSIS — I2510 Atherosclerotic heart disease of native coronary artery without angina pectoris: Secondary | ICD-10-CM

## 2021-11-15 DIAGNOSIS — I10 Essential (primary) hypertension: Secondary | ICD-10-CM

## 2021-11-15 NOTE — Progress Notes (Addendum)
Office Visit    Patient Name: Scott White Date of Encounter: 11/15/2021  Primary Care Provider:  [name], MD Primary Cardiologist  Kirk Ruths, MD  Chief Complaint    61 year old male with a history of persistent atrial fibrillation, CAD s/p PCI-RCA, chronic diastolic heart failure, hypertension, hyperlipidemia, and OSA who presents for follow-up related to CAD and atrial fibrillation.   Past Medical History    Past Medical History:  Diagnosis Date   Atrial fibrillation ([place])    a. Persistent, s/p afib ablation by [facility] 08/16/10;  b. s/p failed DCCV 05/2011; c. Chronic Tikosyn & Pradaxa (CHA2DS2VASc = 3-4).   CAD (coronary artery disease)    a. 07/2002 Inf MI s/p BMS -->RCA, otw nonobs dzs;  b. 12/2011 Neg MV.   Cancer ([place])    Phreesia 05/09/2020   CHF (congestive heart failure) ([place])    Phreesia 05/09/2020   Chronic diastolic CHF (congestive heart failure) (HCC)    Diabetes ([place])    Diabetes mellitus without complication ([place])    Phreesia 05/09/2020   HTN (hypertension)    Hyperlipidemia    Hypertension    Phreesia 05/09/2020   Myocardial infarction ([place])    Phreesia 05/09/2020   PVC's (premature ventricular contractions)    Sleep apnea    followed by Dr Gwenette Greet   Past Surgical History:  Procedure Laterality Date   atrial ablatio  08/16/10   afib ablation by [facility] N/A 11/29/2020   Procedure: ATRIAL FIBRILLATION ABLATION;  Surgeon: Thompson Grayer, MD;  Location: [facility];  Service: Cardiovascular;  Laterality: N/A;   CARDIAC CATHETERIZATION  2004    Successful PTCA and stent placement in the mid right   coronary artery with an extra 99% narrowing to 0% with placement of a 3.5 x  20 mm Express 2 stent with improvement of TIMI grade 1 flow to TIMI grade 3.   CARDIOVERSION  06/15/2011   Procedure: CARDIOVERSION;  Surgeon: Fay Records, MD;  Location: [facility];  Service: Cardiovascular;  Laterality: N/A;   HERNIA REPAIR N/A     Phreesia 05/09/2020   tonsilectomy      Allergies  No Known Allergies  History of Present Illness    61 year old male with the above past medical history including persistent atrial fibrillation, CAD s/p PCI-RCA, chronic diastolic heart failure, hypertension, hyperlipidemia, and OSA.  He has a history of CAD s/p MI, PTCA-RCA in 2004.  No obstructive disease in the left system.  He has a history of atrial fibrillation and has undergone previous ablation.  Nuclear study in October 2013 showed EF 50%, no ischemia.  Echocardiogram in January 2021 showed normal LV function.  He is followed in the A-fib clinic and is on Tikosyn and Eliquis.  Repeat A-fib ablation in 11/2020.  He was last seen in the A-fib clinic on 06/21/2021 and was stable from a cardiac standpoint.  Containing sinus rhythm at the time.  QTc was stable.  He was interested in discussing discontinuation of Tikosyn with Dr. Stanford Breed.  He presents today for follow-up.  Since his last visit has done well from a cardiac standpoint.  He denies any palpitations, chest pain, dyspnea, edema, PND, orthopnea.  He believes he has been maintaining sinus rhythm since the time of his ablation.  He is interested in seeing if he can discontinue his Tikosyn.  He denies any bleeding on Eliquis.  Overall, he reports feeling well and denies any additional concerns today.  Home Medications     Current Outpatient Medications  Medication Sig Dispense Refill   apixaban (ELIQUIS) 5 MG TABS tablet Take 1 tablet (5 mg total) by mouth 2 (two) times daily. 180 tablet 1   dofetilide (TIKOSYN) 500 MCG capsule Take 1 capsule (500 mcg total) by mouth 2 (two) times daily. 180 capsule 0   ezetimibe (ZETIA) 10 MG tablet TAKE 1 TABLET DAILY 90 tablet 3   glucose blood test strip Use as instructed - 1-2 per day. 70 each 3   JARDIANCE 10 MG TABS tablet TAKE 1 TABLET DAILY BEFORE BREAKFAST 90 tablet 3   lisinopril (ZESTRIL) 20 MG tablet TAKE 1 TABLET BY MOUTH EVERY DAY  IN THE EVENING 90 tablet 3   Magnesium 100 MG TABS Take 400 mg by mouth 2 (two) times daily.     metFORMIN (GLUCOPHAGE) 1000 MG tablet TAKE 1 TABLET (1000 MG TOTAL) BY MOUTH 2 (TWO) TIMES DAILY WITH A MEAL. 180 tablet 1   Multiple Vitamins-Minerals (CENTRUM PO) Take 1 tablet by mouth daily.     nitroGLYCERIN (NITROSTAT) 0.4 MG SL tablet Place 1 tablet (0.4 mg total) under the tongue every 5 (five) minutes as needed. For chest pain at 3rd dose call 911 25 tablet 4   Omega-3 Fatty Acids (FISH OIL PO) Take 1,400 mg by mouth 2 (two) times daily.     rosuvastatin (CRESTOR) 40 MG tablet TAKE 1 TABLET DAILY 90 tablet 3   tadalafil (CIALIS) 10 MG tablet TAKE 1 TABLET DAILY AS NEEDED FOR ERECTILE DYSFUNCTION. DO NOT USE IF TAKING NITROGLYCERIN 24 tablet 5   No current facility-administered medications for this visit.     Review of Systems    He denies chest pain, palpitations, dyspnea, pnd, orthopnea, n, v, dizziness, syncope, edema, weight gain, or early satiety. All other systems reviewed and are otherwise negative except as noted above.   Physical Exam    VS:  BP 128/82   Pulse 72   Ht '5\' 11"'$  (1.803 m)   Wt 202 lb 3.2 oz (91.7 kg)   SpO2 97%   BMI 28.20 kg/m   GEN: Well nourished, well developed, in no acute distress. HEENT: normal. Neck: Supple, no JVD, carotid bruits, or masses. Cardiac: RRR, no murmurs, rubs, or gallops. No clubbing, cyanosis, edema.  Radials/DP/PT 2+ and equal bilaterally.  Respiratory:  Respirations regular and unlabored, clear to auscultation bilaterally. GI: Soft, nontender, nondistended, BS + x 4. MS: no deformity or atrophy. Skin: warm and dry, no rash. Neuro:  Strength and sensation are intact. Psych: Normal affect.  Accessory Clinical Findings    ECG personally reviewed by me today - No EKG in office today.   Lab Results  Component Value Date   WBC 7.1 11/15/2020   HGB 16.1 11/15/2020   HCT 47.9 11/15/2020   MCV 88 11/15/2020   PLT 190 11/15/2020    Lab Results  Component Value Date   CREATININE 0.86 05/29/2021   BUN 11 05/29/2021   NA 138 05/29/2021   K 4.4 05/29/2021   CL 102 05/29/2021   CO2 26 05/29/2021   Lab Results  Component Value Date   ALT 20 05/29/2021   AST 19 05/29/2021   ALKPHOS 46 05/29/2021   BILITOT 0.6 05/29/2021   Lab Results  Component Value Date   CHOL 116 05/29/2021   HDL 33.60 (L) 05/29/2021   LDLCALC 46 05/29/2021   LDLDIRECT 65.0 11/09/2020   TRIG 185.0 (H) 05/29/2021   CHOLHDL 3 05/29/2021  Lab Results  Component Value Date   HGBA1C 6.5 (A) 05/29/2021    Assessment & Plan    1. Persistent atrial fibrillation: S/p ablation x2, most recently in 11/2020.  Appears to be maintaining NSR.  He has been off his beta-blocker for quite some time.  He is asking if it is possible to discontinue Tikosyn.  We will discuss this with Dr. Stanford Breed.  For now, continue Eliquis, Tikosyn.  2. CAD: S/p PCI-RCA in 2004. Stable with no anginal symptoms. No indication for ischemic evaluation.  Continue lisinopril, Crestor, Zetia.  3. Chronic diastolic heart failure:   Echo in January 2021 showed normal LV function. Euvolemic and well compensated on exam.  Continue lisinopril, Jardiance.  4. Hypertension: BP well controlled. Continue current antihypertensive regimen.   5. Hyperlipidemia: LDL was 46 in 05/2021.  Monitored and managed per PCP. Continue Crestor, Zetia.  6. OSA: Adherent to CPAP.   7. Disposition: Follow-up in 4-6 months.   Addendum 11/15/2021: Discussed discontinuation of Tikosyn with Dr. Stanford Breed. Per Dr. Stanford Breed, "Okay to [place] and follow.  He should know that likelihood of atrial fibrillation could be increased but hopefully previous ablation will help him maintain sinus rhythm."  Patient notified, he verbalized understanding and states he will plan to discontinue Tikosyn on 11/17/2021.     Lenna Sciara, NP 11/15/2021, 4:56 PM

## 2021-11-15 NOTE — Patient Instructions (Signed)
Medication Instructions:  Your physician recommends that you continue on your current medications as directed. Please refer to the Current Medication list given to you today.   *If you need a refill on your cardiac medications before your next appointment, please call your pharmacy*   Lab Work: NONE ordered at this time of appointment   If you have labs (blood work) drawn today and your tests are completely normal, you will receive your results only by: MyChart Message (if you have MyChart) OR A paper copy in the mail If you have any lab test that is abnormal or we need to change your treatment, we will call you to review the results.   Testing/Procedures: NONE ordered at this time of appointment     Follow-Up: At [facility], you and your health needs are our priority.  As part of our continuing mission to provide you with exceptional heart care, we have created designated Provider Care Teams.  These Care Teams include your primary Cardiologist (physician) and Advanced Practice Providers (APPs -  Physician Assistants and Nurse Practitioners) who all work together to provide you with the care you need, when you need it.  We recommend signing up for the patient portal called "MyChart".  Sign up information is provided on this After Visit Summary.  MyChart is used to connect with patients for Virtual Visits (Telemedicine).  Patients are able to view lab/test results, encounter notes, upcoming appointments, etc.  Non-urgent messages can be sent to your provider as well.   To learn more about what you can do with MyChart, go to [email or web address].    Your next appointment:   4-6 month(s)  The format for your next appointment:   In Person  Provider:   Brian Crenshaw, MD     Other Instructions   Important Information About Sugar       

## 2021-11-28 ENCOUNTER — Other Ambulatory Visit: Payer: Self-pay | Admitting: Cardiology

## 2021-11-29 NOTE — Telephone Encounter (Signed)
Prescription refill request for Eliquis received. Indication: Afib  Last office visit: 11/15/21 ([place]) Scr: 0.86 (05/29/21) Age: 61 Weight: 91.7kg  Appropriate dose and refill sent to requested pharmacy.

## 2021-11-30 ENCOUNTER — Ambulatory Visit: Payer: BC Managed Care – PPO | Admitting: Family Medicine

## 2021-11-30 ENCOUNTER — Encounter: Payer: Self-pay | Admitting: Family Medicine

## 2021-11-30 VITALS — BP 122/62 | HR 77 | Temp 98.1°F | Ht 71.0 in | Wt 196.2 lb

## 2021-11-30 DIAGNOSIS — E1165 Type 2 diabetes mellitus with hyperglycemia: Secondary | ICD-10-CM

## 2021-11-30 DIAGNOSIS — I10 Essential (primary) hypertension: Secondary | ICD-10-CM

## 2021-11-30 DIAGNOSIS — E785 Hyperlipidemia, unspecified: Secondary | ICD-10-CM

## 2021-11-30 DIAGNOSIS — I4891 Unspecified atrial fibrillation: Secondary | ICD-10-CM | POA: Diagnosis not present

## 2021-11-30 LAB — COMPREHENSIVE METABOLIC PANEL
ALT: 32 U/L (ref 0–53)
AST: 25 U/L (ref 0–37)
Albumin: 4.5 g/dL (ref 3.5–5.2)
Alkaline Phosphatase: 49 U/L (ref 39–117)
BUN: 12 mg/dL (ref 6–23)
CO2: 30 mEq/L (ref 19–32)
Calcium: 9.7 mg/dL (ref 8.4–10.5)
Chloride: 102 mEq/L (ref 96–112)
Creatinine, Ser: 0.99 mg/dL (ref 0.40–1.50)
GFR: 82.29 mL/min (ref >60.00)
Glucose, Bld: 124 mg/dL — ABNORMAL HIGH (ref 70–99)
Potassium: 5 mEq/L (ref 3.5–5.1)
Sodium: 139 mEq/L (ref 135–145)
Total Bilirubin: 0.5 mg/dL (ref 0.2–1.2)
Total Protein: 7.4 g/dL (ref 6.0–8.3)

## 2021-11-30 LAB — LIPID PANEL
Cholesterol: 109 mg/dL (ref 0–200)
HDL: 32.5 mg/dL — ABNORMAL LOW (ref >39.00)
LDL Cholesterol: 42 mg/dL (ref 0–99)
NonHDL: 76.64
Total CHOL/HDL Ratio: 3
Triglycerides: 173 mg/dL — ABNORMAL HIGH (ref 0.0–149.0)
VLDL: 34.6 mg/dL (ref 0.0–40.0)

## 2021-11-30 LAB — HEMOGLOBIN A1C: Hgb A1c MFr Bld: 7.6 % — ABNORMAL HIGH (ref 4.6–6.5)

## 2021-11-30 LAB — MICROALBUMIN / CREATININE URINE RATIO
Creatinine,U: 48.9 mg/dL
Microalb Creat Ratio: 1.4 mg/g (ref 0.0–30.0)
Microalb, Ur: <0.7 mg/dL (ref 0.0–1.9)

## 2021-11-30 MED ORDER — METFORMIN HCL 1000 MG PO TABS: ORAL_TABLET | ORAL | 1 refills | Status: DC

## 2021-11-30 NOTE — Progress Notes (Signed)
Subjective:  Patient ID: Scott White, male    DOB: [birth year]  Age: 61 y.o. MRN: [id number]  CC:  Chief Complaint  Patient presents with   Diabetes    Pt states all is well    HPI Malachy Chamber [place] presents for   Diabetes: Complicated by hyperglycemia, CAD Metformin 1000 mg twice daily, Jardiance 10 mg daily.  He is on ACE inhibitor, statin, Zetia. No genital rash or uti sx's.  Home readings fasting 151 Home readings postprandial higher 100's. Close to 200.  Diet has been stable., some higher readings on Saturday with diet changes on that day only. Some decreased exercise - room for improvement.  No symptomatic lows, lowest 80.  Microalbumin: Normal ratio 10/2020 Optho, foot exam, pneumovax: up to date.   Lab Results  Component Value Date   HGBA1C 6.5 (A) 05/29/2021   HGBA1C 7.5 (H) 02/24/2021   HGBA1C 7.9 (H) 11/09/2020   Lab Results  Component Value Date   MICROALBUR <0.7 11/09/2020   LDLCALC 46 05/29/2021   CREATININE 0.86 05/29/2021   Hypertension: With CAD, A-fib, OSA with CPAP adherence.- new device.cardiology note reviewed.  Chronic diastolic heart failure but echo in January 2021 with normal LV function.  Followed by cardiology and electrophysiology.  Status post ablation in September 2022.  Anticoag with Eliquis, rate control with Toprol previously, decreased dose then tapered off.  Has taken Tikosyn previously, discontinued after most recent visit August 30 with cardiology.  Discussed possible recurrence of A-fib off that medication.  Lisinopril for hypertension. No recurrence of afib known. Occasional brief palpitation - similar in past. No new bleeding.  Home readings: 130/80.  BP Readings from Last 3 Encounters:  11/30/21 122/62  11/15/21 128/82  10/10/21 120/74   Lab Results  Component Value Date   CREATININE 0.86 05/29/2021   Hyperlipidemia: Crestor 40 mg daily, Zetia 10 mg daily. No new myalgias/side effects.  Lab Results  Component Value  Date   CHOL 116 05/29/2021   HDL 33.60 (L) 05/29/2021   LDLCALC 46 05/29/2021   LDLDIRECT 65.0 11/09/2020   TRIG 185.0 (H) 05/29/2021   CHOLHDL 3 05/29/2021   Lab Results  Component Value Date   ALT 20 05/29/2021   AST 19 05/29/2021   ALKPHOS 46 05/29/2021   BILITOT 0.6 05/29/2021     History Patient Active Problem List   Diagnosis Date Noted   Complex sleep apnea syndrome 09/22/2010   Long term (current) use of anticoagulants 07/03/2010   Long term current use of anticoagulant 05/31/2010   DYSLIPIDEMIA 09/27/2008   HYPERTRIGLYCERIDEMIA 02/11/2008   ESSENTIAL HYPERTENSION, BENIGN 02/11/2008   CORONARY ATHEROSCLEROSIS NATIVE CORONARY ARTERY 02/11/2008   ATRIAL FIBRILLATION 02/11/2008   Past Medical History:  Diagnosis Date   Atrial fibrillation ([place])    a. Persistent, s/p afib ablation by [facility] 08/16/10;  b. s/p failed DCCV 05/2011; c. Chronic Tikosyn & Pradaxa (CHA2DS2VASc = 3-4).   CAD (coronary artery disease)    a. 07/2002 Inf MI s/p BMS -->RCA, otw nonobs dzs;  b. 12/2011 Neg MV.   Cancer ([place])    Phreesia 05/09/2020   CHF (congestive heart failure) ([place])    Phreesia 05/09/2020   Chronic diastolic CHF (congestive heart failure) ([place])    Diabetes ([place])    Diabetes mellitus without complication ([place])    Phreesia 05/09/2020   HTN (hypertension)    Hyperlipidemia    Hypertension    Phreesia 05/09/2020   Myocardial infarction [facility])    Phreesia 05/09/2020  PVC's (premature ventricular contractions)    Sleep apnea    followed by Dr Gwenette Greet   Past Surgical History:  Procedure Laterality Date   atrial ablatio  08/16/10   afib ablation by [facility] N/A 11/29/2020   Procedure: ATRIAL FIBRILLATION ABLATION;  Surgeon: Thompson Grayer, MD;  Location: [facility];  Service: Cardiovascular;  Laterality: N/A;   CARDIAC CATHETERIZATION  2004    Successful PTCA and stent placement in the mid right   coronary artery with an extra 99% narrowing to 0%  with placement of a 3.5 x  20 mm Express 2 stent with improvement of TIMI grade 1 flow to TIMI grade 3.   CARDIOVERSION  06/15/2011   Procedure: CARDIOVERSION;  Surgeon: Fay Records, MD;  Location: [facility];  Service: Cardiovascular;  Laterality: N/A;   HERNIA REPAIR N/A    Phreesia 05/09/2020   tonsilectomy     No Known Allergies Prior to Admission medications   Medication Sig Start Date End Date Taking? Authorizing Provider  ELIQUIS 5 MG TABS tablet TAKE 1 TABLET TWICE A DAY 11/29/21  Yes Lelon Perla, MD  ezetimibe (ZETIA) 10 MG tablet TAKE 1 TABLET DAILY 02/27/21  Yes Lelon Perla, MD  glucose blood test strip Use as instructed - 1-2 per day. 01/23/21  Yes [name], MD  JARDIANCE 10 MG TABS tablet TAKE 1 TABLET DAILY BEFORE BREAKFAST 11/02/21  Yes [name], MD  lisinopril (ZESTRIL) 20 MG tablet TAKE 1 TABLET BY MOUTH EVERY DAY IN THE EVENING 10/24/20  Yes Lelon Perla, MD  Magnesium 100 MG TABS Take 400 mg by mouth 2 (two) times daily.   Yes [provider]  metFORMIN (GLUCOPHAGE) 1000 MG tablet TAKE 1 TABLET (1000 MG TOTAL) BY MOUTH 2 (TWO) TIMES DAILY WITH A MEAL. 05/29/21  Yes [name], MD  Multiple Vitamins-Minerals (CENTRUM PO) Take 1 tablet by mouth daily.   Yes [provider]  nitroGLYCERIN (NITROSTAT) 0.4 MG SL tablet Place 1 tablet (0.4 mg total) under the tongue every 5 (five) minutes as needed. For chest pain at 3rd dose call 911 06/09/19  Yes Crenshaw, Denice Bors, MD  Omega-3 Fatty Acids (FISH OIL PO) Take 1,400 mg by mouth 2 (two) times daily.   Yes [provider]  rosuvastatin (CRESTOR) 40 MG tablet TAKE 1 TABLET DAILY 09/06/21  Yes [name], MD  tadalafil (CIALIS) 10 MG tablet TAKE 1 TABLET DAILY AS NEEDED FOR ERECTILE DYSFUNCTION. DO NOT USE IF TAKING NITROGLYCERIN 03/07/21  Yes [name], MD   Social History   Socioeconomic History   Marital status: Single    Spouse name: Not on file   Number of  children: 0   Years of education: Not on file   Highest education level: Not on file  Occupational History   Occupation: Teacher, English as a foreign language    Employer: [employer]  Tobacco Use   Smoking status: Never   Smokeless tobacco: Never  Vaping Use   Vaping Use: Never used  Substance and Sexual Activity   Alcohol use: Yes    Alcohol/week: 1.0 standard drink of alcohol    Types: 1 Cans of beer per week    Comment:  three times a week   Drug use: No   Sexual activity: Yes  Other Topics Concern   Not on file  Social History Narrative   Pt lives in [place] alone.  Single.   He  is a Teacher, English as a foreign language for [employer]   Social Determinants of Health   Financial Resource Strain: Not on file  Food Insecurity: Not on file  Transportation Needs: Not on file  Physical Activity: Not on file  Stress: Not on file  Social Connections: Not on file  Intimate Partner Violence: Not on file    Review of Systems  Constitutional:  Negative for fatigue and unexpected weight change.  Eyes:  Negative for visual disturbance.  Respiratory:  Negative for cough, chest tightness and shortness of breath.   Cardiovascular:  Negative for chest pain, palpitations and leg swelling.  Gastrointestinal:  Negative for abdominal pain and blood in stool.  Neurological:  Negative for dizziness, light-headedness and headaches.     Objective:   Vitals:   11/30/21 0806  BP: 122/62  Pulse: 77  Temp: 98.1 F (36.7 C)  SpO2: 98%  Weight: 196 lb 3.2 oz (89 kg)  Height: '5\' 11"'$  (1.803 m)     Physical Exam Vitals reviewed.  Constitutional:      Appearance: He is well-developed.  HENT:     Head: Normocephalic and atraumatic.  Neck:     Vascular: No carotid bruit or JVD.  Cardiovascular:     Rate and Rhythm: Normal rate and regular rhythm.     Heart sounds: Normal heart sounds. No murmur heard. Pulmonary:     Effort: Pulmonary effort is normal.     Breath sounds: Normal breath sounds. No  rales.  Musculoskeletal:     Right lower leg: No edema.     Left lower leg: No edema.  Skin:    General: Skin is warm and dry.  Neurological:     Mental Status: He is alert and oriented to person, place, and time.  Psychiatric:        Mood and Affect: Mood normal.        Assessment & Plan:  ARVAL BRANDSTETTER is a 61 y.o. male . Type 2 diabetes mellitus with hyperglycemia, without long-term current use of insulin (HCC) - Plan: Hemoglobin A1c, Microalbumin / creatinine urine ratio, metFORMIN (GLUCOPHAGE) 1000 MG tablet  -  Stable, tolerating current regimen. Medications refilled. Labs pending as above.  Some elevated postprandial readings, room for improvement on exercise.  Check A1c to determine medication changes versus improved exercise/diet changes.  71-monthfollow-up unless elevated A1c, then 3 months.  Hyperlipidemia, unspecified hyperlipidemia type - Plan: Lipid panel, Comprehensive metabolic panel  -Tolerating current regimen, has refills, updated labs ordered with medication adjustment accordingly  Essential hypertension - Plan: Comprehensive metabolic panel  -Stable, continue same regimen  Atrial fibrillation, Now off Tikosyn, continued on anticoagulation.  Denies recurrence of A-fib.  Regular rhythm on exam.  RTC precautions.  Meds ordered this encounter  Medications   metFORMIN (GLUCOPHAGE) 1000 MG tablet    Sig: TAKE 1 TABLET (1000 MG TOTAL) BY MOUTH 2 (TWO) TIMES DAILY WITH A MEAL.    Dispense:  180 tablet    Refill:  1   Patient Instructions  No medication changes at this time.  If A1c is elevated we will follow-up in 3 months, otherwise 672-monthollow-up is fine.  Thanks for coming today and let me know if there are questions.    Signed,   JeMerri RayMD [facility] 11/30/21 8:36 AM

## 2021-11-30 NOTE — Patient Instructions (Signed)
No medication changes at this time.  If A1c is elevated we will follow-up in 3 months, otherwise 57-monthfollow-up is fine.  Thanks for coming today and let me know if there are questions.

## 2021-12-03 DIAGNOSIS — G4733 Obstructive sleep apnea (adult) (pediatric): Secondary | ICD-10-CM | POA: Diagnosis not present

## 2022-01-02 DIAGNOSIS — G4733 Obstructive sleep apnea (adult) (pediatric): Secondary | ICD-10-CM | POA: Diagnosis not present

## 2022-01-04 ENCOUNTER — Other Ambulatory Visit: Payer: Self-pay | Admitting: Cardiology

## 2022-01-24 ENCOUNTER — Other Ambulatory Visit: Payer: Self-pay | Admitting: Family Medicine

## 2022-01-24 DIAGNOSIS — I10 Essential (primary) hypertension: Secondary | ICD-10-CM

## 2022-02-02 DIAGNOSIS — G4733 Obstructive sleep apnea (adult) (pediatric): Secondary | ICD-10-CM | POA: Diagnosis not present

## 2022-02-12 DIAGNOSIS — L821 Other seborrheic keratosis: Secondary | ICD-10-CM | POA: Diagnosis not present

## 2022-02-12 DIAGNOSIS — D1801 Hemangioma of skin and subcutaneous tissue: Secondary | ICD-10-CM | POA: Diagnosis not present

## 2022-02-12 DIAGNOSIS — L814 Other melanin hyperpigmentation: Secondary | ICD-10-CM | POA: Diagnosis not present

## 2022-02-12 DIAGNOSIS — Z08 Encounter for follow-up examination after completed treatment for malignant neoplasm: Secondary | ICD-10-CM | POA: Diagnosis not present

## 2022-02-12 DIAGNOSIS — L570 Actinic keratosis: Secondary | ICD-10-CM | POA: Diagnosis not present

## 2022-02-13 NOTE — Progress Notes (Signed)
HPI: FU atrial fibrillation, coronary disease, hypertension and hyperlipidemia. Patient has had previous PCI of his right coronary artery in the setting of an acute infarct in 2004. No obstructive disease in the left system. Nuclear study in October of 2013 showed an ejection fraction of 50%. There was no ischemia.   Echocardiogram January 2021 showed normal LV function. Cardiac CTA September 2022 showed calcium score 1956 which was 99th percentile and no left atrial appendage thrombus.  Patient had atrial fibrillation ablation November 29, 2020.  Since last seen, patient states that occasionally when he is doing his walks he will feel some tightness in his jaws with walking up the first he will but then this resolves and he can continue to walk with no symptoms.  He has no symptoms at rest.  He denies dyspnea, syncope or bleeding.  He occasionally feels his heart flutter consistent with his previous atrial fibrillation but it is short-lived.  Current Outpatient Medications  Medication Sig Dispense Refill   ELIQUIS 5 MG TABS tablet TAKE 1 TABLET TWICE A DAY 180 tablet 3   ezetimibe (ZETIA) 10 MG tablet TAKE 1 TABLET DAILY 90 tablet 3   glucose blood test strip Use as instructed - 1-2 per day. 70 each 3   JARDIANCE 10 MG TABS tablet TAKE 1 TABLET DAILY BEFORE BREAKFAST 90 tablet 3   lisinopril (ZESTRIL) 20 MG tablet TAKE 1 TABLET BY MOUTH EVERY DAY IN THE EVENING 90 tablet 3   Magnesium 100 MG TABS Take 400 mg by mouth 2 (two) times daily.     metFORMIN (GLUCOPHAGE) 1000 MG tablet TAKE 1 TABLET (1000 MG TOTAL) BY MOUTH 2 (TWO) TIMES DAILY WITH A MEAL. 180 tablet 1   Multiple Vitamins-Minerals (CENTRUM PO) Take 1 tablet by mouth daily.     nitroGLYCERIN (NITROSTAT) 0.4 MG SL tablet Place 1 tablet (0.4 mg total) under the tongue every 5 (five) minutes as needed. For chest pain at 3rd dose call 911 25 tablet 4   Omega-3 Fatty Acids (FISH OIL PO) Take 1,400 mg by mouth 2 (two) times daily.      rosuvastatin (CRESTOR) 40 MG tablet TAKE 1 TABLET DAILY 90 tablet 3   tadalafil (CIALIS) 10 MG tablet TAKE 1 TABLET DAILY AS NEEDED FOR ERECTILE DYSFUNCTION. DO NOT USE IF TAKING NITROGLYCERIN 24 tablet 5   No current facility-administered medications for this visit.     Past Medical History:  Diagnosis Date   Atrial fibrillation ([place])    a. Persistent, s/p afib ablation by [facility] 08/16/10;  b. s/p failed DCCV 05/2011; c. Chronic Tikosyn & Pradaxa (CHA2DS2VASc = 3-4).   CAD (coronary artery disease)    a. 07/2002 Inf MI s/p BMS -->RCA, otw nonobs dzs;  b. 12/2011 Neg MV.   Cancer ([place])    Phreesia 05/09/2020   CHF (congestive heart failure) ([place])    Phreesia 05/09/2020   Chronic diastolic CHF (congestive heart failure) (HCC)    Diabetes ([place])    Diabetes mellitus without complication ([place])    Phreesia 05/09/2020   HTN (hypertension)    Hyperlipidemia    Hypertension    Phreesia 05/09/2020   Myocardial infarction ([place])    Phreesia 05/09/2020   PVC's (premature ventricular contractions)    Sleep apnea    followed by Dr Gwenette Greet    Past Surgical History:  Procedure Laterality Date   atrial ablatio  08/16/10   afib ablation by [place] N/A 11/29/2020  Procedure: ATRIAL FIBRILLATION ABLATION;  Surgeon: Thompson Grayer, MD;  Location: [facility];  Service: Cardiovascular;  Laterality: N/A;   CARDIAC CATHETERIZATION  2004    Successful PTCA and stent placement in the mid right   coronary artery with an extra 99% narrowing to 0% with placement of a 3.5 x  20 mm Express 2 stent with improvement of TIMI grade 1 flow to TIMI grade 3.   CARDIOVERSION  06/15/2011   Procedure: CARDIOVERSION;  Surgeon: Fay Records, MD;  Location: [facility];  Service: Cardiovascular;  Laterality: N/A;   HERNIA REPAIR N/A    Phreesia 05/09/2020   tonsilectomy      Social History   Socioeconomic History   Marital status: Single    Spouse name: Not on file   Number of children: 0    Years of education: Not on file   Highest education level: Not on file  Occupational History   Occupation: Pharmacist, community: [employer]  Tobacco Use   Smoking status: Never   Smokeless tobacco: Never  Vaping Use   Vaping Use: Never used  Substance and Sexual Activity   Alcohol use: Yes    Alcohol/week: 1.0 standard drink of alcohol    Types: 1 Cans of beer per week    Comment:  three times a week   Drug use: No   Sexual activity: Yes  Other Topics Concern   Not on file  Social History Narrative   Pt lives in [place] alone.  Single.   He is a Teacher, English as a foreign language for [employer]   Social Determinants of Health   Financial Resource Strain: Not on file  Food Insecurity: Not on file  Transportation Needs: Not on file  Physical Activity: Not on file  Stress: Not on file  Social Connections: Not on file  Intimate Partner Violence: Not on file    Family History  Problem Relation Age of Onset   Coronary artery disease Mother    Heart failure Mother    Heart disease Mother    Hyperlipidemia Mother    Hypertension Mother    Alzheimer's disease Father    Diabetes Father    Diabetes Brother    Heart disease Brother    Hyperlipidemia Brother    Hypertension Brother    Alzheimer's disease Brother    Hyperlipidemia Brother    Colon cancer Neg Hx    Pancreatic cancer Neg Hx    Rectal cancer Neg Hx    Stomach cancer Neg Hx     ROS: no fevers or chills, productive cough, hemoptysis, dysphasia, odynophagia, melena, hematochezia, dysuria, hematuria, rash, seizure activity, orthopnea, PND, pedal edema, claudication. Remaining systems are negative.  Physical Exam: Well-developed well-nourished in no acute distress.  Skin is warm and dry.  HEENT is normal.  Neck is supple.  Chest is clear to auscultation with normal expansion.  Cardiovascular exam is regular rate and rhythm.  Abdominal exam nontender or distended. No masses palpated. Extremities  show no edema. neuro grossly intact  A/P  1 paroxysmal atrial fibrillation-patient is status post ablation.  He remains in sinus rhythm on exam.  Continue beta-blocker and apixaban.  Tikosyn discontinued previously at his request.  He is having occasional short episodes of atrial fibrillation by report.  If these become more frequent can consider referral for repeat ablation versus resuming Tikosyn.  2 coronary artery disease- Continue statin.  No aspirin given need for apixaban.  He occasionally has  some jaw tightness with walking up the first hill during exercise.  However this does not occur as he continues to walk.  We discussed the potential stress test today but for now we will plan to be conservative.  I have asked him to contact us if his symptoms worsen.  3 hypertension-blood pressure controlled.  Continue present medical regimen.  4 hyperlipidemia-continue Crestor and Zetia.  5 obstructive sleep apnea-continue CPAP.  Kirk Ruths, MD

## 2022-02-22 ENCOUNTER — Other Ambulatory Visit: Payer: Self-pay | Admitting: Cardiology

## 2022-02-22 DIAGNOSIS — E7800 Pure hypercholesterolemia, unspecified: Secondary | ICD-10-CM

## 2022-02-26 ENCOUNTER — Ambulatory Visit: Payer: BC Managed Care – PPO | Attending: Cardiology | Admitting: Cardiology

## 2022-02-26 ENCOUNTER — Encounter: Payer: Self-pay | Admitting: Cardiology

## 2022-02-26 VITALS — BP 136/78 | HR 77 | Ht 71.0 in | Wt 204.6 lb

## 2022-02-26 DIAGNOSIS — E785 Hyperlipidemia, unspecified: Secondary | ICD-10-CM

## 2022-02-26 DIAGNOSIS — I10 Essential (primary) hypertension: Secondary | ICD-10-CM

## 2022-02-26 DIAGNOSIS — I2510 Atherosclerotic heart disease of native coronary artery without angina pectoris: Secondary | ICD-10-CM | POA: Diagnosis not present

## 2022-02-26 DIAGNOSIS — I480 Paroxysmal atrial fibrillation: Secondary | ICD-10-CM

## 2022-02-26 MED ORDER — LISINOPRIL 20 MG PO TABS: ORAL_TABLET | ORAL | 3 refills | Status: DC

## 2022-02-26 NOTE — Patient Instructions (Signed)
  Follow-Up: At [facility], you and your health needs are our priority.  As part of our continuing mission to provide you with exceptional heart care, we have created designated Provider Care Teams.  These Care Teams include your primary Cardiologist (physician) and Advanced Practice Providers (APPs -  Physician Assistants and Nurse Practitioners) who all work together to provide you with the care you need, when you need it.  We recommend signing up for the patient portal called "MyChart".  Sign up information is provided on this After Visit Summary.  MyChart is used to connect with patients for Virtual Visits (Telemedicine).  Patients are able to view lab/test results, encounter notes, upcoming appointments, etc.  Non-urgent messages can be sent to your provider as well.   To learn more about what you can do with MyChart, go to [email or web address].    Your next appointment:   6 month(s)  The format for your next appointment:   In Person  Provider:   Brian Crenshaw, MD    

## 2022-03-02 ENCOUNTER — Other Ambulatory Visit: Payer: Self-pay | Admitting: Family Medicine

## 2022-03-02 DIAGNOSIS — N529 Male erectile dysfunction, unspecified: Secondary | ICD-10-CM

## 2022-03-21 LAB — HM DIABETES EYE EXAM

## 2022-03-23 ENCOUNTER — Encounter: Payer: Self-pay | Admitting: Family Medicine

## 2022-03-23 DIAGNOSIS — E1165 Type 2 diabetes mellitus with hyperglycemia: Secondary | ICD-10-CM

## 2022-03-23 MED ORDER — GLUCOSE BLOOD VI STRP: ORAL_STRIP | 3 refills | Status: DC

## 2022-04-04 DIAGNOSIS — G4733 Obstructive sleep apnea (adult) (pediatric): Secondary | ICD-10-CM | POA: Diagnosis not present

## 2022-04-26 ENCOUNTER — Encounter (HOSPITAL_COMMUNITY): Payer: Self-pay | Admitting: *Deleted

## 2022-05-05 DIAGNOSIS — G4733 Obstructive sleep apnea (adult) (pediatric): Secondary | ICD-10-CM | POA: Diagnosis not present

## 2022-05-31 ENCOUNTER — Encounter: Payer: Self-pay | Admitting: Family Medicine

## 2022-05-31 ENCOUNTER — Ambulatory Visit: Payer: BC Managed Care – PPO | Admitting: Family Medicine

## 2022-05-31 VITALS — BP 124/70 | HR 82 | Temp 98.2°F | Ht 71.0 in | Wt 201.8 lb

## 2022-05-31 DIAGNOSIS — I4891 Unspecified atrial fibrillation: Secondary | ICD-10-CM

## 2022-05-31 DIAGNOSIS — I10 Essential (primary) hypertension: Secondary | ICD-10-CM

## 2022-05-31 DIAGNOSIS — E1165 Type 2 diabetes mellitus with hyperglycemia: Secondary | ICD-10-CM | POA: Diagnosis not present

## 2022-05-31 DIAGNOSIS — E785 Hyperlipidemia, unspecified: Secondary | ICD-10-CM | POA: Diagnosis not present

## 2022-05-31 DIAGNOSIS — G4733 Obstructive sleep apnea (adult) (pediatric): Secondary | ICD-10-CM

## 2022-05-31 LAB — HEMOGLOBIN A1C: Hgb A1c MFr Bld: 7.1 % — ABNORMAL HIGH (ref 4.6–6.5)

## 2022-05-31 LAB — COMPREHENSIVE METABOLIC PANEL
ALT: 28 U/L (ref 0–53)
AST: 31 U/L (ref 0–37)
Albumin: 4.5 g/dL (ref 3.5–5.2)
Alkaline Phosphatase: 52 U/L (ref 39–117)
BUN: 10 mg/dL (ref 6–23)
CO2: 28 mEq/L (ref 19–32)
Calcium: 9.5 mg/dL (ref 8.4–10.5)
Chloride: 100 mEq/L (ref 96–112)
Creatinine, Ser: 0.87 mg/dL (ref 0.40–1.50)
GFR: 92.88 mL/min (ref >60.00)
Glucose, Bld: 125 mg/dL — ABNORMAL HIGH (ref 70–99)
Potassium: 4.5 mEq/L (ref 3.5–5.1)
Sodium: 137 mEq/L (ref 135–145)
Total Bilirubin: 0.7 mg/dL (ref 0.2–1.2)
Total Protein: 7.2 g/dL (ref 6.0–8.3)

## 2022-05-31 LAB — LIPID PANEL
Cholesterol: 107 mg/dL (ref 0–200)
HDL: 35.3 mg/dL — ABNORMAL LOW (ref >39.00)
LDL Cholesterol: 39 mg/dL (ref 0–99)
NonHDL: 72.15
Total CHOL/HDL Ratio: 3
Triglycerides: 164 mg/dL — ABNORMAL HIGH (ref 0.0–149.0)
VLDL: 32.8 mg/dL (ref 0.0–40.0)

## 2022-05-31 MED ORDER — METFORMIN HCL 1000 MG PO TABS: ORAL_TABLET | ORAL | 3 refills | Status: DC

## 2022-05-31 MED ORDER — EMPAGLIFLOZIN 10 MG PO TABS: 10.0000 mg | ORAL_TABLET | Freq: Every day | ORAL | 3 refills | Status: DC

## 2022-05-31 MED ORDER — ROSUVASTATIN CALCIUM 40 MG PO TABS: 40.0000 mg | ORAL_TABLET | Freq: Every day | ORAL | 3 refills | Status: DC

## 2022-05-31 NOTE — Assessment & Plan Note (Signed)
Uncontrolled with elevated A1c last visit.  Also suspect persistent elevated A1c based on home readings.  Depending on labs consider addition of DPP 4 versus GLP-1.  Continue metformin, Jardiance same doses for now.  Watch diet, exercise.

## 2022-05-31 NOTE — Assessment & Plan Note (Signed)
Still intermittent flares.  Plans to discuss with cardiology.  Tolerating anticoagulation, rate controlled.  No med changes for now.

## 2022-05-31 NOTE — Progress Notes (Signed)
Subjective:  Patient ID: Scott White, male    DOB: 09/28/1960  Age: 62 y.o. MRN: [id number]  CC:  Chief Complaint  Patient presents with   Diabetes   Hyperlipidemia   Hypertension    HPI Scott White presents for   Diabetes: Complicated by hyperglycemia, CAD. Metformin 1000 mg twice daily, Jardiance 10 mg daily.  Slight elevated A1c, plan for increased activity at his September visit.  Weight improved by few pounds since December. Trying to exercise, but some increased work. Some walks few days per week.  Home readings fasting: 141 Postprandial: close to 200-230 on some days depending on diet.   Symptomatic lows: none. Lowest 80 a few weeks ago.   Tried berberin supplement for blood sugar - past 6 months.  Microalbumin: normal ratio 11/30/2021 Optho, foot exam, pneumovax: Up-to-date.   Wt Readings from Last 3 Encounters:  05/31/22 201 lb 12.8 oz (91.5 kg)  02/26/22 204 lb 9.6 oz (92.8 kg)  11/30/21 196 lb 3.2 oz (89 kg)    Lab Results  Component Value Date   HGBA1C 7.6 (H) 11/30/2021   HGBA1C 6.5 (A) 05/29/2021   HGBA1C 7.5 (H) 02/24/2021   Lab Results  Component Value Date   MICROALBUR <0.7 11/30/2021   Numidia 42 11/30/2021   CREATININE 0.99 11/30/2021   Hypertension: With CAD, paroxysmal atrial fibrillation -  cardiology Dr. Stanford Breed.  Appointment in December.  Status post ablation.  Continued on beta-blocker and apixaban.  No new bleeding. Intermittent symptoms/episodes of A-fib.  If more frequent option of resuming Tikosyn or repeat ablation.  On CPAP for OSA. Lisinopril 20 mg daily. Still intermittent flares. Not persistent. No new CP/DOE.  Home readings: 125/80 BP Readings from Last 3 Encounters:  05/31/22 124/70  02/26/22 136/78  11/30/21 122/62   Lab Results  Component Value Date   CREATININE 0.99 11/30/2021   Hyperlipidemia: With history of CAD as above, continued on statin.  Discussed with his cardiologist in December.  Continued on  Crestor, Zetia, no new myalgias/side effects.  Lab Results  Component Value Date   CHOL 109 11/30/2021   HDL 32.50 (L) 11/30/2021   LDLCALC 42 11/30/2021   LDLDIRECT 65.0 11/09/2020   TRIG 173.0 (H) 11/30/2021   CHOLHDL 3 11/30/2021   Lab Results  Component Value Date   ALT 32 11/30/2021   AST 25 11/30/2021   ALKPHOS 49 11/30/2021   BILITOT 0.5 11/30/2021       History Patient Active Problem List   Diagnosis Date Noted   Type 2 diabetes mellitus with hyperglycemia, without long-term current use of insulin ([place]) 05/31/2022   OSA on CPAP 09/22/2010   Long term (current) use of anticoagulants 07/03/2010   Long term current use of anticoagulant 05/31/2010   Hyperlipidemia 09/27/2008   HYPERTRIGLYCERIDEMIA 02/11/2008   Essential hypertension 02/11/2008   CORONARY ATHEROSCLEROSIS NATIVE CORONARY ARTERY 02/11/2008   ATRIAL FIBRILLATION 02/11/2008    Past Medical History:  Diagnosis Date   Atrial fibrillation ([place])    a. Persistent, s/p afib ablation by [facility] 08/16/10;  b. s/p failed DCCV 05/2011; c. Chronic Tikosyn & Pradaxa (CHA2DS2VASc = 3-4).   CAD (coronary artery disease)    a. 07/2002 Inf MI s/p BMS -->RCA, otw nonobs dzs;  b. 12/2011 Neg MV.   Cancer [facility])    Phreesia 05/09/2020   CHF (congestive heart failure) ([place])    Phreesia 05/09/2020   Chronic diastolic CHF (congestive heart failure) ([place])    Diabetes ([place])  Diabetes mellitus without complication ([place])    Phreesia 05/09/2020   HTN (hypertension)    Hyperlipidemia    Hypertension    Phreesia 05/09/2020   Myocardial infarction [facility])    Phreesia 05/09/2020   PVC's (premature ventricular contractions)    Sleep apnea    followed by Dr Gwenette Greet      Review of Systems  Constitutional:  Negative for fatigue and unexpected weight change.  Eyes:  Negative for visual disturbance.  Respiratory:  Negative for cough, chest tightness and shortness of breath.   Cardiovascular:  Negative for chest pain, palpitations  and leg swelling.  Gastrointestinal:  Negative for abdominal pain and blood in stool.  Neurological:  Negative for dizziness, light-headedness and headaches.     Objective:   Vitals:   05/31/22 0823  BP: 124/70  Pulse: 82  Temp: 98.2 F (36.8 C)  TempSrc: Temporal  SpO2: 98%  Weight: 201 lb 12.8 oz (91.5 kg)  Height: '5\' 11"'$  (1.803 m)     Physical Exam Vitals reviewed.  Constitutional:      Appearance: He is well-developed.  HENT:     Head: Normocephalic and atraumatic.  Neck:     Vascular: No carotid bruit or JVD.  Cardiovascular:     Rate and Rhythm: Normal rate and regular rhythm.     Heart sounds: Normal heart sounds. No murmur heard. Pulmonary:     Effort: Pulmonary effort is normal.     Breath sounds: Normal breath sounds. No rales.  Musculoskeletal:     Right lower leg: No edema.     Left lower leg: No edema.  Skin:    General: Skin is warm and dry.  Neurological:     Mental Status: He is alert and oriented to person, place, and time.  Psychiatric:        Mood and Affect: Mood normal.        Assessment & Plan:  Scott White is a 62 y.o. male . Essential hypertension Assessment & Plan: Stable control, continue same regimen.  Check labs.  Orders: -     Comprehensive metabolic panel  Hyperlipidemia, unspecified hyperlipidemia type Assessment & Plan: Tolerating current dose Crestor, Zetia, continue same, updated labs ordered.  Orders: -     Rosuvastatin Calcium; Take 1 tablet (40 mg total) by mouth daily.  Dispense: 90 tablet; Refill: 3 -     Comprehensive metabolic panel -     Lipid panel  Type 2 diabetes mellitus with hyperglycemia, without long-term current use of insulin (HCC) Assessment & Plan: Uncontrolled with elevated A1c last visit.  Also suspect persistent elevated A1c based on home readings.  Depending on labs consider addition of DPP 4 versus GLP-1.  Continue metformin, Jardiance same doses for now.  Watch diet,  exercise.  Orders: -     Hemoglobin A1c -     Empagliflozin; Take 1 tablet (10 mg total) by mouth daily before breakfast.  Dispense: 90 tablet; Refill: 3 -     metFORMIN HCl; TAKE 1 TABLET (1000 MG TOTAL) BY MOUTH 2 (TWO) TIMES DAILY WITH A MEAL.  Dispense: 180 tablet; Refill: 3  Atrial fibrillation, unspecified type [facility]) Assessment & Plan: Still intermittent flares.  Plans to discuss with cardiology.  Tolerating anticoagulation, rate controlled.  No med changes for now.   OSA on CPAP Assessment & Plan: Tolerating CPAP, continue same.     Patient Instructions  Depending on A1c we can discuss other meds. No changes for now. Januvia may be  an option if only slightly elevated. I will let you know.  Take care!     Signed,   Merri Ray, MD [place] 05/31/22 9:02 AM

## 2022-05-31 NOTE — Assessment & Plan Note (Signed)
Stable control, continue same regimen.  Check labs.

## 2022-05-31 NOTE — Patient Instructions (Addendum)
Depending on A1c we can discuss other meds. No changes for now. Januvia may be an option if only slightly elevated. I will let you know.  Take care!

## 2022-05-31 NOTE — Assessment & Plan Note (Signed)
Tolerating CPAP, continue same.

## 2022-05-31 NOTE — Assessment & Plan Note (Signed)
Tolerating current dose Crestor, Zetia, continue same, updated labs ordered.

## 2022-06-03 DIAGNOSIS — G4733 Obstructive sleep apnea (adult) (pediatric): Secondary | ICD-10-CM | POA: Diagnosis not present

## 2022-06-20 DIAGNOSIS — H43811 Vitreous degeneration, right eye: Secondary | ICD-10-CM | POA: Diagnosis not present

## 2022-07-04 DIAGNOSIS — G4733 Obstructive sleep apnea (adult) (pediatric): Secondary | ICD-10-CM | POA: Diagnosis not present

## 2022-08-03 DIAGNOSIS — G4733 Obstructive sleep apnea (adult) (pediatric): Secondary | ICD-10-CM | POA: Diagnosis not present

## 2022-08-31 ENCOUNTER — Ambulatory Visit: Payer: BC Managed Care – PPO | Admitting: Family Medicine

## 2022-08-31 ENCOUNTER — Encounter: Payer: Self-pay | Admitting: Family Medicine

## 2022-08-31 VITALS — BP 122/70 | HR 65 | Temp 98.0°F | Ht 71.0 in | Wt 206.0 lb

## 2022-08-31 DIAGNOSIS — I10 Essential (primary) hypertension: Secondary | ICD-10-CM

## 2022-08-31 DIAGNOSIS — E1165 Type 2 diabetes mellitus with hyperglycemia: Secondary | ICD-10-CM | POA: Diagnosis not present

## 2022-08-31 DIAGNOSIS — G4733 Obstructive sleep apnea (adult) (pediatric): Secondary | ICD-10-CM | POA: Diagnosis not present

## 2022-08-31 DIAGNOSIS — E785 Hyperlipidemia, unspecified: Secondary | ICD-10-CM

## 2022-08-31 DIAGNOSIS — I4891 Unspecified atrial fibrillation: Secondary | ICD-10-CM

## 2022-08-31 DIAGNOSIS — Z7984 Long term (current) use of oral hypoglycemic drugs: Secondary | ICD-10-CM

## 2022-08-31 LAB — HEMOGLOBIN A1C: Hgb A1c MFr Bld: 6.6 % — ABNORMAL HIGH (ref 4.6–6.5)

## 2022-08-31 LAB — COMPREHENSIVE METABOLIC PANEL
ALT: 18 U/L (ref 0–53)
AST: 20 U/L (ref 0–37)
Albumin: 4.3 g/dL (ref 3.5–5.2)
Alkaline Phosphatase: 75 U/L (ref 39–117)
BUN: 12 mg/dL (ref 6–23)
CO2: 28 mEq/L (ref 19–32)
Calcium: 9.5 mg/dL (ref 8.4–10.5)
Chloride: 105 mEq/L (ref 96–112)
Creatinine, Ser: 0.76 mg/dL (ref 0.40–1.50)
GFR: 96.58 mL/min (ref >60.00)
Glucose, Bld: 112 mg/dL — ABNORMAL HIGH (ref 70–99)
Potassium: 3.9 mEq/L (ref 3.5–5.1)
Sodium: 141 mEq/L (ref 135–145)
Total Bilirubin: 0.7 mg/dL (ref 0.2–1.2)
Total Protein: 7.2 g/dL (ref 6.0–8.3)

## 2022-08-31 NOTE — Progress Notes (Signed)
Subjective:  Patient ID: Scott White, male    DOB: 01-01-1961  Age: 62 y.o. MRN: [id number]  CC:  Chief Complaint  Patient presents with   Medical Management of Chronic Issues    Pt is doing well     HPI Scott White presents for   Diabetes: Complicated by hyperglycemia, CAD, treated with metformin 1000 mg twice daily, Jardiance 10 mg daily.  Has also used berberine supplement. A1c improved at last visit, near goal.  Weight had improved and trying to exercise at that time. Walking, avoiding beer. Occasional cheat days for diet. Some fruits at times.  No new med side effects Home readings fasting: high of 165 today, usually 140-165 Postprandial: 160-180 Noticed lower blood sugar with exercising. Headache prior to lunch - lowest 70, at 90 feels HA. No lower readings.   Microalbumin: Normal ratio 11/30/2021 Optho, foot exam, pneumovax:  Ophthalmology exam: [facility] in January.  Lab Results  Component Value Date   HGBA1C 7.1 (H) 05/31/2022   HGBA1C 7.6 (H) 11/30/2021   HGBA1C 6.5 (A) 05/29/2021   Lab Results  Component Value Date   MICROALBUR <0.7 11/30/2021   LDLCALC 39 05/31/2022   CREATININE 0.87 05/31/2022    Hypertension: CAD, paroxysmal atrial fibrillation, followed by cardiology, Dr. Jens Som.  History of ablation.  Treated with beta-blocker, apixaban for anticoagulation.  Option of Tikosyn if increasing frequency of A-fib flares. Rare need for tikosyn for a few days at a time. Last needed few weeks ago.  Compliant with CPAP for OSA. Home readings: 1120-130/70-80 BP Readings from Last 3 Encounters:  08/31/22 122/70  05/31/22 124/70  02/26/22 136/78   Lab Results  Component Value Date   CREATININE 0.87 05/31/2022   Hyperlipidemia: Mild hypertriglyceridemia but stable LDL in March.  Continues on Crestor, Zetia without se's Lab Results  Component Value Date   CHOL 107 05/31/2022   HDL 35.30 (L) 05/31/2022   LDLCALC 39 05/31/2022    LDLDIRECT 65.0 11/09/2020   TRIG 164.0 (H) 05/31/2022   CHOLHDL 3 05/31/2022   Lab Results  Component Value Date   ALT 28 05/31/2022   AST 31 05/31/2022   ALKPHOS 52 05/31/2022   BILITOT 0.7 05/31/2022        History Patient Active Problem List   Diagnosis Date Noted   Type 2 diabetes mellitus with hyperglycemia, without long-term current use of insulin (HCC) 05/31/2022   OSA on CPAP 09/22/2010   Long term (current) use of anticoagulants 07/03/2010   Long term current use of anticoagulant 05/31/2010   Hyperlipidemia 09/27/2008   HYPERTRIGLYCERIDEMIA 02/11/2008   Essential hypertension 02/11/2008   CORONARY ATHEROSCLEROSIS NATIVE CORONARY ARTERY 02/11/2008   ATRIAL FIBRILLATION 02/11/2008   Past Medical History:  Diagnosis Date   Atrial fibrillation (HCC)    a. Persistent, s/p afib ablation by [facility] 08/16/10;  b. s/p failed DCCV 05/2011; c. Chronic Tikosyn & Pradaxa (CHA2DS2VASc = 3-4).   CAD (coronary artery disease)    a. 07/2002 Inf MI s/p BMS -->RCA, otw nonobs dzs;  b. 12/2011 Neg MV.   Cancer (HCC)    Phreesia 05/09/2020   CHF (congestive heart failure) (HCC)    Phreesia 05/09/2020   Chronic diastolic CHF (congestive heart failure) (HCC)    Diabetes (HCC)    Diabetes mellitus without complication (HCC)    Phreesia 05/09/2020   HTN (hypertension)    Hyperlipidemia    Hypertension    Phreesia 05/09/2020   Myocardial infarction [facility])    Phreesia  05/09/2020   PVC's (premature ventricular contractions)    Sleep apnea    followed by Dr [name]   Past Surgical History:  Procedure Laterality Date   atrial ablatio  08/16/10   afib ablation by [facility]   ATRIAL FIBRILLATION ABLATION N/A 11/29/2020   Procedure: ATRIAL FIBRILLATION ABLATION;  Surgeon: Hillis Range, MD;  Location: [facility];  Service: Cardiovascular;  Laterality: N/A;   CARDIAC CATHETERIZATION  2004    Successful PTCA and stent placement in the mid right   coronary artery with an extra 99% narrowing to  0% with placement of a 3.5 x  20 mm Express 2 stent with improvement of TIMI grade 1 flow to TIMI grade 3.   CARDIOVERSION  06/15/2011   Procedure: CARDIOVERSION;  Surgeon: Pricilla Riffle, MD;  Location: [facility] OR;  Service: Cardiovascular;  Laterality: N/A;   HERNIA REPAIR N/A    Phreesia 05/09/2020   tonsilectomy     No Known Allergies Prior to Admission medications   Medication Sig Start Date End Date Taking? Authorizing Provider  ELIQUIS 5 MG TABS tablet TAKE 1 TABLET TWICE A DAY 11/29/21  Yes Lewayne Bunting, MD  empagliflozin (JARDIANCE) 10 MG TABS tablet Take 1 tablet (10 mg total) by mouth daily before breakfast. 05/31/22  Yes Shade Flood, MD  ezetimibe (ZETIA) 10 MG tablet TAKE 1 TABLET DAILY 02/22/22  Yes Lewayne Bunting, MD  glucose blood test strip Use as instructed - 1-2 per day. 03/23/22  Yes Shade Flood, MD  lisinopril (ZESTRIL) 20 MG tablet TAKE 1 TABLET BY MOUTH EVERY DAY IN THE EVENING 02/26/22  Yes Crenshaw, Madolyn Frieze, MD  Magnesium 100 MG TABS Take 400 mg by mouth 2 (two) times daily.   Yes [provider]  metFORMIN (GLUCOPHAGE) 1000 MG tablet TAKE 1 TABLET (1000 MG TOTAL) BY MOUTH 2 (TWO) TIMES DAILY WITH A MEAL. 05/31/22  Yes Shade Flood, MD  Multiple Vitamins-Minerals (CENTRUM PO) Take 1 tablet by mouth daily.   Yes [provider]  nitroGLYCERIN (NITROSTAT) 0.4 MG SL tablet Place 1 tablet (0.4 mg total) under the tongue every 5 (five) minutes as needed. For chest pain at 3rd dose call 911 06/09/19  Yes Crenshaw, Madolyn Frieze, MD  NON FORMULARY BERVERINE supplement   Yes [provider]  Omega-3 Fatty Acids (FISH OIL PO) Take 1,400 mg by mouth 2 (two) times daily.   Yes [provider]  rosuvastatin (CRESTOR) 40 MG tablet Take 1 tablet (40 mg total) by mouth daily. 05/31/22  Yes Shade Flood, MD  tadalafil (CIALIS) 10 MG tablet TAKE 1 TABLET DAILY AS NEEDED FOR ERECTILE DYSFUNCTION. DO NOT USE IF TAKING NITROGLYCERIN 03/02/22  Yes  Shade Flood, MD   Social History   Socioeconomic History   Marital status: Single    Spouse name: Not on file   Number of children: 0   Years of education: Not on file   Highest education level: Bachelor's degree (e.g., BA, AB, BS)  Occupational History   Occupation: Engineer, mining: [employer]  Tobacco Use   Smoking status: Never   Smokeless tobacco: Never  Vaping Use   Vaping Use: Never used  Substance and Sexual Activity   Alcohol use: Yes    Alcohol/week: 1.0 standard drink of alcohol    Types: 1 Cans of beer per week    Comment:  three times a week   Drug use: No  Sexual activity: Yes  Other Topics Concern   Not on file  Social History Narrative   Pt lives in [place] alone.  Single.   He is a Engineer, water for [employer]   Social Determinants of Health   Financial Resource Strain: Low Risk  (08/30/2022)   Overall Financial Resource Strain (CARDIA)    Difficulty of Paying Living Expenses: Not hard at all  Food Insecurity: No Food Insecurity (08/30/2022)   Hunger Vital Sign    Worried About Running Out of Food in the Last Year: Never true    Ran Out of Food in the Last Year: Never true  Transportation Needs: No Transportation Needs (08/30/2022)   PRAPARE - Administrator, Civil Service (Medical): No    Lack of Transportation (Non-Medical): No  Physical Activity: Sufficiently Active (08/30/2022)   Exercise Vital Sign    Days of Exercise per Week: 3 days    Minutes of Exercise per Session: 60 min  Stress: No Stress Concern Present (08/30/2022)   [employer] of Occupational Health - Occupational Stress Questionnaire    Feeling of Stress : Only a little  Social Connections: Unknown (08/30/2022)   Social Connection and Isolation Panel [NHANES]    Frequency of Communication with Friends and Family: Three times a week    Frequency of Social Gatherings with Friends and Family: Once a week    Attends Religious  Services: Patient declined    Database administrator or Organizations: No    Attends Engineer, structural: Not on file    Marital Status: Never married  Intimate Partner Violence: Not on file    Review of Systems  Constitutional:  Negative for fatigue and unexpected weight change.  Eyes:  Negative for visual disturbance.  Respiratory:  Positive for shortness of breath (When first starts exercise but then resolves as he continues to exercise.  No new symptoms.). Negative for cough and chest tightness.   Cardiovascular:  Negative for chest pain, palpitations and leg swelling.  Gastrointestinal:  Negative for abdominal pain and blood in stool.  Neurological:  Negative for dizziness, light-headedness and headaches.   Per HPI  Objective:   Vitals:   08/31/22 0817  BP: 122/70  Pulse: 65  Temp: 98 F (36.7 C)  TempSrc: Temporal  SpO2: 96%  Weight: 206 lb (93.4 kg)  Height: 5\' 11"  (1.803 m)     Physical Exam Vitals reviewed.  Constitutional:      Appearance: He is well-developed.  HENT:     Head: Normocephalic and atraumatic.  Neck:     Vascular: No carotid bruit or JVD.  Cardiovascular:     Rate and Rhythm: Normal rate and regular rhythm.     Heart sounds: Normal heart sounds. No murmur heard. Pulmonary:     Effort: Pulmonary effort is normal.     Breath sounds: Normal breath sounds. No rales.  Musculoskeletal:     Right lower leg: No edema.     Left lower leg: No edema.  Skin:    General: Skin is warm and dry.  Neurological:     Mental Status: He is alert and oriented to person, place, and time.  Psychiatric:        Mood and Affect: Mood normal.        Assessment & Plan:  Scott White is a 62 y.o. male . Type 2 diabetes mellitus with hyperglycemia, without long-term current use of insulin (HCC) - Plan: Hemoglobin A1c, Comprehensive  metabolic panel  Essential hypertension  Atrial fibrillation, unspecified type (HCC)  OSA on  CPAP  Hyperlipidemia, unspecified hyperlipidemia type  Tolerating current med regimen, no true hypoglycemia but does feel symptomatic when he is in the 70-90 range.  Healthy snacks throughout the day discussed, handout on diabetes and nutrition.  Check updated A1c, CMP, consider addition of DPP 4 but would be cautious of true hypoglycemia with any additional medications.  Recheck 3 months.   No orders of the defined types were placed in this encounter.  Patient Instructions  See info on diet below, healthy snack between meals if needed. Depending on A1c, we may add another med.   Diabetes Mellitus and Nutrition, Adult When you have diabetes, or diabetes mellitus, it is very important to have healthy eating habits because your blood sugar (glucose) levels are greatly affected by what you eat and drink. Eating healthy foods in the right amounts, at about the same times every day, can help you: Manage your blood glucose. Lower your risk of heart disease. Improve your blood pressure. Reach or maintain a healthy weight. What can affect my meal plan? Every person with diabetes is different, and each person has different needs for a meal plan. Your health care provider may recommend that you work with a dietitian to make a meal plan that is best for you. Your meal plan may vary depending on factors such as: The calories you need. The medicines you take. Your weight. Your blood glucose, blood pressure, and cholesterol levels. Your activity level. Other health conditions you have, such as heart or kidney disease. How do carbohydrates affect me? Carbohydrates, also called carbs, affect your blood glucose level more than any other type of food. Eating carbs raises the amount of glucose in your blood. It is important to know how many carbs you can safely have in each meal. This is different for every person. Your dietitian can help you calculate how many carbs you should have at each meal and for each  snack. How does alcohol affect me? Alcohol can cause a decrease in blood glucose (hypoglycemia), especially if you use insulin or take certain diabetes medicines by mouth. Hypoglycemia can be a life-threatening condition. Symptoms of hypoglycemia, such as sleepiness, dizziness, and confusion, are similar to symptoms of having too much alcohol. Do not drink alcohol if: Your health care provider tells you not to drink. You are pregnant, may be pregnant, or are planning to become pregnant. If you drink alcohol: Limit how much you have to: 0-1 drink a day for women. 0-2 drinks a day for men. Know how much alcohol is in your drink. In the [place] one drink equals one 12 oz bottle of beer (355 mL), one 5 oz glass of wine (148 mL), or one 1 oz glass of hard liquor (44 mL). Keep yourself hydrated with water, diet soda, or unsweetened iced tea. Keep in mind that regular soda, juice, and other mixers may contain a lot of sugar and must be counted as carbs. What are tips for following this plan?  Reading food labels Start by checking the serving size on the Nutrition Facts label of packaged foods and drinks. The number of calories and the amount of carbs, fats, and other nutrients listed on the label are based on one serving of the item. Many items contain more than one serving per package. Check the total grams (g) of carbs in one serving. Check the number of grams of saturated fats and trans fats in  one serving. Choose foods that have a low amount or none of these fats. Check the number of milligrams (mg) of salt (sodium) in one serving. Most people should limit total sodium intake to less than 2,300 mg per day. Always check the nutrition information of foods labeled as "low-fat" or "nonfat." These foods may be higher in added sugar or refined carbs and should be avoided. Talk to your dietitian to identify your daily goals for nutrients listed on the label. Shopping Avoid buying canned, pre-made, or  processed foods. These foods tend to be high in fat, sodium, and added sugar. Shop around the outside edge of the grocery store. This is where you will most often find fresh fruits and vegetables, bulk grains, fresh meats, and fresh dairy products. Cooking Use low-heat cooking methods, such as baking, instead of high-heat cooking methods, such as deep frying. Cook using healthy oils, such as olive, canola, or sunflower oil. Avoid cooking with butter, cream, or high-fat meats. Meal planning Eat meals and snacks regularly, preferably at the same times every day. Avoid going long periods of time without eating. Eat foods that are high in fiber, such as fresh fruits, vegetables, beans, and whole grains. Eat 4-6 oz (112-168 g) of lean protein each day, such as lean meat, chicken, fish, eggs, or tofu. One ounce (oz) (28 g) of lean protein is equal to: 1 oz (28 g) of meat, chicken, or fish. 1 egg.  cup (62 g) of tofu. Eat some foods each day that contain healthy fats, such as avocado, nuts, seeds, and fish. What foods should I eat? Fruits Berries. Apples. Oranges. Peaches. Apricots. Plums. Grapes. Mangoes. Papayas. Pomegranates. Kiwi. Cherries. Vegetables Leafy greens, including lettuce, spinach, kale, chard, collard greens, mustard greens, and cabbage. Beets. Cauliflower. Broccoli. Carrots. Green beans. Tomatoes. Peppers. Onions. Cucumbers. Brussels sprouts. Grains Whole grains, such as whole-wheat or whole-grain bread, crackers, tortillas, cereal, and pasta. Unsweetened oatmeal. Quinoa. Brown or wild rice. Meats and other proteins Seafood. Poultry without skin. Lean cuts of poultry and beef. Tofu. Nuts. Seeds. Dairy Low-fat or fat-free dairy products such as milk, yogurt, and cheese. The items listed above may not be a complete list of foods and beverages you can eat and drink. Contact a dietitian for more information. What foods should I avoid? Fruits Fruits canned with  syrup. Vegetables Canned vegetables. Frozen vegetables with butter or cream sauce. Grains Refined white flour and flour products such as bread, pasta, snack foods, and cereals. Avoid all processed foods. Meats and other proteins Fatty cuts of meat. Poultry with skin. Breaded or fried meats. Processed meat. Avoid saturated fats. Dairy Full-fat yogurt, cheese, or milk. Beverages Sweetened drinks, such as soda or iced tea. The items listed above may not be a complete list of foods and beverages you should avoid. Contact a dietitian for more information. Questions to ask a health care provider Do I need to meet with a certified diabetes care and education specialist? Do I need to meet with a dietitian? What number can I call if I have questions? When are the best times to check my blood glucose? Where to find more information: American Diabetes Association: diabetes.org Academy of Nutrition and Dietetics: eatright.[employer] of Diabetes and Digestive and Kidney Diseases: StageSync.si Association of Diabetes Care & Education Specialists: diabeteseducator.org Summary It is important to have healthy eating habits because your blood sugar (glucose) levels are greatly affected by what you eat and drink. It is important to use alcohol carefully. A healthy meal  plan will help you manage your blood glucose and lower your risk of heart disease. Your health care provider may recommend that you work with a dietitian to make a meal plan that is best for you. This information is not intended to replace advice given to you by your health care provider. Make sure you discuss any questions you have with your health care provider. Document Revised: 10/07/2019 Document Reviewed: 10/07/2019 Elsevier Patient Education  2024 Elsevier Inc.     Signed,   Meredith Staggers, MD [facility], [facility] 08/31/22 8:35 AM

## 2022-08-31 NOTE — Patient Instructions (Addendum)
See info on diet below, healthy snack between meals if needed. Depending on A1c, we may add another med.   Diabetes Mellitus and Nutrition, Adult When you have diabetes, or diabetes mellitus, it is very important to have healthy eating habits because your blood sugar (glucose) levels are greatly affected by what you eat and drink. Eating healthy foods in the right amounts, at about the same times every day, can help you: Manage your blood glucose. Lower your risk of heart disease. Improve your blood pressure. Reach or maintain a healthy weight. What can affect my meal plan? Every person with diabetes is different, and each person has different needs for a meal plan. Your health care provider may recommend that you work with a dietitian to make a meal plan that is best for you. Your meal plan may vary depending on factors such as: The calories you need. The medicines you take. Your weight. Your blood glucose, blood pressure, and cholesterol levels. Your activity level. Other health conditions you have, such as heart or kidney disease. How do carbohydrates affect me? Carbohydrates, also called carbs, affect your blood glucose level more than any other type of food. Eating carbs raises the amount of glucose in your blood. It is important to know how many carbs you can safely have in each meal. This is different for every person. Your dietitian can help you calculate how many carbs you should have at each meal and for each snack. How does alcohol affect me? Alcohol can cause a decrease in blood glucose (hypoglycemia), especially if you use insulin or take certain diabetes medicines by mouth. Hypoglycemia can be a life-threatening condition. Symptoms of hypoglycemia, such as sleepiness, dizziness, and confusion, are similar to symptoms of having too much alcohol. Do not drink alcohol if: Your health care provider tells you not to drink. You are pregnant, may be pregnant, or are planning to become  pregnant. If you drink alcohol: Limit how much you have to: 0-1 drink a day for women. 0-2 drinks a day for men. Know how much alcohol is in your drink. In the [place] one drink equals one 12 oz bottle of beer (355 mL), one 5 oz glass of wine (148 mL), or one 1 oz glass of hard liquor (44 mL). Keep yourself hydrated with water, diet soda, or unsweetened iced tea. Keep in mind that regular soda, juice, and other mixers may contain a lot of sugar and must be counted as carbs. What are tips for following this plan?  Reading food labels Start by checking the serving size on the Nutrition Facts label of packaged foods and drinks. The number of calories and the amount of carbs, fats, and other nutrients listed on the label are based on one serving of the item. Many items contain more than one serving per package. Check the total grams (g) of carbs in one serving. Check the number of grams of saturated fats and trans fats in one serving. Choose foods that have a low amount or none of these fats. Check the number of milligrams (mg) of salt (sodium) in one serving. Most people should limit total sodium intake to less than 2,300 mg per day. Always check the nutrition information of foods labeled as "low-fat" or "nonfat." These foods may be higher in added sugar or refined carbs and should be avoided. Talk to your dietitian to identify your daily goals for nutrients listed on the label. Shopping Avoid buying canned, pre-made, or processed foods. These foods tend to  be high in fat, sodium, and added sugar. Shop around the outside edge of the grocery store. This is where you will most often find fresh fruits and vegetables, bulk grains, fresh meats, and fresh dairy products. Cooking Use low-heat cooking methods, such as baking, instead of high-heat cooking methods, such as deep frying. Cook using healthy oils, such as olive, canola, or sunflower oil. Avoid cooking with butter, cream, or high-fat meats. Meal  planning Eat meals and snacks regularly, preferably at the same times every day. Avoid going long periods of time without eating. Eat foods that are high in fiber, such as fresh fruits, vegetables, beans, and whole grains. Eat 4-6 oz (112-168 g) of lean protein each day, such as lean meat, chicken, fish, eggs, or tofu. One ounce (oz) (28 g) of lean protein is equal to: 1 oz (28 g) of meat, chicken, or fish. 1 egg.  cup (62 g) of tofu. Eat some foods each day that contain healthy fats, such as avocado, nuts, seeds, and fish. What foods should I eat? Fruits Berries. Apples. Oranges. Peaches. Apricots. Plums. Grapes. Mangoes. Papayas. Pomegranates. Kiwi. Cherries. Vegetables Leafy greens, including lettuce, spinach, kale, chard, collard greens, mustard greens, and cabbage. Beets. Cauliflower. Broccoli. Carrots. Green beans. Tomatoes. Peppers. Onions. Cucumbers. Brussels sprouts. Grains Whole grains, such as whole-wheat or whole-grain bread, crackers, tortillas, cereal, and pasta. Unsweetened oatmeal. Quinoa. Brown or wild rice. Meats and other proteins Seafood. Poultry without skin. Lean cuts of poultry and beef. Tofu. Nuts. Seeds. Dairy Low-fat or fat-free dairy products such as milk, yogurt, and cheese. The items listed above may not be a complete list of foods and beverages you can eat and drink. Contact a dietitian for more information. What foods should I avoid? Fruits Fruits canned with syrup. Vegetables Canned vegetables. Frozen vegetables with butter or cream sauce. Grains Refined white flour and flour products such as bread, pasta, snack foods, and cereals. Avoid all processed foods. Meats and other proteins Fatty cuts of meat. Poultry with skin. Breaded or fried meats. Processed meat. Avoid saturated fats. Dairy Full-fat yogurt, cheese, or milk. Beverages Sweetened drinks, such as soda or iced tea. The items listed above may not be a complete list of foods and beverages you  should avoid. Contact a dietitian for more information. Questions to ask a health care provider Do I need to meet with a certified diabetes care and education specialist? Do I need to meet with a dietitian? What number can I call if I have questions? When are the best times to check my blood glucose? Where to find more information: American Diabetes Association: diabetes.org Academy of Nutrition and Dietetics: eatright.[employer] of Diabetes and Digestive and Kidney Diseases: [employer]Sync[employer]si Association of Diabetes Care & Education Specialists: diabeteseducator.org Summary It is important to have healthy eating habits because your blood sugar (glucose) levels are greatly affected by what you eat and drink. It is important to use alcohol carefully. A healthy meal plan will help you manage your blood glucose and lower your risk of heart disease. Your health care provider may recommend that you work with a dietitian to make a meal plan that is best for you. This information is not intended to replace advice given to you by your health care provider. Make sure you discuss any questions you have with your health care provider. Document Revised: 10/07/2019 Document Reviewed: 10/07/2019 Elsevier Patient Education  2024 [employer].

## 2022-09-03 DIAGNOSIS — G4733 Obstructive sleep apnea (adult) (pediatric): Secondary | ICD-10-CM | POA: Diagnosis not present

## 2022-10-03 DIAGNOSIS — G4733 Obstructive sleep apnea (adult) (pediatric): Secondary | ICD-10-CM | POA: Diagnosis not present

## 2022-11-01 ENCOUNTER — Encounter (INDEPENDENT_AMBULATORY_CARE_PROVIDER_SITE_OTHER): Payer: Self-pay

## 2022-11-03 DIAGNOSIS — G4733 Obstructive sleep apnea (adult) (pediatric): Secondary | ICD-10-CM | POA: Diagnosis not present

## 2022-11-08 DIAGNOSIS — D225 Melanocytic nevi of trunk: Secondary | ICD-10-CM | POA: Diagnosis not present

## 2022-11-08 DIAGNOSIS — L821 Other seborrheic keratosis: Secondary | ICD-10-CM | POA: Diagnosis not present

## 2022-11-08 DIAGNOSIS — Z08 Encounter for follow-up examination after completed treatment for malignant neoplasm: Secondary | ICD-10-CM | POA: Diagnosis not present

## 2022-11-08 DIAGNOSIS — L570 Actinic keratosis: Secondary | ICD-10-CM | POA: Diagnosis not present

## 2022-11-08 DIAGNOSIS — L814 Other melanin hyperpigmentation: Secondary | ICD-10-CM | POA: Diagnosis not present

## 2022-11-22 ENCOUNTER — Encounter: Payer: Self-pay | Admitting: Family Medicine

## 2022-11-23 ENCOUNTER — Emergency Department (HOSPITAL_BASED_OUTPATIENT_CLINIC_OR_DEPARTMENT_OTHER)
Admission: EM | Admit: 2022-11-23 | Discharge: 2022-11-23 | Disposition: A | Discharge status: Home / Self Care | Payer: BC Managed Care – PPO | Attending: Emergency Medicine | Admitting: Emergency Medicine

## 2022-11-23 ENCOUNTER — Other Ambulatory Visit: Payer: Self-pay

## 2022-11-23 ENCOUNTER — Emergency Department (HOSPITAL_BASED_OUTPATIENT_CLINIC_OR_DEPARTMENT_OTHER): Payer: BC Managed Care – PPO

## 2022-11-23 ENCOUNTER — Encounter (HOSPITAL_BASED_OUTPATIENT_CLINIC_OR_DEPARTMENT_OTHER): Payer: Self-pay | Admitting: Emergency Medicine

## 2022-11-23 DIAGNOSIS — Z79899 Other long term (current) drug therapy: Secondary | ICD-10-CM | POA: Diagnosis not present

## 2022-11-23 DIAGNOSIS — S8292XA Unspecified fracture of left lower leg, initial encounter for closed fracture: Secondary | ICD-10-CM | POA: Diagnosis not present

## 2022-11-23 DIAGNOSIS — S80212A Abrasion, left knee, initial encounter: Secondary | ICD-10-CM | POA: Insufficient documentation

## 2022-11-23 DIAGNOSIS — Z7901 Long term (current) use of anticoagulants: Secondary | ICD-10-CM | POA: Diagnosis not present

## 2022-11-23 DIAGNOSIS — Y92410 Unspecified street and highway as the place of occurrence of the external cause: Secondary | ICD-10-CM | POA: Diagnosis not present

## 2022-11-23 DIAGNOSIS — Z7984 Long term (current) use of oral hypoglycemic drugs: Secondary | ICD-10-CM | POA: Diagnosis not present

## 2022-11-23 DIAGNOSIS — S82432A Displaced oblique fracture of shaft of left fibula, initial encounter for closed fracture: Secondary | ICD-10-CM | POA: Diagnosis not present

## 2022-11-23 DIAGNOSIS — S8992XA Unspecified injury of left lower leg, initial encounter: Secondary | ICD-10-CM | POA: Diagnosis not present

## 2022-11-23 DIAGNOSIS — S82832A Other fracture of upper and lower end of left fibula, initial encounter for closed fracture: Secondary | ICD-10-CM

## 2022-11-23 MED ORDER — MORPHINE SULFATE 15 MG PO TABS: 15.0000 mg | ORAL_TABLET | ORAL | 0 refills | Status: AC | PRN

## 2022-11-23 NOTE — Discharge Instructions (Signed)
You were seen in the Emergency Department for left ankle injury We did find a fracture of your left ankle (distal fibula) We discussed this with orthopedics who agrees to the plan for a walking boot and crutches It is important that you call Dr. Thad Ranger office to schedule a follow-up visit to be seen by orthopedics in the office We have called in a prescription for morphine for you to take as directed for severe pain only Do not drink alcohol or drive while taking this medication For mild to moderate pain continue taking Advil as you have been Return to the emerged part for severe pain or any other concerns
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
## 2022-11-26 DIAGNOSIS — S82892A Other fracture of left lower leg, initial encounter for closed fracture: Secondary | ICD-10-CM | POA: Diagnosis not present

## 2022-12-03 ENCOUNTER — Ambulatory Visit: Payer: BC Managed Care – PPO | Admitting: Family Medicine

## 2022-12-03 ENCOUNTER — Encounter: Payer: Self-pay | Admitting: Family Medicine

## 2022-12-03 VITALS — BP 120/72 | HR 69 | Temp 98.2°F | Wt 201.3 lb

## 2022-12-03 DIAGNOSIS — I10 Essential (primary) hypertension: Secondary | ICD-10-CM

## 2022-12-03 DIAGNOSIS — I4891 Unspecified atrial fibrillation: Secondary | ICD-10-CM | POA: Diagnosis not present

## 2022-12-03 DIAGNOSIS — E785 Hyperlipidemia, unspecified: Secondary | ICD-10-CM | POA: Diagnosis not present

## 2022-12-03 DIAGNOSIS — G4733 Obstructive sleep apnea (adult) (pediatric): Secondary | ICD-10-CM

## 2022-12-03 DIAGNOSIS — Z23 Encounter for immunization: Secondary | ICD-10-CM

## 2022-12-03 DIAGNOSIS — E1165 Type 2 diabetes mellitus with hyperglycemia: Secondary | ICD-10-CM

## 2022-12-03 DIAGNOSIS — Z7984 Long term (current) use of oral hypoglycemic drugs: Secondary | ICD-10-CM | POA: Diagnosis not present

## 2022-12-03 DIAGNOSIS — E7800 Pure hypercholesterolemia, unspecified: Secondary | ICD-10-CM

## 2022-12-03 DIAGNOSIS — S82832D Other fracture of upper and lower end of left fibula, subsequent encounter for closed fracture with routine healing: Secondary | ICD-10-CM

## 2022-12-03 LAB — COMPREHENSIVE METABOLIC PANEL WITH GFR
ALT: 23 U/L (ref 0–53)
AST: 27 U/L (ref 0–37)
Albumin: 4.2 g/dL (ref 3.5–5.2)
Alkaline Phosphatase: 47 U/L (ref 39–117)
BUN: 14 mg/dL (ref 6–23)
CO2: 25 meq/L (ref 19–32)
Calcium: 9.3 mg/dL (ref 8.4–10.5)
Chloride: 100 meq/L (ref 96–112)
Creatinine, Ser: 0.83 mg/dL (ref 0.40–1.50)
GFR: 93.88 mL/min
Glucose, Bld: 118 mg/dL — ABNORMAL HIGH (ref 70–99)
Potassium: 4.5 meq/L (ref 3.5–5.1)
Sodium: 137 meq/L (ref 135–145)
Total Bilirubin: 0.6 mg/dL (ref 0.2–1.2)
Total Protein: 7 g/dL (ref 6.0–8.3)

## 2022-12-03 LAB — LIPID PANEL
Cholesterol: 116 mg/dL (ref 0–200)
HDL: 34.1 mg/dL — ABNORMAL LOW (ref >39.00)
LDL Cholesterol: 36 mg/dL (ref 0–99)
NonHDL: 82.17
Total CHOL/HDL Ratio: 3
Triglycerides: 229 mg/dL — ABNORMAL HIGH (ref 0.0–149.0)
VLDL: 45.8 mg/dL — ABNORMAL HIGH (ref 0.0–40.0)

## 2022-12-03 LAB — HEMOGLOBIN A1C: Hgb A1c MFr Bld: 7.3 % — ABNORMAL HIGH (ref 4.6–6.5)

## 2022-12-03 MED ORDER — LISINOPRIL 20 MG PO TABS
ORAL_TABLET | ORAL | 3 refills | Status: DC
Start: 2022-12-03 — End: 2022-12-10

## 2022-12-03 MED ORDER — EZETIMIBE 10 MG PO TABS
10.0000 mg | ORAL_TABLET | Freq: Every day | ORAL | 3 refills | Status: DC
Start: 2022-12-03 — End: 2023-12-19
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
## 2022-12-04 DIAGNOSIS — G4733 Obstructive sleep apnea (adult) (pediatric): Secondary | ICD-10-CM | POA: Diagnosis not present
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
## 2022-12-10 ENCOUNTER — Encounter: Payer: Self-pay | Admitting: Adult Health

## 2022-12-10 ENCOUNTER — Ambulatory Visit: Payer: BC Managed Care – PPO | Attending: Adult Health | Admitting: Adult Health

## 2022-12-10 VITALS — BP 118/72 | HR 81 | Ht 71.0 in | Wt 204.4 lb

## 2022-12-10 DIAGNOSIS — I10 Essential (primary) hypertension: Secondary | ICD-10-CM

## 2022-12-10 DIAGNOSIS — I2510 Atherosclerotic heart disease of native coronary artery without angina pectoris: Secondary | ICD-10-CM | POA: Diagnosis not present

## 2022-12-10 DIAGNOSIS — E7800 Pure hypercholesterolemia, unspecified: Secondary | ICD-10-CM

## 2022-12-10 DIAGNOSIS — I480 Paroxysmal atrial fibrillation: Secondary | ICD-10-CM

## 2022-12-10 MED ORDER — LISINOPRIL 20 MG PO TABS: ORAL_TABLET | ORAL | 3 refills | Status: DC

## 2022-12-10 MED ORDER — APIXABAN 5 MG PO TABS: 5.0000 mg | ORAL_TABLET | Freq: Two times a day (BID) | ORAL | 3 refills | Status: DC

## 2022-12-10 NOTE — Patient Instructions (Signed)
Medication Instructions:  No Changes *If you need a refill on your cardiac medications before your next appointment, please call your pharmacy*   Lab Work: No Labs If you have labs (blood work) drawn today and your tests are completely normal, you will receive your results only by: MyChart Message (if you have MyChart) OR A paper copy in the mail If you have any lab test that is abnormal or we need to change your treatment, we will call you to review the results.   Testing/Procedures: No Testing   Follow-Up: At [facility], you and your health needs are our priority.  As part of our continuing mission to provide you with exceptional heart care, we have created designated Provider Care Teams.  These Care Teams include your primary Cardiologist (physician) and Advanced Practice Providers (APPs -  Physician Assistants and Nurse Practitioners) who all work together to provide you with the care you need, when you need it.  We recommend signing up for the patient portal called "MyChart".  Sign up information is provided on this After Visit Summary.  MyChart is used to connect with patients for Virtual Visits (Telemedicine).  Patients are able to view lab/test results, encounter notes, upcoming appointments, etc.  Non-urgent messages can be sent to your provider as well.   To learn more about what you can do with MyChart, go to [email or web address].    Your next appointment:   1 year(s)  Provider:   Olga Millers, MD

## 2022-12-17 DIAGNOSIS — S82432A Displaced oblique fracture of shaft of left fibula, initial encounter for closed fracture: Secondary | ICD-10-CM | POA: Diagnosis not present

## 2022-12-17 DIAGNOSIS — S82892A Other fracture of left lower leg, initial encounter for closed fracture: Secondary | ICD-10-CM | POA: Diagnosis not present

## 2022-12-19 ENCOUNTER — Telehealth: Payer: Self-pay

## 2022-12-19 NOTE — Telephone Encounter (Signed)
Transition Care Management Unsuccessful Follow-up Telephone Call  Date of discharge and from where:  11/23/2022 [facility]  Attempts:  1st Attempt  Reason for unsuccessful TCM follow-up call:  Left voice message  Lary Eckardt [facility]  [facility], [facility] Resource Care Guide Direct Dial: [phone]  Website: [email or web address]

## 2022-12-19 NOTE — Telephone Encounter (Signed)
Transition Care Management Unsuccessful Follow-up Telephone Call  Date of discharge and from where:  11/23/2022 [facility]  Attempts:  2nd Attempt  Reason for unsuccessful TCM follow-up call:  Left voice message  Scott White [facility]  [facility], [facility] Guide Direct Dial: [phone]  Website: [email or web address]

## 2023-01-03 DIAGNOSIS — G4733 Obstructive sleep apnea (adult) (pediatric): Secondary | ICD-10-CM | POA: Diagnosis not present

## 2023-01-09 DIAGNOSIS — S82892A Other fracture of left lower leg, initial encounter for closed fracture: Secondary | ICD-10-CM | POA: Diagnosis not present

## 2023-01-09 DIAGNOSIS — S82432D Displaced oblique fracture of shaft of left fibula, subsequent encounter for closed fracture with routine healing: Secondary | ICD-10-CM | POA: Diagnosis not present

## 2023-01-14 ENCOUNTER — Other Ambulatory Visit: Payer: Self-pay | Admitting: Family Medicine

## 2023-01-14 DIAGNOSIS — E1165 Type 2 diabetes mellitus with hyperglycemia: Secondary | ICD-10-CM

## 2023-02-18 DIAGNOSIS — L821 Other seborrheic keratosis: Secondary | ICD-10-CM | POA: Diagnosis not present

## 2023-02-25 ENCOUNTER — Other Ambulatory Visit: Payer: Self-pay | Admitting: Family Medicine

## 2023-02-25 DIAGNOSIS — N529 Male erectile dysfunction, unspecified: Secondary | ICD-10-CM

## 2023-03-19 ENCOUNTER — Emergency Department (HOSPITAL_BASED_OUTPATIENT_CLINIC_OR_DEPARTMENT_OTHER): Payer: BC Managed Care – PPO | Admitting: Radiology

## 2023-03-19 ENCOUNTER — Encounter (HOSPITAL_BASED_OUTPATIENT_CLINIC_OR_DEPARTMENT_OTHER): Payer: Self-pay

## 2023-03-19 ENCOUNTER — Other Ambulatory Visit: Payer: Self-pay

## 2023-03-19 ENCOUNTER — Emergency Department (HOSPITAL_BASED_OUTPATIENT_CLINIC_OR_DEPARTMENT_OTHER)
Admission: EM | Admit: 2023-03-19 | Discharge: 2023-03-19 | Disposition: A | Discharge status: Home / Self Care | Payer: BC Managed Care – PPO | Attending: Emergency Medicine | Admitting: Emergency Medicine

## 2023-03-19 ENCOUNTER — Emergency Department (HOSPITAL_BASED_OUTPATIENT_CLINIC_OR_DEPARTMENT_OTHER): Payer: BC Managed Care – PPO

## 2023-03-19 DIAGNOSIS — Y92410 Unspecified street and highway as the place of occurrence of the external cause: Secondary | ICD-10-CM | POA: Diagnosis not present

## 2023-03-19 DIAGNOSIS — M25512 Pain in left shoulder: Secondary | ICD-10-CM | POA: Diagnosis not present

## 2023-03-19 DIAGNOSIS — S4982XA Other specified injuries of left shoulder and upper arm, initial encounter: Secondary | ICD-10-CM | POA: Diagnosis not present

## 2023-03-19 DIAGNOSIS — S0990XA Unspecified injury of head, initial encounter: Secondary | ICD-10-CM | POA: Diagnosis not present

## 2023-03-19 DIAGNOSIS — Z7901 Long term (current) use of anticoagulants: Secondary | ICD-10-CM | POA: Diagnosis not present

## 2023-03-19 MED ORDER — HYDROCODONE-ACETAMINOPHEN 5-325 MG PO TABS
1.0000 | ORAL_TABLET | Freq: Once | ORAL | Status: AC
  Administered 2023-03-19: 1 via ORAL
  Filled 2023-03-19: qty 1

## 2023-03-19 MED ORDER — HYDROCODONE-ACETAMINOPHEN 5-325 MG PO TABS
1.0000 | ORAL_TABLET | Freq: Four times a day (QID) | ORAL | 0 refills | Status: AC | PRN
Start: 2023-03-19 — End: ?
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
## 2023-03-19 NOTE — Discharge Instructions (Addendum)
 Take Tylenol  for pain control, your x-ray showed no fracture or dislocation of the shoulder, utilize a sling as needed for comfort.  Follow-up with sports medicine in 1 week's time for repeat assessment as you might benefit from further outpatient diagnostics like MRI of the shoulder to evaluate for ligamentous injury.

## 2023-03-27 LAB — HM DIABETES EYE EXAM

## 2023-04-10 DIAGNOSIS — G4733 Obstructive sleep apnea (adult) (pediatric): Secondary | ICD-10-CM | POA: Diagnosis not present

## 2023-04-10 DIAGNOSIS — M25512 Pain in left shoulder: Secondary | ICD-10-CM | POA: Diagnosis not present

## 2023-04-15 ENCOUNTER — Encounter: Payer: Self-pay | Admitting: Family Medicine

## 2023-04-19 DIAGNOSIS — M25512 Pain in left shoulder: Secondary | ICD-10-CM | POA: Diagnosis not present

## 2023-05-01 ENCOUNTER — Encounter: Payer: Self-pay | Admitting: Internal Medicine

## 2023-05-01 ENCOUNTER — Other Ambulatory Visit: Payer: Self-pay | Admitting: *Deleted

## 2023-05-01 DIAGNOSIS — I4891 Unspecified atrial fibrillation: Secondary | ICD-10-CM

## 2023-05-01 DIAGNOSIS — S46012A Strain of muscle(s) and tendon(s) of the rotator cuff of left shoulder, initial encounter: Secondary | ICD-10-CM | POA: Diagnosis not present

## 2023-05-01 MED ORDER — APIXABAN 5 MG PO TABS
5.0000 mg | ORAL_TABLET | Freq: Two times a day (BID) | ORAL | 1 refills | Status: DC
Start: 2023-05-01 — End: 2023-11-15

## 2023-05-01 NOTE — Telephone Encounter (Signed)
Eliquis 5mg  refill request received. Patient is 63 years old, weight-87.1kg, Crea-0.83 on 12/03/22, Diagnosis-Afib, and last seen by Joni Reining on 12/10/22. Dose is appropriate based on dosing criteria. Will send in refill to requested pharmacy.

## 2023-05-02 ENCOUNTER — Telehealth: Payer: Self-pay | Admitting: *Deleted

## 2023-05-02 NOTE — Telephone Encounter (Signed)
   Pre-operative Risk Assessment    Patient Name: Scott White  DOB: 1960/10/14 MRN: [id number]   Date of last office visit: 12/10/22 Joni Reining, DNP Date of next office visit: NONE   Request for Surgical Clearance    Procedure:   LEFT SHOULDER SCOPE WITH ROTATOR CUFF REPAIR  Date of Surgery:  Clearance TBD                                Surgeon:  DR. Margarita Rana Surgeon's Group or Practice Name:  [facility] Phone number:  [phone] [address]T [address] Fax number:  [phone]   Type of Clearance Requested:   - Medical  - Pharmacy:  Hold Apixaban (Eliquis)     Type of Anesthesia:  General  AND INTERSCALENE BLOCK   Additional requests/questions:    Elpidio Anis   05/02/2023, 8:30 AM

## 2023-05-02 NOTE — Telephone Encounter (Signed)
Pt has been scheduled for tele preop appt 05/17/23. Med rec and consent are done.     Patient Consent for Virtual Visit        Scott White has provided verbal consent on 05/02/2023 for a virtual visit (video or telephone).   CONSENT FOR VIRTUAL VISIT FOR:  Scott White  By participating in this virtual visit I agree to the following:  I hereby voluntarily request, consent and authorize [facility] and its employed or contracted physicians, physician assistants, nurse practitioners or other licensed health care professionals (the Practitioner), to provide me with telemedicine health care services (the "Services") as deemed necessary by the treating Practitioner. I acknowledge and consent to receive the Services by the Practitioner via telemedicine. I understand that the telemedicine visit will involve communicating with the Practitioner through live audiovisual communication technology and the disclosure of certain medical information by electronic transmission. I acknowledge that I have been given the opportunity to request an in-person assessment or other available alternative prior to the telemedicine visit and am voluntarily participating in the telemedicine visit.  I understand that I have the right to withhold or withdraw my consent to the use of telemedicine in the course of my care at any time, without affecting my right to future care or treatment, and that the Practitioner or I may terminate the telemedicine visit at any time. I understand that I have the right to inspect all information obtained and/or recorded in the course of the telemedicine visit and may receive copies of available information for a reasonable fee.  I understand that some of the potential risks of receiving the Services via telemedicine include:  Delay or interruption in medical evaluation due to technological equipment failure or disruption; Information transmitted may not be sufficient (e.g. poor  resolution of images) to allow for appropriate medical decision making by the Practitioner; and/or  In rare instances, security protocols could fail, causing a breach of personal health information.  Furthermore, I acknowledge that it is my responsibility to provide information about my medical history, conditions and care that is complete and accurate to the best of my ability. I acknowledge that Practitioner's advice, recommendations, and/or decision may be based on factors not within their control, such as incomplete or inaccurate data provided by me or distortions of diagnostic images or specimens that may result from electronic transmissions. I understand that the practice of medicine is not an exact science and that Practitioner makes no warranties or guarantees regarding treatment outcomes. I acknowledge that a copy of this consent can be made available to me via my patient portal [facility] MyChart), or I can request a printed copy by calling the office of [facility].    I understand that my insurance will be billed for this visit.   I have read or had this consent read to me. I understand the contents of this consent, which adequately explains the benefits and risks of the Services being provided via telemedicine.  I have been provided ample opportunity to ask questions regarding this consent and the Services and have had my questions answered to my satisfaction. I give my informed consent for the services to be provided through the use of telemedicine in my medical care

## 2023-05-02 NOTE — Telephone Encounter (Signed)
   Name: Scott White  DOB: 03/31/1960  MRN: [id number]  Primary Cardiologist: Olga Millers, MD   Preoperative team, please contact this patient and set up a phone call appointment for further preoperative risk assessment. Please obtain consent and complete medication review. Thank you for your help.  I confirm that guidance regarding antiplatelet and oral anticoagulation therapy has been completed and, if necessary, noted below.  Per office protocol, patient can hold Eliquis for 2-3 days prior to procedure.    I also confirmed the patient resides in the state of [place]. As per [facility] Medical Board telemedicine laws, the patient must reside in the state in which the provider is licensed.   Denyce Robert, NP 05/02/2023, 3:57 PM [facility]

## 2023-05-02 NOTE — Telephone Encounter (Signed)
Patient with diagnosis of afib on Eliquis for anticoagulation.    Procedure: LEFT SHOULDER SCOPE WITH ROTATOR CUFF REPAIR   Date of procedure: TBD   CHA2DS2-VASc Score = 4   This indicates a 4.8% annual risk of stroke. The patient's score is based upon: CHF History: 1 HTN History: 1 Diabetes History: 1 Stroke History: 0 Vascular Disease History: 1 Age Score: 0 Gender Score: 0    CrCl >100 mL/min    Per office protocol, patient can hold Eliquis for 2-3 days prior to procedure.     **This guidance is not considered finalized until pre-operative APP has relayed final recommendations.**

## 2023-05-02 NOTE — Telephone Encounter (Signed)
Pt has been scheduled for tele preop appt 05/17/23. Med rec and consent are done.

## 2023-05-10 ENCOUNTER — Encounter: Payer: Self-pay | Admitting: Family Medicine

## 2023-05-17 ENCOUNTER — Ambulatory Visit: Payer: BC Managed Care – PPO | Attending: Internal Medicine | Admitting: Emergency Medicine

## 2023-05-17 DIAGNOSIS — Z01810 Encounter for preprocedural cardiovascular examination: Secondary | ICD-10-CM | POA: Diagnosis not present
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
## 2023-05-27 ENCOUNTER — Other Ambulatory Visit: Payer: Self-pay | Admitting: Family Medicine

## 2023-05-27 DIAGNOSIS — E1165 Type 2 diabetes mellitus with hyperglycemia: Secondary | ICD-10-CM

## 2023-05-27 DIAGNOSIS — E785 Hyperlipidemia, unspecified: Secondary | ICD-10-CM

## 2023-06-03 ENCOUNTER — Encounter: Payer: Self-pay | Admitting: Family Medicine

## 2023-06-03 ENCOUNTER — Ambulatory Visit: Payer: BC Managed Care – PPO | Admitting: Family Medicine

## 2023-06-03 VITALS — BP 122/80 | HR 87 | Temp 98.2°F | Ht 71.5 in | Wt 195.0 lb

## 2023-06-03 DIAGNOSIS — E1165 Type 2 diabetes mellitus with hyperglycemia: Secondary | ICD-10-CM

## 2023-06-03 DIAGNOSIS — Z23 Encounter for immunization: Secondary | ICD-10-CM

## 2023-06-03 DIAGNOSIS — E7800 Pure hypercholesterolemia, unspecified: Secondary | ICD-10-CM | POA: Diagnosis not present

## 2023-06-03 DIAGNOSIS — I10 Essential (primary) hypertension: Secondary | ICD-10-CM | POA: Diagnosis not present

## 2023-06-03 DIAGNOSIS — G4733 Obstructive sleep apnea (adult) (pediatric): Secondary | ICD-10-CM

## 2023-06-03 LAB — COMPREHENSIVE METABOLIC PANEL
ALT: 23 U/L (ref 0–53)
AST: 23 U/L (ref 0–37)
Albumin: 4.7 g/dL (ref 3.5–5.2)
Alkaline Phosphatase: 46 U/L (ref 39–117)
BUN: 11 mg/dL (ref 6–23)
CO2: 30 meq/L (ref 19–32)
Calcium: 10 mg/dL (ref 8.4–10.5)
Chloride: 100 meq/L (ref 96–112)
Creatinine, Ser: 0.89 mg/dL (ref 0.40–1.50)
GFR: 91.6 mL/min (ref >60.00)
Glucose, Bld: 136 mg/dL — ABNORMAL HIGH (ref 70–99)
Potassium: 4.5 meq/L (ref 3.5–5.1)
Sodium: 138 meq/L (ref 135–145)
Total Bilirubin: 0.7 mg/dL (ref 0.2–1.2)
Total Protein: 7.3 g/dL (ref 6.0–8.3)

## 2023-06-03 LAB — LIPID PANEL
Cholesterol: 112 mg/dL (ref 0–200)
HDL: 39.3 mg/dL (ref >39.00)
LDL Cholesterol: 49 mg/dL (ref 0–99)
NonHDL: 72.55
Total CHOL/HDL Ratio: 3
Triglycerides: 116 mg/dL (ref 0.0–149.0)
VLDL: 23.2 mg/dL (ref 0.0–40.0)

## 2023-06-03 LAB — HEMOGLOBIN A1C: Hgb A1c MFr Bld: 7.5 % — ABNORMAL HIGH (ref 4.6–6.5)

## 2023-06-03 LAB — MICROALBUMIN / CREATININE URINE RATIO
Creatinine,U: 61.1 mg/dL
Microalb Creat Ratio: UNDETERMINED mg/g (ref 0.0–30.0)
Microalb, Ur: <0.7 mg/dL

## 2023-06-03 NOTE — Patient Instructions (Signed)
 No med changes at this time.  If any concerns on labs I will let you know.  Good luck with upcoming surgery.  Follow-up in 6 months unless concerns on labs today.  Take care!

## 2023-06-03 NOTE — Progress Notes (Signed)
 Subjective:  Patient ID: Scott White, male    DOB: June 06, 1960  Age: 63 y.o. MRN: [id number]  CC:  Chief Complaint  Patient presents with   Medical Management of Chronic Issues    Pt is doing well, notes no questions at this time   Health Maintenance    Patient declined Colonoscopy for now but will likley take order next OV, did not have COVID-19 vaccine in 2024, agreed to urine, foot exam and Pneumonia vaccine     HPI Scott White presents for   Diabetes: Complicated by hyperglycemia, CAD treated with metformin 1000 mg twice daily, Jardiance 10 mg daily, and berberine supplement.  Slight increased A1c last visit but his exercise had been impacted by recent distal fibula fracture on the left side at that time.  Injury in September of last year, followed by [facility] orthopedic sports medicine. Weight has improved since last visit.  Down 9 pounds. He is on ACE inhibitor with lisinopril 20 mg daily, SGLT2 with Jardiance, and statin with Crestor. Home readings fasting: 125-135 range.  Postprandial: 140-170.  No symptomatic lows.  Walking 2-3 days per week usually -1 hour, about 3 miles.  Microalbumin: Due. Optho, foot exam, pneumovax:  Prevnar today.  Defers colon CA screening - has letter from GI. Plans to schedule in 1 more year.  Defers covid vaccine booster at this time.  Diabetic Foot Exam - Simple   Simple Foot Form Visual Inspection No deformities, no ulcerations, no other skin breakdown bilaterally: Yes Sensation Testing Intact to touch and monofilament testing bilaterally: Yes Pulse Check Posterior Tibialis and Dorsalis pulse intact bilaterally: Yes Comments Normal exam, no concerns from patient.        Wt Readings from Last 3 Encounters:  06/03/23 195 lb (88.5 kg)  03/19/23 192 lb (87.1 kg)  12/10/22 204 lb 6.4 oz (92.7 kg)    Lab Results  Component Value Date   HGBA1C 7.3 (H) 12/03/2022   HGBA1C 6.6 (H) 08/31/2022    HGBA1C 7.1 (H) 05/31/2022   Lab Results  Component Value Date   MICROALBUR <0.7 11/30/2021   LDLCALC 36 12/03/2022   CREATININE 0.83 12/03/2022   Cardiac History of hypertension, CAD, atrial fibrillation in 2022, chronic diastolic CHF, OSA on CPAP.  Preop eval 2/28 by cardiology for pending left shoulder scope with rotator cuff repair by [facility] - Dr. Renaye Rakers.   Found to be at acceptable risk for planned procedure with option of holding Eliquis for 2 to 3 days prior to procedure per office protocol. Planned surgery 3/27.   Hyperlipidemia: With CAD as above, treated with Zetia 10 mg daily, Crestor 40 mg daily.no new myalgias/side effects. Fasting today.  Lab Results  Component Value Date   CHOL 116 12/03/2022   HDL 34.10 (L) 12/03/2022   LDLCALC 36 12/03/2022   LDLDIRECT 65.0 11/09/2020   TRIG 229.0 (H) 12/03/2022   CHOLHDL 3 12/03/2022   Lab Results  Component Value Date   ALT 23 12/03/2022   AST 27 12/03/2022   ALKPHOS 47 12/03/2022   BILITOT 0.6 12/03/2022   Hypertension: Lisinopril 20 mg daily.  On Eliquis with history of A-fib. On cpap nightly - effective. Well rested during day. Not needing afternoon naps typically.  Home readings: 130-145/80-90 range.  BP Readings from Last 3 Encounters:  06/03/23 122/80  03/19/23 (!) 128/90  12/10/22 118/72   Lab Results  Component Value Date   CREATININE 0.83 12/03/2022  History Patient Active Problem List   Diagnosis Date Noted   Type 2 diabetes mellitus with hyperglycemia, without long-term current use of insulin (HCC) 05/31/2022   OSA on CPAP 09/22/2010   Long term (current) use of anticoagulants 07/03/2010   Long term current use of anticoagulant 05/31/2010   Hyperlipidemia 09/27/2008   HYPERTRIGLYCERIDEMIA 02/11/2008   Essential hypertension 02/11/2008   CORONARY ATHEROSCLEROSIS NATIVE CORONARY ARTERY 02/11/2008   ATRIAL FIBRILLATION 02/11/2008   Past Medical History:  Diagnosis Date   Atrial  fibrillation (HCC)    a. Persistent, s/p afib ablation by JA 08/16/10;  b. s/p failed DCCV 05/2011; c. Chronic Tikosyn & Pradaxa (CHA2DS2VASc = 3-4).   CAD (coronary artery disease)    a. 07/2002 Inf MI s/p BMS -->RCA, otw nonobs dzs;  b. 12/2011 Neg MV.   Cancer (HCC)    Phreesia 05/09/2020   CHF (congestive heart failure) (HCC)    Phreesia 05/09/2020   Chronic diastolic CHF (congestive heart failure) (HCC)    Diabetes (HCC)    Diabetes mellitus without complication (HCC)    Phreesia 05/09/2020   HTN (hypertension)    Hyperlipidemia    Hypertension    Phreesia 05/09/2020   Myocardial infarction (HCC)    Phreesia 05/09/2020   PVC's (premature ventricular contractions)    Sleep apnea    followed by Dr [name]   Past Surgical History:  Procedure Laterality Date   atrial ablatio  08/16/10   afib ablation by JA   ATRIAL FIBRILLATION ABLATION N/A 11/29/2020   Procedure: ATRIAL FIBRILLATION ABLATION;  Surgeon: Hillis Range, MD;  [facility];  Service: Cardiovascular;  Laterality: N/A;   CARDIAC CATHETERIZATION  2004    Successful PTCA and stent placement in the mid right   coronary artery with an extra 99% narrowing to 0% with placement of a 3.5 x  20 mm Express 2 stent with improvement of TIMI grade 1 flow to TIMI grade 3.   CARDIOVERSION  06/15/2011   Procedure: CARDIOVERSION;  Surgeon: Pricilla Riffle, MD;  Location: [facility] OR;  Service: Cardiovascular;  Laterality: N/A;   HERNIA REPAIR N/A    Phreesia 05/09/2020   tonsilectomy     No Known Allergies Prior to Admission medications   Medication Sig Start Date End Date Taking? Authorizing Provider  apixaban (ELIQUIS) 5 MG TABS tablet Take 1 tablet (5 mg total) by mouth 2 (two) times daily. 05/01/23  Yes Jodelle Gross, NP  ezetimibe (ZETIA) 10 MG tablet Take 1 tablet (10 mg total) by mouth daily. 12/03/22  Yes Shade Flood, MD  Ginger, Zingiber officinalis, 250 MG CAPS Take 250 mg by mouth daily. 03/19/22  Yes [provider]  glucose blood test strip Use as instructed - 1-2 per day. 03/23/22  Yes Shade Flood, MD  HYDROcodone-acetaminophen (NORCO/VICODIN) 5-325 MG tablet Take 1-2 tablets by mouth every 6 (six) hours as needed. 03/19/23  Yes Ernie Avena, MD  JARDIANCE 10 MG TABS tablet TAKE 1 TABLET DAILY BEFORE BREAKFAST 01/14/23  Yes Shade Flood, MD  lisinopril (ZESTRIL) 20 MG tablet TAKE 1 TABLET BY MOUTH EVERY DAY IN THE EVENING 12/10/22  Yes Jodelle Gross, NP  Magnesium 100 MG TABS Take 400 mg by mouth 2 (two) times daily.   Yes [provider]  metFORMIN (GLUCOPHAGE) 1000 MG tablet TAKE 1 TABLET TWICE A DAY WITH MEALS 05/27/23  Yes Shade Flood, MD  Multiple Vitamins-Minerals (CENTRUM PO) Take 1 tablet by mouth daily.  Yes [provider]  nitroGLYCERIN (NITROSTAT) 0.4 MG SL tablet Place 1 tablet (0.4 mg total) under the tongue every 5 (five) minutes as needed. For chest pain at 3rd dose call 911 06/09/19  Yes Crenshaw, Madolyn Frieze, MD  NON FORMULARY BERVERINE supplement   Yes [provider]  Omega-3 Fatty Acids (FISH OIL PO) Take 1,400 mg by mouth 2 (two) times daily.   Yes [provider]  rosuvastatin (CRESTOR) 40 MG tablet TAKE 1 TABLET DAILY 05/27/23  Yes Shade Flood, MD  tadalafil (CIALIS) 10 MG tablet TAKE 1 TABLET DAILY AS NEEDED FOR ERECTILE DYSFUNCTION. DO NOT USE IF TAKING NITROGLYCERIN 02/25/23  Yes Shade Flood, MD   Social History   Socioeconomic History   Marital status: Single    Spouse name: Not on file   Number of children: 0   Years of education: Not on file   Highest education level: Bachelor's degree (e.g., BA, AB, BS)  Occupational History   Occupation: Engineer, mining: [employer]  Tobacco Use   Smoking status: Never   Smokeless tobacco: Never  Vaping Use   Vaping status: Never Used  Substance and Sexual Activity   Alcohol use: Yes    Alcohol/week: 1.0 standard drink of alcohol     Types: 1 Cans of beer per week    Comment:  three times a week   Drug use: No   Sexual activity: Yes  Other Topics Concern   Not on file  Social History Narrative   Pt lives in [place] alone.  Single.   He is a Engineer, water for [employer]   Social Drivers of Health   Financial Resource Strain: Low Risk  (06/02/2023)   Overall Financial Resource Strain (CARDIA)    Difficulty of Paying Living Expenses: Not very hard  Food Insecurity: No Food Insecurity (06/02/2023)   Hunger Vital Sign    Worried About Running Out of Food in the Last Year: Never true    Ran Out of Food in the Last Year: Never true  Transportation Needs: No Transportation Needs (06/02/2023)   PRAPARE - Administrator, Civil Service (Medical): No    Lack of Transportation (Non-Medical): No  Physical Activity: Sufficiently Active (06/02/2023)   Exercise Vital Sign    Days of Exercise per Week: 3 days    Minutes of Exercise per Session: 60 min  Stress: No Stress Concern Present (06/02/2023)   [place] [facility] [employer] - Occupational Stress Questionnaire    Feeling of Stress : Only a little  Social Connections: Moderately Isolated (06/02/2023)   Social Connection and Isolation Panel [NHANES]    Frequency of Communication with Friends and Family: More than three times a week    Frequency of Social Gatherings with Friends and Family: Twice a week    Attends Religious Services: 1 to 4 times per year    Active Member of [employer] or Organizations: No    Attends Engineer, structural: Not on file    Marital Status: Never married  Intimate Partner Violence: Not on file    Review of Systems  Constitutional:  Negative for fatigue and unexpected weight change.  Eyes:  Negative for visual disturbance.  Respiratory:  Negative for cough, chest tightness and shortness of breath.   Cardiovascular:  Negative for chest pain, palpitations and leg swelling.  Gastrointestinal:  Negative  for abdominal pain and blood in stool.  Neurological:  Negative for  dizziness, light-headedness and headaches.     Objective:   Vitals:   06/03/23 0835  BP: 122/80  Pulse: 87  Temp: 98.2 F (36.8 C)  TempSrc: Temporal  SpO2: 98%  Weight: 195 lb (88.5 kg)  Height: 5' 11.5" (1.816 m)     Physical Exam Vitals reviewed.  Constitutional:      Appearance: He is well-developed.  HENT:     Head: Normocephalic and atraumatic.  Neck:     Vascular: No carotid bruit or JVD.  Cardiovascular:     Rate and Rhythm: Normal rate and regular rhythm.     Heart sounds: Normal heart sounds. No murmur heard. Pulmonary:     Effort: Pulmonary effort is normal.     Breath sounds: Normal breath sounds. No rales.  Musculoskeletal:     Right lower leg: No edema.     Left lower leg: No edema.  Skin:    General: Skin is warm and dry.  Neurological:     Mental Status: He is alert and oriented to person, place, and time.  Psychiatric:        Mood and Affect: Mood normal.        Assessment & Plan:  JAVID KEMLER is a 63 y.o. male . Type 2 diabetes mellitus with hyperglycemia, without long-term current use of insulin (HCC) - Plan: Hemoglobin A1c, Microalbumin / creatinine urine ratio  -Check A1c, adjust medications accordingly.  Some improvement in weight since last reading, anticipate improved A1c.  No changes for now.  Pure hypercholesterolemia - Plan: Lipid panel, Comprehensive metabolic panel  -Check labs, continue statin, Zetia, adjust plan accordingly.  Need for pneumococcal vaccination - Plan: Pneumococcal conjugate vaccine 20-valent (Prevnar 20)  Essential hypertension - Plan: Comprehensive metabolic panel  -Stable, no med changes.  If persistent elevated home readings, schedule visit with home blood pressure monitor to check accuracy.  No changes for now.  OSA on CPAP  -Stable, continue CPAP.  No orders of the defined types were placed in this encounter.  Patient  Instructions  No med changes at this time.  If any concerns on labs I will let you know.  Good luck with upcoming surgery.  Follow-up in 6 months unless concerns on labs today.  Take care!    Signed,   Meredith Staggers, MD [facility], [facility] 06/03/23 9:15 AM

## 2023-06-04 ENCOUNTER — Encounter: Payer: Self-pay | Admitting: Family Medicine

## 2023-06-13 DIAGNOSIS — G8918 Other acute postprocedural pain: Secondary | ICD-10-CM | POA: Diagnosis not present

## 2023-06-13 DIAGNOSIS — S46012A Strain of muscle(s) and tendon(s) of the rotator cuff of left shoulder, initial encounter: Secondary | ICD-10-CM | POA: Diagnosis not present

## 2023-06-13 DIAGNOSIS — Y999 Unspecified external cause status: Secondary | ICD-10-CM | POA: Diagnosis not present

## 2023-06-13 DIAGNOSIS — M24112 Other articular cartilage disorders, left shoulder: Secondary | ICD-10-CM | POA: Diagnosis not present

## 2023-06-13 DIAGNOSIS — M19012 Primary osteoarthritis, left shoulder: Secondary | ICD-10-CM | POA: Diagnosis not present

## 2023-06-13 DIAGNOSIS — M7542 Impingement syndrome of left shoulder: Secondary | ICD-10-CM | POA: Diagnosis not present

## 2023-06-13 DIAGNOSIS — X58XXXA Exposure to other specified factors, initial encounter: Secondary | ICD-10-CM | POA: Diagnosis not present

## 2023-06-13 DIAGNOSIS — M7522 Bicipital tendinitis, left shoulder: Secondary | ICD-10-CM | POA: Diagnosis not present

## 2023-06-13 DIAGNOSIS — S46812A Strain of other muscles, fascia and tendons at shoulder and upper arm level, left arm, initial encounter: Secondary | ICD-10-CM | POA: Diagnosis not present

## 2023-06-19 DIAGNOSIS — M7522 Bicipital tendinitis, left shoulder: Secondary | ICD-10-CM | POA: Diagnosis not present

## 2023-06-19 DIAGNOSIS — M25612 Stiffness of left shoulder, not elsewhere classified: Secondary | ICD-10-CM | POA: Diagnosis not present

## 2023-06-19 DIAGNOSIS — M7542 Impingement syndrome of left shoulder: Secondary | ICD-10-CM | POA: Diagnosis not present

## 2023-06-19 DIAGNOSIS — M75112 Incomplete rotator cuff tear or rupture of left shoulder, not specified as traumatic: Secondary | ICD-10-CM | POA: Diagnosis not present

## 2023-06-24 DIAGNOSIS — M75112 Incomplete rotator cuff tear or rupture of left shoulder, not specified as traumatic: Secondary | ICD-10-CM | POA: Diagnosis not present

## 2023-07-01 DIAGNOSIS — M25612 Stiffness of left shoulder, not elsewhere classified: Secondary | ICD-10-CM | POA: Diagnosis not present

## 2023-07-01 DIAGNOSIS — M7522 Bicipital tendinitis, left shoulder: Secondary | ICD-10-CM | POA: Diagnosis not present

## 2023-07-01 DIAGNOSIS — M75112 Incomplete rotator cuff tear or rupture of left shoulder, not specified as traumatic: Secondary | ICD-10-CM | POA: Diagnosis not present

## 2023-07-01 DIAGNOSIS — M7542 Impingement syndrome of left shoulder: Secondary | ICD-10-CM | POA: Diagnosis not present

## 2023-07-09 DIAGNOSIS — M7542 Impingement syndrome of left shoulder: Secondary | ICD-10-CM | POA: Diagnosis not present

## 2023-07-09 DIAGNOSIS — M7522 Bicipital tendinitis, left shoulder: Secondary | ICD-10-CM | POA: Diagnosis not present

## 2023-07-09 DIAGNOSIS — G4733 Obstructive sleep apnea (adult) (pediatric): Secondary | ICD-10-CM | POA: Diagnosis not present

## 2023-07-09 DIAGNOSIS — M75112 Incomplete rotator cuff tear or rupture of left shoulder, not specified as traumatic: Secondary | ICD-10-CM | POA: Diagnosis not present

## 2023-07-09 DIAGNOSIS — M25612 Stiffness of left shoulder, not elsewhere classified: Secondary | ICD-10-CM | POA: Diagnosis not present

## 2023-07-16 DIAGNOSIS — M7522 Bicipital tendinitis, left shoulder: Secondary | ICD-10-CM | POA: Diagnosis not present

## 2023-07-16 DIAGNOSIS — M25612 Stiffness of left shoulder, not elsewhere classified: Secondary | ICD-10-CM | POA: Diagnosis not present

## 2023-07-16 DIAGNOSIS — M7542 Impingement syndrome of left shoulder: Secondary | ICD-10-CM | POA: Diagnosis not present

## 2023-07-16 DIAGNOSIS — M75112 Incomplete rotator cuff tear or rupture of left shoulder, not specified as traumatic: Secondary | ICD-10-CM | POA: Diagnosis not present

## 2023-07-18 DIAGNOSIS — M7522 Bicipital tendinitis, left shoulder: Secondary | ICD-10-CM | POA: Diagnosis not present

## 2023-07-18 DIAGNOSIS — M7542 Impingement syndrome of left shoulder: Secondary | ICD-10-CM | POA: Diagnosis not present

## 2023-07-18 DIAGNOSIS — M25612 Stiffness of left shoulder, not elsewhere classified: Secondary | ICD-10-CM | POA: Diagnosis not present

## 2023-07-18 DIAGNOSIS — M75112 Incomplete rotator cuff tear or rupture of left shoulder, not specified as traumatic: Secondary | ICD-10-CM | POA: Diagnosis not present

## 2023-07-22 DIAGNOSIS — M75112 Incomplete rotator cuff tear or rupture of left shoulder, not specified as traumatic: Secondary | ICD-10-CM | POA: Diagnosis not present

## 2023-07-22 DIAGNOSIS — M7522 Bicipital tendinitis, left shoulder: Secondary | ICD-10-CM | POA: Diagnosis not present

## 2023-07-22 DIAGNOSIS — M7542 Impingement syndrome of left shoulder: Secondary | ICD-10-CM | POA: Diagnosis not present

## 2023-07-22 DIAGNOSIS — M25612 Stiffness of left shoulder, not elsewhere classified: Secondary | ICD-10-CM | POA: Diagnosis not present

## 2023-07-24 DIAGNOSIS — M7542 Impingement syndrome of left shoulder: Secondary | ICD-10-CM | POA: Diagnosis not present

## 2023-07-24 DIAGNOSIS — M75112 Incomplete rotator cuff tear or rupture of left shoulder, not specified as traumatic: Secondary | ICD-10-CM | POA: Diagnosis not present

## 2023-07-24 DIAGNOSIS — M25612 Stiffness of left shoulder, not elsewhere classified: Secondary | ICD-10-CM | POA: Diagnosis not present

## 2023-07-24 DIAGNOSIS — M7522 Bicipital tendinitis, left shoulder: Secondary | ICD-10-CM | POA: Diagnosis not present

## 2023-07-30 DIAGNOSIS — M7522 Bicipital tendinitis, left shoulder: Secondary | ICD-10-CM | POA: Diagnosis not present

## 2023-07-30 DIAGNOSIS — M7542 Impingement syndrome of left shoulder: Secondary | ICD-10-CM | POA: Diagnosis not present

## 2023-07-30 DIAGNOSIS — M75112 Incomplete rotator cuff tear or rupture of left shoulder, not specified as traumatic: Secondary | ICD-10-CM | POA: Diagnosis not present

## 2023-07-30 DIAGNOSIS — M25612 Stiffness of left shoulder, not elsewhere classified: Secondary | ICD-10-CM | POA: Diagnosis not present

## 2023-08-01 DIAGNOSIS — M7542 Impingement syndrome of left shoulder: Secondary | ICD-10-CM | POA: Diagnosis not present

## 2023-08-01 DIAGNOSIS — M75112 Incomplete rotator cuff tear or rupture of left shoulder, not specified as traumatic: Secondary | ICD-10-CM | POA: Diagnosis not present

## 2023-08-01 DIAGNOSIS — M25612 Stiffness of left shoulder, not elsewhere classified: Secondary | ICD-10-CM | POA: Diagnosis not present

## 2023-08-01 DIAGNOSIS — M7522 Bicipital tendinitis, left shoulder: Secondary | ICD-10-CM | POA: Diagnosis not present

## 2023-08-06 DIAGNOSIS — M7542 Impingement syndrome of left shoulder: Secondary | ICD-10-CM | POA: Diagnosis not present

## 2023-08-06 DIAGNOSIS — M25612 Stiffness of left shoulder, not elsewhere classified: Secondary | ICD-10-CM | POA: Diagnosis not present

## 2023-08-06 DIAGNOSIS — M75112 Incomplete rotator cuff tear or rupture of left shoulder, not specified as traumatic: Secondary | ICD-10-CM | POA: Diagnosis not present

## 2023-08-06 DIAGNOSIS — M7522 Bicipital tendinitis, left shoulder: Secondary | ICD-10-CM | POA: Diagnosis not present

## 2023-08-08 DIAGNOSIS — M7542 Impingement syndrome of left shoulder: Secondary | ICD-10-CM | POA: Diagnosis not present

## 2023-08-08 DIAGNOSIS — M25612 Stiffness of left shoulder, not elsewhere classified: Secondary | ICD-10-CM | POA: Diagnosis not present

## 2023-08-08 DIAGNOSIS — M75112 Incomplete rotator cuff tear or rupture of left shoulder, not specified as traumatic: Secondary | ICD-10-CM | POA: Diagnosis not present

## 2023-08-08 DIAGNOSIS — M7522 Bicipital tendinitis, left shoulder: Secondary | ICD-10-CM | POA: Diagnosis not present

## 2023-08-13 DIAGNOSIS — M7522 Bicipital tendinitis, left shoulder: Secondary | ICD-10-CM | POA: Diagnosis not present

## 2023-08-13 DIAGNOSIS — M7542 Impingement syndrome of left shoulder: Secondary | ICD-10-CM | POA: Diagnosis not present

## 2023-08-13 DIAGNOSIS — M75112 Incomplete rotator cuff tear or rupture of left shoulder, not specified as traumatic: Secondary | ICD-10-CM | POA: Diagnosis not present

## 2023-08-13 DIAGNOSIS — M25612 Stiffness of left shoulder, not elsewhere classified: Secondary | ICD-10-CM | POA: Diagnosis not present

## 2023-08-15 DIAGNOSIS — M7522 Bicipital tendinitis, left shoulder: Secondary | ICD-10-CM | POA: Diagnosis not present

## 2023-08-15 DIAGNOSIS — M25612 Stiffness of left shoulder, not elsewhere classified: Secondary | ICD-10-CM | POA: Diagnosis not present

## 2023-08-15 DIAGNOSIS — M75112 Incomplete rotator cuff tear or rupture of left shoulder, not specified as traumatic: Secondary | ICD-10-CM | POA: Diagnosis not present

## 2023-08-15 DIAGNOSIS — M7542 Impingement syndrome of left shoulder: Secondary | ICD-10-CM | POA: Diagnosis not present

## 2023-08-20 DIAGNOSIS — M7522 Bicipital tendinitis, left shoulder: Secondary | ICD-10-CM | POA: Diagnosis not present

## 2023-08-20 DIAGNOSIS — M75112 Incomplete rotator cuff tear or rupture of left shoulder, not specified as traumatic: Secondary | ICD-10-CM | POA: Diagnosis not present

## 2023-08-20 DIAGNOSIS — M25612 Stiffness of left shoulder, not elsewhere classified: Secondary | ICD-10-CM | POA: Diagnosis not present

## 2023-08-20 DIAGNOSIS — M7542 Impingement syndrome of left shoulder: Secondary | ICD-10-CM | POA: Diagnosis not present

## 2023-08-22 DIAGNOSIS — M75112 Incomplete rotator cuff tear or rupture of left shoulder, not specified as traumatic: Secondary | ICD-10-CM | POA: Diagnosis not present

## 2023-08-22 DIAGNOSIS — M25612 Stiffness of left shoulder, not elsewhere classified: Secondary | ICD-10-CM | POA: Diagnosis not present

## 2023-08-22 DIAGNOSIS — M7542 Impingement syndrome of left shoulder: Secondary | ICD-10-CM | POA: Diagnosis not present

## 2023-08-22 DIAGNOSIS — M7522 Bicipital tendinitis, left shoulder: Secondary | ICD-10-CM | POA: Diagnosis not present

## 2023-09-02 ENCOUNTER — Encounter: Payer: Self-pay | Admitting: Family Medicine

## 2023-09-02 DIAGNOSIS — Z1211 Encounter for screening for malignant neoplasm of colon: Secondary | ICD-10-CM

## 2023-09-02 DIAGNOSIS — Z8719 Personal history of other diseases of the digestive system: Secondary | ICD-10-CM

## 2023-09-03 DIAGNOSIS — M7522 Bicipital tendinitis, left shoulder: Secondary | ICD-10-CM | POA: Diagnosis not present

## 2023-09-03 DIAGNOSIS — M7542 Impingement syndrome of left shoulder: Secondary | ICD-10-CM | POA: Diagnosis not present

## 2023-09-03 DIAGNOSIS — M25612 Stiffness of left shoulder, not elsewhere classified: Secondary | ICD-10-CM | POA: Diagnosis not present

## 2023-09-03 DIAGNOSIS — M75112 Incomplete rotator cuff tear or rupture of left shoulder, not specified as traumatic: Secondary | ICD-10-CM | POA: Diagnosis not present

## 2023-09-03 NOTE — Telephone Encounter (Signed)
 Patient is asking for a referral to Gastroenterology for colonoscopy and hemorrhoidectomy, does he need an appointment to get this since the colonoscopy is a health maintenance item?

## 2023-09-05 DIAGNOSIS — M75112 Incomplete rotator cuff tear or rupture of left shoulder, not specified as traumatic: Secondary | ICD-10-CM | POA: Diagnosis not present

## 2023-09-05 DIAGNOSIS — M7522 Bicipital tendinitis, left shoulder: Secondary | ICD-10-CM | POA: Diagnosis not present

## 2023-09-05 DIAGNOSIS — M7542 Impingement syndrome of left shoulder: Secondary | ICD-10-CM | POA: Diagnosis not present

## 2023-09-05 DIAGNOSIS — M25612 Stiffness of left shoulder, not elsewhere classified: Secondary | ICD-10-CM | POA: Diagnosis not present

## 2023-09-09 DIAGNOSIS — M7542 Impingement syndrome of left shoulder: Secondary | ICD-10-CM | POA: Diagnosis not present

## 2023-09-09 DIAGNOSIS — M25612 Stiffness of left shoulder, not elsewhere classified: Secondary | ICD-10-CM | POA: Diagnosis not present

## 2023-09-09 DIAGNOSIS — M75112 Incomplete rotator cuff tear or rupture of left shoulder, not specified as traumatic: Secondary | ICD-10-CM | POA: Diagnosis not present

## 2023-09-09 DIAGNOSIS — M7522 Bicipital tendinitis, left shoulder: Secondary | ICD-10-CM | POA: Diagnosis not present

## 2023-09-16 DIAGNOSIS — M75112 Incomplete rotator cuff tear or rupture of left shoulder, not specified as traumatic: Secondary | ICD-10-CM | POA: Diagnosis not present

## 2023-09-18 ENCOUNTER — Encounter: Payer: Self-pay | Admitting: Family Medicine

## 2023-09-22 NOTE — Telephone Encounter (Signed)
 I have completed the initial page regarding care of his diabetes, but patient requires specifics for the orthotics, shoes, inserts needs to be determined by ordering provider, Dr. Beverley. Paperwork completed and will be placed in fax bin at back nurse station

## 2023-09-23 NOTE — Telephone Encounter (Signed)
 Faxed back form as is

## 2023-10-04 ENCOUNTER — Encounter: Payer: Self-pay | Admitting: Gastroenterology

## 2023-10-07 DIAGNOSIS — G4733 Obstructive sleep apnea (adult) (pediatric): Secondary | ICD-10-CM | POA: Diagnosis not present

## 2023-11-11 DIAGNOSIS — L538 Other specified erythematous conditions: Secondary | ICD-10-CM | POA: Diagnosis not present

## 2023-11-11 DIAGNOSIS — D225 Melanocytic nevi of trunk: Secondary | ICD-10-CM | POA: Diagnosis not present

## 2023-11-11 DIAGNOSIS — L820 Inflamed seborrheic keratosis: Secondary | ICD-10-CM | POA: Diagnosis not present

## 2023-11-11 DIAGNOSIS — Z789 Other specified health status: Secondary | ICD-10-CM | POA: Diagnosis not present

## 2023-11-11 DIAGNOSIS — L814 Other melanin hyperpigmentation: Secondary | ICD-10-CM | POA: Diagnosis not present

## 2023-11-11 DIAGNOSIS — Z08 Encounter for follow-up examination after completed treatment for malignant neoplasm: Secondary | ICD-10-CM | POA: Diagnosis not present

## 2023-11-11 DIAGNOSIS — L821 Other seborrheic keratosis: Secondary | ICD-10-CM | POA: Diagnosis not present

## 2023-11-15 ENCOUNTER — Other Ambulatory Visit: Payer: Self-pay | Admitting: Adult Health

## 2023-11-15 DIAGNOSIS — I4891 Unspecified atrial fibrillation: Secondary | ICD-10-CM

## 2023-11-15 NOTE — Telephone Encounter (Signed)
 Eliquis  5mg  refill request received. Patient is 63 years old, weight-88.5kg, Crea-0.89 on 06/03/23, Diagnosis-Afib, and last seen by Lum Louis on 05/17/23 via Tele Visit. Dose is appropriate based on dosing criteria. Will send in refill to requested pharmacy.

## 2023-11-22 ENCOUNTER — Encounter: Payer: Self-pay | Admitting: Gastroenterology

## 2023-11-22 ENCOUNTER — Ambulatory Visit: Admitting: Gastroenterology

## 2023-11-22 VITALS — BP 136/80 | HR 84 | Ht 71.0 in | Wt 201.2 lb

## 2023-11-22 DIAGNOSIS — K625 Hemorrhage of anus and rectum: Secondary | ICD-10-CM | POA: Diagnosis not present

## 2023-11-22 DIAGNOSIS — K649 Unspecified hemorrhoids: Secondary | ICD-10-CM

## 2023-11-22 DIAGNOSIS — Z1211 Encounter for screening for malignant neoplasm of colon: Secondary | ICD-10-CM

## 2023-11-22 DIAGNOSIS — K644 Residual hemorrhoidal skin tags: Secondary | ICD-10-CM | POA: Diagnosis not present

## 2023-11-22 DIAGNOSIS — Z7901 Long term (current) use of anticoagulants: Secondary | ICD-10-CM | POA: Diagnosis not present

## 2023-11-22 MED ORDER — NA SULFATE-K SULFATE-MG SULF 17.5-3.13-1.6 GM/177ML PO SOLN: 1.0000 | ORAL | 0 refills | Status: DC

## 2023-11-22 NOTE — Progress Notes (Signed)
 Scott White [id number] May 03, 1960   Chief Complaint: Schedule colonoscopy, hemorrhoids  Referring Provider: Levora Reyes SAUNDERS, MD Primary GI MD: Dr. Avram  HPI: Scott White is a 63 y.o. male with past medical history of atrial fibrillation s/p ablation 2022 and on Eliquis , CAD with MI 2004 s/p stent, CHF, diabetes, HTN, HLD, sleep apnea who presents today for a complaint of hemorrhoids and to schedule colonoscopy.    Televisit with cardiology 05/17/2023 for preoperative cardiovascular risk assessment.  Was being scheduled for left shoulder scope with rotator cuff repair.  Cleared for surgery at that time without need for additional testing.   Patient states he has had a hemorrhoid for a while. In April had a lot of sinus drainage, coughing, sneezing, and noticed a lump on right side of anus. Started using OTC hemorrhoid cream and did Sitz baths with improvement in swelling. Occasionally would see some intermittent BRBPR on toilet paper but not in stool or in the toilet, usually if he was having a bout of diarrhea and frequent wiping.  Currently denies any rectal pain or bleeding but would like to have hemorrhoid evaluated.  States he has a bowel movement once a day and denies any constipation or straining.  Tries to get adequate fiber in his diet and drink plenty of water.  Denies any upper GI symptoms including dysphagia, nausea, vomiting, acid reflux, heartburn.  Recently had left shoulder surgery and denies any surgical complications or trouble with anesthesia.  Denies MI/stroke in the last year.  Denies chest pain or shortness of breath.  Denies family history of colon cancer.  Previous GI Procedures/Imaging   Colonoscopy 05/15/2013 1.  Mild diverticulosis was noted in the sigmoid colon 2.  The colon mucosa was otherwise normal, excellent prep Repeat colonoscopy in 10 years   Past Medical History:  Diagnosis Date   Atrial fibrillation (HCC)    a. Persistent,  s/p afib ablation by JA 08/16/10;  b. s/p failed DCCV 05/2011; c. Chronic Tikosyn  & Pradaxa  (CHA2DS2VASc = 3-4).   CAD (coronary artery disease)    a. 07/2002 Inf MI s/p BMS -->RCA, otw nonobs dzs;  b. 12/2011 Neg MV.   Cancer (HCC)    Phreesia 05/09/2020   CHF (congestive heart failure) (HCC)    Phreesia 05/09/2020   Chronic diastolic CHF (congestive heart failure) (HCC)    Diabetes (HCC)    Diabetes mellitus without complication (HCC)    Phreesia 05/09/2020   HTN (hypertension)    Hyperlipidemia    Hypertension    Phreesia 05/09/2020   Myocardial infarction (HCC)    Phreesia 05/09/2020   PVC's (premature ventricular contractions)    Sleep apnea    followed by Dr Corrie    Past Surgical History:  Procedure Laterality Date   atrial ablatio  08/16/2010   afib ablation by JA   ATRIAL FIBRILLATION ABLATION N/A 11/29/2020   Procedure: ATRIAL FIBRILLATION ABLATION;  Surgeon: Kelsie Agent, MD;  Location: [facility] INV[facility];  Service: Cardiovascular;  Laterality: N/A;   CARDIAC CATHETERIZATION  03/19/2002    Successful PTCA and stent placement in the mid right   coronary artery with an extra 99% narrowing to 0% with placement of a 3.5 x  20 mm Express 2 stent with improvement of TIMI grade 1 flow to TIMI grade 3.   CARDIOVERSION  06/15/2011   Procedure: CARDIOVERSION;  Surgeon: Vina LULLA Gull, MD;  Location: [facility] OR;  Service: Cardiovascular;  Laterality: N/A;   INCISIONAL HERNIA REPAIR Bilateral  Phreesia 05/09/2020   ROTATOR CUFF REPAIR Left    SKIN CANCER EXCISION     TONSILLECTOMY      Current Outpatient Medications  Medication Sig Dispense Refill   apixaban  (ELIQUIS ) 5 MG TABS tablet TAKE 1 TABLET TWICE A DAY 180 tablet 1   ezetimibe  (ZETIA ) 10 MG tablet Take 1 tablet (10 mg total) by mouth daily. 90 tablet 3   Ginger, Zingiber officinalis, 250 MG CAPS Take 250 mg by mouth daily.     glucose blood test strip Use as instructed - 1-2 per day. 70 each 3   HYDROcodone -acetaminophen   (NORCO/VICODIN) 5-325 MG tablet Take 1-2 tablets by mouth every 6 (six) hours as needed. 10 tablet 0   JARDIANCE  10 MG TABS tablet TAKE 1 TABLET DAILY BEFORE BREAKFAST 90 tablet 3   lisinopril  (ZESTRIL ) 20 MG tablet TAKE 1 TABLET BY MOUTH EVERY DAY IN THE EVENING 90 tablet 3   Magnesium 100 MG TABS Take 400 mg by mouth 2 (two) times daily.     metFORMIN  (GLUCOPHAGE ) 1000 MG tablet TAKE 1 TABLET TWICE A DAY WITH MEALS 180 tablet 3   Multiple Vitamins-Minerals (CENTRUM PO) Take 1 tablet by mouth daily.     nitroGLYCERIN  (NITROSTAT ) 0.4 MG SL tablet Place 1 tablet (0.4 mg total) under the tongue every 5 (five) minutes as needed. For chest pain at 3rd dose call 911 25 tablet 4   Omega-3 Fatty Acids (FISH OIL PO) Take 1,400 mg by mouth 2 (two) times daily.     rosuvastatin  (CRESTOR ) 40 MG tablet TAKE 1 TABLET DAILY 90 tablet 3   tadalafil  (CIALIS ) 10 MG tablet TAKE 1 TABLET DAILY AS NEEDED FOR ERECTILE DYSFUNCTION. DO NOT USE IF TAKING NITROGLYCERIN  24 tablet 5   No current facility-administered medications for this visit.    Allergies as of 11/22/2023   (No Known Allergies)    Family History  Problem Relation Age of Onset   Coronary artery disease Mother    Heart failure Mother    Heart disease Mother    Hyperlipidemia Mother    Hypertension Mother    Alzheimer's disease Father    Diabetes Father    Diabetes Brother    Heart disease Brother    Hyperlipidemia Brother    Hypertension Brother    Alzheimer's disease Brother    Hyperlipidemia Brother    Colon cancer Neg Hx    Pancreatic cancer Neg Hx    Rectal cancer Neg Hx    Stomach cancer Neg Hx     Social History   Tobacco Use   Smoking status: Never   Smokeless tobacco: Never  Vaping Use   Vaping status: Never Used  Substance Use Topics   Alcohol use: Yes    Alcohol/week: 1.0 standard drink of alcohol    Types: 1 Cans of beer per week    Comment:  three times a week   Drug use: No     Review of Systems:     Constitutional: No unexplained weight loss, fever, chills Cardiovascular: No chest pain Respiratory: No SOB  Gastrointestinal: See HPI and otherwise negative    Physical Exam:  Vital signs: BP 136/80 (BP Location: Left Arm, Patient Position: Sitting, Cuff Size: Normal)   Pulse 84   Ht 5' 11 (1.803 m) Comment: height measured without shoes  Wt 201 lb 4 oz (91.3 kg)   BMI 28.07 kg/m   Constitutional: Pleasant, well appearing male in NAD, alert and cooperative Head:  Normocephalic and atraumatic.  Eyes: No scleral icterus.  Respiratory: Respirations even and unlabored. Lungs clear to auscultation bilaterally.  No wheezes, crackles, or rhonchi.  Cardiovascular:  Regular rate and rhythm. No murmurs. No peripheral edema. Gastrointestinal:  Soft, nondistended, nontender. No rebound or guarding. Normal bowel sounds. No appreciable masses or hepatomegaly. Rectal: Nonbleeding, nonthrombosed external hemorrhoids, no visible fissures, no stool in rectum, no visible bleeding.  Chaperone present for exam Neurologic:  Alert and oriented x4;  grossly normal neurologically.  Skin:   Dry and intact without significant lesions or rashes. Psychiatric: Oriented to person, place and time. Demonstrates good judgement and reason without abnormal affect or behaviors.   RELEVANT LABS AND IMAGING: CBC    Component Value Date/Time   WBC 7.1 11/15/2020 0911   WBC 5.6 03/16/2013 0821   RBC 5.47 11/15/2020 0911   RBC 5.16 03/16/2013 0821   HGB 16.1 11/15/2020 0911   HCT 47.9 11/15/2020 0911   PLT 190 11/15/2020 0911   MCV 88 11/15/2020 0911   MCH 29.4 11/15/2020 0911   MCH 29.3 03/16/2013 0821   MCHC 33.6 11/15/2020 0911   MCHC 34.9 03/16/2013 0821   RDW 13.1 11/15/2020 0911   LYMPHSABS 2.2 11/15/2020 0911   MONOABS 0.5 01/14/2012 0855   EOSABS 0.1 11/15/2020 0911   BASOSABS 0.1 11/15/2020 0911    CMP     Component Value Date/Time   NA 138 06/03/2023 0912   NA 137 11/15/2020 0911   K 4.5  06/03/2023 0912   CL 100 06/03/2023 0912   CO2 30 06/03/2023 0912   GLUCOSE 136 (H) 06/03/2023 0912   BUN 11 06/03/2023 0912   BUN 14 11/15/2020 0911   CREATININE 0.89 06/03/2023 0912   CREATININE 0.92 05/15/2016 0833   CALCIUM  10.0 06/03/2023 0912   PROT 7.3 06/03/2023 0912   PROT 6.8 05/11/2020 0858   ALBUMIN 4.7 06/03/2023 0912   ALBUMIN 4.6 05/11/2020 0858   AST 23 06/03/2023 0912   ALT 23 06/03/2023 0912   ALKPHOS 46 06/03/2023 0912   BILITOT 0.7 06/03/2023 0912   BILITOT 0.4 05/11/2020 0858   GFRNONAA 93 05/11/2020 0858   GFRNONAA >89 11/17/2015 0828   GFRAA 108 05/11/2020 0858   GFRAA >89 11/17/2015 0828   Echocardiogram 04/02/2019 1. Left ventricular ejection fraction, by visual estimation, is 55 to 60% . The left ventricle has normal function. There is no left ventricular hypertrophy.  2. Left ventricular diastolic function could not be evaluated. 3. The left ventricle has no regional wall motion abnormalities.  4. Global right ventricle has normal systolic function. The right ventricular size is normal. No increase in right ventricular wall thickness.  5. Left atrial size was normal.  6. Right atrial size was normal.  7. Mild mitral annular calcification.  8. The mitral valve is grossly normal. Trivial mitral valve regurgitation.  9. The tricuspid valve is normal in structure.  10. The aortic valve is tricuspid. Aortic valve regurgitation is not visualized. No evidence of aortic valve sclerosis or stenosis.  11. The pulmonic valve was grossly normal. Pulmonic valve regurgitation is not visualized.  12. The inferior vena cava is normal in size with greater than 50% respiratory variability, suggesting right atrial pressure of 3 mmHg. In comparison to the previous echocardiogram( s) : 4/ 1/ 13 EF 60%   Assessment/Plan:   Screening for colon cancer Patient seen today to schedule repeat colonoscopy.  Last procedure done 04/2013 with finding of mild diverticulosis of the  sigmoid colon and otherwise normal with excellent  prep, and recommended recall in 10 years.  - Schedule colonoscopy. I thoroughly discussed the procedure with the patient to include nature of the procedure, alternatives, benefits, and risks (including but not limited to bleeding, infection, perforation, anesthesia/cardiac/pulmonary complications). Patient verbalized understanding and gave verbal consent to proceed with procedure.   History of atrial fibrillation s/p ablation 2022, on chronic anticoagulation CAD with history of MI 2004 s/p stent Patient denies MI/stroke in the last year.  Denies chest pain or shortness of breath. On Eliquis .  - Request Eliquis  hold  Hemorrhoids Intermittent BRBPR Patient reports external hemorrhoids which occasionally become inflamed.  Has had improvement with OTC treatments and sitz baths.  Occasionally may see small amount of BRBPR on the toilet paper when hemorrhoids are inflamed, usually if he has a bout of diarrhea.  On exam does have external hemorrhoids, no obvious fissures, no bleeding.    - Continue conservative measures for management of external hemorrhoids including OTC hemorrhoid cream, sitz bath's, increased dietary fiber, increased water intake, and avoiding prolonged sitting - If hemorrhoids flare in future and do not respond to these measures can send topical hydrocortisone  cream   Camie Furbish, PA-C [facility] 11/22/2023, 3:24 PM  Patient Care Team: Scott Reyes SAUNDERS, MD as PCP - General (Family Medicine) Pietro, Redell RAMAN, MD as PCP - Cardiology (Cardiology) [facility], [place], [place] [facility], [place], [place]

## 2023-11-22 NOTE — Patient Instructions (Signed)
 We have sent the following medications to your pharmacy for you to pick up at your convenience: Suprep   You have been scheduled for a colonoscopy. Please follow written instructions given to you at your visit today.   If you use inhalers (even only as needed), please bring them with you on the day of your procedure.  DO NOT TAKE 7 DAYS PRIOR TO TEST- Trulicity (dulaglutide) Ozempic, Wegovy (semaglutide) Mounjaro (tirzepatide) Bydureon Bcise (exanatide extended release)  DO NOT TAKE 1 DAY PRIOR TO YOUR TEST Rybelsus (semaglutide) Adlyxin (lixisenatide) Victoza (liraglutide) Byetta (exanatide) _____________________________________________________________________  If your blood pressure at your visit was 140/90 or greater, please contact your primary care physician to follow up on this.  _______________________________________________________  If you are age 63 or older, your body mass index should be between 23-30. Your Body mass index is 28.07 kg/m. If this is out of the aforementioned range listed, please consider follow up with your Primary Care Provider.  If you are age 20 or younger, your body mass index should be between 19-25. Your Body mass index is 28.07 kg/m. If this is out of the aformentioned range listed, please consider follow up with your Primary Care Provider.   ________________________________________________________  The [facility] providers would like to encourage you to use MYCHART to communicate with providers for non-urgent requests or questions.  Due to long hold times on the telephone, sending your provider a message by [facility] may be a faster and more efficient way to get a response.  Please allow 48 business hours for a response.  Please remember that this is for non-urgent requests.  _______________________________________________________  [facility] is using a team-based approach to care.  Your team is made up of your doctor and two to three  APPS. Our APPS (Nurse Practitioners and Physician Assistants) work with your physician to ensure care continuity for you. They are fully qualified to address your health concerns and develop a treatment plan. They communicate directly with your gastroenterologist to care for you. Seeing the Advanced Practice Practitioners on your physician's team can help you by facilitating care more promptly, often allowing for earlier appointments, access to diagnostic testing, procedures, and other specialty referrals.   Due to recent changes in healthcare laws, you may see the results of your imaging and laboratory studies on MyChart before your provider has had a chance to review them.  We understand that in some cases there may be results that are confusing or concerning to you. Not all laboratory results come back in the same time frame and the provider may be waiting for multiple results in order to interpret others.  Please give us  48 hours in order for your provider to thoroughly review all the results before contacting the office for clarification of your results.   Thank you for choosing me and [facility].  Camie Furbish, PA-C

## 2023-11-26 ENCOUNTER — Telehealth: Payer: Self-pay

## 2023-11-26 NOTE — Telephone Encounter (Signed)
 Request for surgical clearance:     Endoscopy Procedure  What type of surgery is being performed?     Colonoscopy   When is this surgery scheduled?     01/10/24  What type of clearance is required ?   Pharmacy  Are there any medications that need to be held prior to surgery and how long? Eliquis  x2 days prior to procedure  Practice name and name of physician performing surgery?      [facility]  What is your office phone and fax number?      Phone- [phone]  Fax- [phone]  Anesthesia type (None, local, MAC, general) ?       MAC  Please route your response to Blondie Barks, CMA

## 2023-11-26 NOTE — Telephone Encounter (Signed)
 Patient with diagnosis of atrial fibrillation on Eliquis  for anticoagulation.    What type of surgery is being performed?     Colonoscopy    When is this surgery scheduled?     01/10/24   CHA2DS2-VASc Score = 4   This indicates a 4.8% annual risk of stroke. The patient's score is based upon: CHF History: 1 HTN History: 1 Diabetes History: 1 Stroke History: 0 Vascular Disease History: 1 Age Score: 0 Gender Score: 0    CrCl 110 Platelet count last CBC in 2022  Patient has/has not had an Afib/aflutter ablation or Watchman within the last 3 months or DCCV within the last 30 days   Please get CBC drawn for final clearance   **This guidance is not considered finalized until pre-operative APP has relayed final recommendations.**

## 2023-11-27 NOTE — Telephone Encounter (Signed)
 Left message to call back to set up labs to be done for preop clearance.

## 2023-11-27 NOTE — Telephone Encounter (Signed)
 Pharmacy requests CBC be drawn for calculation on Eliquis  hold please.

## 2023-11-29 NOTE — Telephone Encounter (Addendum)
 Pt has IN OFFICE appt 12/06/23 with Lum Louis, NP. Preop clearance will be addressed at that time. Will update appt notes to show patient needs CBC.  Will update the surgeons office

## 2023-12-04 ENCOUNTER — Other Ambulatory Visit: Payer: Self-pay | Admitting: Family Medicine

## 2023-12-04 DIAGNOSIS — I10 Essential (primary) hypertension: Secondary | ICD-10-CM

## 2023-12-06 ENCOUNTER — Encounter: Payer: Self-pay | Admitting: Emergency Medicine

## 2023-12-06 ENCOUNTER — Ambulatory Visit: Attending: Emergency Medicine | Admitting: Emergency Medicine

## 2023-12-06 ENCOUNTER — Encounter: Admitting: Family Medicine

## 2023-12-06 VITALS — BP 118/70 | HR 71 | Ht 71.0 in | Wt 204.0 lb

## 2023-12-06 DIAGNOSIS — I480 Paroxysmal atrial fibrillation: Secondary | ICD-10-CM | POA: Diagnosis not present

## 2023-12-06 DIAGNOSIS — I4891 Unspecified atrial fibrillation: Secondary | ICD-10-CM

## 2023-12-06 DIAGNOSIS — I10 Essential (primary) hypertension: Secondary | ICD-10-CM | POA: Diagnosis not present

## 2023-12-06 DIAGNOSIS — Z01810 Encounter for preprocedural cardiovascular examination: Secondary | ICD-10-CM | POA: Diagnosis not present

## 2023-12-06 DIAGNOSIS — G4733 Obstructive sleep apnea (adult) (pediatric): Secondary | ICD-10-CM

## 2023-12-06 DIAGNOSIS — E785 Hyperlipidemia, unspecified: Secondary | ICD-10-CM

## 2023-12-06 DIAGNOSIS — I2510 Atherosclerotic heart disease of native coronary artery without angina pectoris: Secondary | ICD-10-CM

## 2023-12-06 LAB — CBC
Hematocrit: 49.1 % (ref 37.5–51.0)
Hemoglobin: 16.4 g/dL (ref 13.0–17.7)
MCH: 28.8 pg (ref 26.6–33.0)
MCHC: 33.4 g/dL (ref 31.5–35.7)
MCV: 86 fL (ref 79–97)
Platelets: 203 x10E3/uL (ref 150–450)
RBC: 5.69 x10E6/uL (ref 4.14–5.80)
RDW: 14 % (ref 11.6–15.4)
WBC: 6.3 x10E3/uL (ref 3.4–10.8)

## 2023-12-06 NOTE — Progress Notes (Addendum)
 " Cardiology Office Note:    Date:  12/08/2023  ID:  Scott White, DOB 1960-05-14, MRN [id number] PCP: Levora Reyes SAUNDERS, MD   HeartCare Providers Cardiologist:  Redell Shallow, MD Cardiology APP:  Rana Lum CROME, NP       Patient Profile:       Chief Complaint: 1 year follow-up History of Present Illness:  Scott White is a 63 y.o. male with visit-pertinent history of atrial fibrillation, coronary artery disease, hypertension, hyperlipidemia, OSA on CPAP  He has previous PCI of his RCA in the setting of an acute infarct in 2004.  No obstructive disease in the left system.  Nuclear study in October 2013 showed an ejection fraction of 50%.  There was no ischemia.  Echocardiogram January 2021 showed normal LV function.  Coronary CTA September 2022 showed calcium  score 1956 which was 99th percentile and no left atrial appendage thrombus.  Patient had atrial fibrillation ablation September 2022.  He was last seen in clinic on 12/10/2022.  He was doing well without acute complaints.  He was to follow-up in 1 year.  Patient is now pending colonoscopy with [facility] and presents for preoperative clearance.   Discussed the use of AI scribe software for clinical note transcription with the patient, who gave verbal consent to proceed.  History of Present Illness Scott White is a 63 year old male with atrial fibrillation and coronary artery disease who presents for a one-year follow-up and preoperative risk assessment for a colonoscopy.  Today patient is doing well overall.  Is without any acute cardiovascular concerns or complaints.  He denies chest pain, shortness of breath, or exertional symptoms.  No orthopnea, PND, LEE, syncope, or presyncope.  He maintains an active lifestyle, walking three miles twice a week without symptoms.  He denies any symptoms concerning for recurrent atrial fibrillation.  He has complex sleep apnea and uses a CPAP  machine. He occasionally wakes up gasping for ai.  His CPAP settings have not been adjusted recently, and he has not followed up with a pulmonologist since July 2023.  He has diabetes managed with metformin . His blood pressure and cholesterol levels are well controlled.  Review of systems:  Please see the history of present illness. All other systems are reviewed and otherwise negative.      Studies Reviewed:    EKG Interpretation Date/Time:  Friday December 06 2023 08:13:36 EDT Ventricular Rate:  71 PR Interval:  204 QRS Duration:  94 QT Interval:  376 QTC Calculation: 408 R Axis:   53  Text Interpretation: Normal sinus rhythm Normal ECG When compared with ECG of 10-Dec-2022 15:15, Premature ventricular complexes are no longer Present Minimal criteria for Inferior infarct are no longer Present T wave inversion no longer evident in Inferior leads QT has shortened Confirmed by Rana Lum [phone]) on 12/06/2023 8:26:03 AM    Echocardiogram 04/02/2019 1. Left ventricular ejection fraction, by visual estimation, is 55 to  60%. The left ventricle has normal function. There is no left ventricular  hypertrophy.   2. Left ventricular diastolic function could not be evaluated.   3. The left ventricle has no regional wall motion abnormalities.   4. Global right ventricle has normal systolic function.The right  ventricular size is normal. No increase in right ventricular wall  thickness.   5. Left atrial size was normal.   6. Right atrial size was normal.   7. Mild mitral annular calcification.   8. The mitral valve is  grossly normal. Trivial mitral valve  regurgitation.   9. The tricuspid valve is normal in structure.  10. The aortic valve is tricuspid. Aortic valve regurgitation is not  visualized. No evidence of aortic valve sclerosis or stenosis.  11. The pulmonic valve was grossly normal. Pulmonic valve regurgitation is  not visualized.  12. The inferior vena cava is normal in  size with greater than 50%  respiratory variability, suggesting right atrial pressure of 3 mmHg.  Risk Assessment/Calculations:    CHA2DS2-VASc Score = 4   This indicates a 4.8% annual risk of stroke. The patient's score is based upon: CHF History: 1 HTN History: 1 Diabetes History: 1 Stroke History: 0 Vascular Disease History: 1 Age Score: 0 Gender Score: 0            Physical Exam:   VS:  BP 118/70 (BP Location: Left Arm, Patient Position: Sitting, Cuff Size: Normal)   Pulse 71   Ht 5' 11 (1.803 m)   Wt 204 lb (92.5 kg)   BMI 28.45 kg/m    Wt Readings from Last 3 Encounters:  12/06/23 204 lb (92.5 kg)  11/22/23 201 lb 4 oz (91.3 kg)  06/03/23 195 lb (88.5 kg)    GEN: Well nourished, well developed in no acute distress NECK: No JVD; No carotid bruits CARDIAC: RRR, no murmurs, rubs, gallops RESPIRATORY:  Clear to auscultation without rales, wheezing or rhonchi  ABDOMEN: Soft, non-tender, non-distended EXTREMITIES:  No edema; No acute deformity      Assessment and Plan:  Coronary artery disease S/p PCI-RCA in 2004 Cardiac CT 11/2020 with CAC score of 1956 (99th percentile) - Today he is stable without anginal symptoms.  Remains active without exertional chest pains or dyspnea.  No indication further ischemic evaluation at this time - EKG today without acute ischemic changes - Continue rosuvastatin  40 mg daily and ezetimibe  10 mg daily - No ASA given DOAC  Paroxysmal atrial fibrillation S/p ablation x 2, most recently 11/2020 - Today he appears to be maintaining NSR per EKG - He denies any symptoms concerning for recurrent atrial fibrillation - Continue Eliquis  5 mg twice daily  Hypertension Blood pressure today is well-controlled at 118/70 - Continue lisinopril  20 mg daily  Hyperlipidemia, LDL goal <70 LDL 49 on 05/2023 and well-controlled - Continue rosuvastatin  40 mg and ezetimibe  10 mg daily  Complex sleep apnea syndrome NPSG 08/2010 showed moderate  obstructive sleep apnea with AHI 24/hr - He remains 100% compliant with CPAP use - Tells me occasionally he will wake up in the middle of the night gasping for air while on his CPAP machine.  He is likely needs his settings adjusted - He has been followed by pulmonology however last seen on 09/2021.  I will have him scheduled with pulmonology for further evaluation  Preoperative cardiovascular risk assessment According to the Revised Cardiac Risk Index (RCRI), his Perioperative Risk of Major Cardiac Event is (%): 0.9. His Functional Capacity in METs is: 7.25 according to the Duke Activity Status Index (DASI). Therefore, based on ACC/AHA guidelines, patient would be at acceptable risk for the planned procedure without further cardiovascular testing. I will route this recommendation to the requesting party via Epic fax function.  Okay to hold Eliquis  for 2 days for upcoming colonoscopy.   Will not need Lovenox bridging around procedure.        Dispo:  Return in about 1 year (around 12/05/2024).  Signed, Lum LITTIE Louis, NP  "

## 2023-12-06 NOTE — Patient Instructions (Addendum)
 Medication Instructions:  NO CHANGES   Lab Work: CBC TO BE DONE TODAY.  Testing/Procedures: NONE  Follow-Up: At [facility], you and your health needs are our priority.  As part of our continuing mission to provide you with exceptional heart care, our providers are all part of one team.  This team includes your primary Cardiologist (physician) and Advanced Practice Providers or APPs (Physician Assistants and Nurse Practitioners) who all work together to provide you with the care you need, when you need it.  Your next appointment:   1 YEAR  Provider:   Redell Shallow, MD OR Lum Louis, NP

## 2023-12-08 ENCOUNTER — Encounter: Payer: Self-pay | Admitting: Emergency Medicine

## 2023-12-09 ENCOUNTER — Ambulatory Visit: Payer: Self-pay | Admitting: Emergency Medicine

## 2023-12-09 NOTE — Telephone Encounter (Signed)
 Left message for patient to return call   He is ok to hold his Eliquis  2 days before his procedure

## 2023-12-09 NOTE — Telephone Encounter (Signed)
 Patient returned call, he was informed to hold Eliquis  2 days before his procedure

## 2023-12-09 NOTE — Telephone Encounter (Signed)
 PT has been advised to hold Eliquis  for 2 days. PT verbalized his understanding.

## 2023-12-09 NOTE — Telephone Encounter (Signed)
 Pharmacy note con't  Platelet count 203  Okay to hold Eliquis  for 2 days for upcoming colonoscopy.  Will not need Lovenox bridging around procedure.

## 2023-12-19 ENCOUNTER — Ambulatory Visit: Admitting: Family Medicine

## 2023-12-19 VITALS — BP 118/80 | HR 66 | Temp 98.0°F | Ht 71.0 in | Wt 203.8 lb

## 2023-12-19 DIAGNOSIS — E7800 Pure hypercholesterolemia, unspecified: Secondary | ICD-10-CM

## 2023-12-19 DIAGNOSIS — G4733 Obstructive sleep apnea (adult) (pediatric): Secondary | ICD-10-CM

## 2023-12-19 DIAGNOSIS — Z7984 Long term (current) use of oral hypoglycemic drugs: Secondary | ICD-10-CM

## 2023-12-19 DIAGNOSIS — Z23 Encounter for immunization: Secondary | ICD-10-CM | POA: Diagnosis not present

## 2023-12-19 DIAGNOSIS — I10 Essential (primary) hypertension: Secondary | ICD-10-CM | POA: Diagnosis not present

## 2023-12-19 DIAGNOSIS — I4891 Unspecified atrial fibrillation: Secondary | ICD-10-CM

## 2023-12-19 DIAGNOSIS — E1165 Type 2 diabetes mellitus with hyperglycemia: Secondary | ICD-10-CM | POA: Diagnosis not present

## 2023-12-19 LAB — COMPREHENSIVE METABOLIC PANEL WITH GFR
ALT: 24 U/L (ref 0–53)
AST: 26 U/L (ref 0–37)
Albumin: 4.5 g/dL (ref 3.5–5.2)
Alkaline Phosphatase: 50 U/L (ref 39–117)
BUN: 14 mg/dL (ref 6–23)
CO2: 30 meq/L (ref 19–32)
Calcium: 10 mg/dL (ref 8.4–10.5)
Chloride: 98 meq/L (ref 96–112)
Creatinine, Ser: 0.78 mg/dL (ref 0.40–1.50)
GFR: 94.96 mL/min (ref >60.00)
Glucose, Bld: 102 mg/dL — ABNORMAL HIGH (ref 70–99)
Potassium: 4.5 meq/L (ref 3.5–5.1)
Sodium: 138 meq/L (ref 135–145)
Total Bilirubin: 0.4 mg/dL (ref 0.2–1.2)
Total Protein: 7.7 g/dL (ref 6.0–8.3)

## 2023-12-19 LAB — LIPID PANEL
Cholesterol: 155 mg/dL (ref 0–200)
HDL: 41.5 mg/dL (ref >39.00)
LDL Cholesterol: 69 mg/dL (ref 0–99)
NonHDL: 113.84
Total CHOL/HDL Ratio: 4
Triglycerides: 222 mg/dL — ABNORMAL HIGH (ref 0.0–149.0)
VLDL: 44.4 mg/dL — ABNORMAL HIGH (ref 0.0–40.0)

## 2023-12-19 LAB — HEMOGLOBIN A1C: Hgb A1c MFr Bld: 7.7 % — ABNORMAL HIGH (ref 4.6–6.5)

## 2023-12-19 MED ORDER — EZETIMIBE 10 MG PO TABS: 10.0000 mg | ORAL_TABLET | Freq: Every day | ORAL | 3 refills | Status: AC

## 2023-12-19 MED ORDER — GLUCOSE BLOOD VI STRP: ORAL_STRIP | 3 refills | Status: AC

## 2023-12-19 MED ORDER — EMPAGLIFLOZIN 10 MG PO TABS: 10.0000 mg | ORAL_TABLET | Freq: Every day | ORAL | 3 refills | Status: AC

## 2023-12-19 NOTE — Patient Instructions (Signed)
 Thank you for coming in today. No change in medications at this time. If there are any concerns on your bloodwork, I will let you know. Take care!

## 2023-12-19 NOTE — Progress Notes (Signed)
 Subjective:  Patient ID: Scott White, male    DOB: Nov 17, 1960  Age: 63 y.o. MRN: [id number]  CC:  Chief Complaint  Patient presents with   Medication Management    Medication management    HPI Scott White presents for   Diabetes: Complicated by hyperglycemia, CAD treated with metformin  500 mg twice daily, Jardiance  10 mg daily.   He is on ACE inhibitor with lisinopril  20 mg daily, SGLT2 with Jardiance  statin with Crestor . Taking cinnamon  No uti/mycotic infection sx's.  Home readings 150-175 Postprandial Lowest 70-80.  Improved exercise past month after prior surgery and some dietary indiscretion - improving.  Microalbumin: Normal in March 2025 Optho, foot exam, pneumovax:  Colonoscopy planned 10/24.  Flu vaccine - today Covid booster - at pharmacy.  Tdap today.   Immunization History  Administered Date(s) Administered   Influenza, Seasonal, Injecte, Preservative Fre 12/19/2023   Influenza-Unspecified 11/17/2012   Janssen (J&J) SARS-COV-2 Vaccination 06/11/2019   PFIZER(Purple Top)SARS-COV-2 Vaccination 01/12/2020   PNEUMOCOCCAL CONJUGATE-20 06/03/2023   Pneumococcal Polysaccharide-23 03/25/2017   Tdap 11/16/2013, 12/19/2023   Zoster Recombinant(Shingrix ) 02/09/2019, 07/18/2019     Lab Results  Component Value Date   HGBA1C 7.5 (H) 06/03/2023   HGBA1C 7.3 (H) 12/03/2022   HGBA1C 6.6 (H) 08/31/2022   Lab Results  Component Value Date   MICROALBUR <0.7 06/03/2023   LDLCALC 49 06/03/2023   CREATININE 0.89 06/03/2023    Hypertension: Lisinopril  20mg  every day. No new cough/side effects. Compliant with CPAP with OSA, complex sleep apnea syndrome.  Dry mouth at times. Does have humidifier.  Plans pulm follow up.  Home readings: 130/80-90 or below.  BP Readings from Last 3 Encounters:  12/19/23 118/80  12/06/23 118/70  11/22/23 136/80   Lab Results  Component Value Date   CREATININE 0.89 06/03/2023     Hyperlipidemia: Goal ldl under 70. Recent  cardiology visit. Hx of CAD, continued crestor  40mg  every day, zetia  10mg  every day. On eliquis  for PAFIB, sp ablation. NSR on EKG. No new bleeding.   Lab Results  Component Value Date   CHOL 112 06/03/2023   HDL 39.30 06/03/2023   LDLCALC 49 06/03/2023   LDLDIRECT 65.0 11/09/2020   TRIG 116.0 06/03/2023   CHOLHDL 3 06/03/2023   Lab Results  Component Value Date   ALT 23 06/03/2023   AST 23 06/03/2023   ALKPHOS 46 06/03/2023   BILITOT 0.7 06/03/2023      History Patient Active Problem List   Diagnosis Date Noted   Type 2 diabetes mellitus with hyperglycemia, without long-term current use of insulin (HCC) 05/31/2022   OSA on CPAP 09/22/2010   Long term (current) use of anticoagulants 07/03/2010   Long term current use of anticoagulant 05/31/2010   Hyperlipidemia 09/27/2008   HYPERTRIGLYCERIDEMIA 02/11/2008   Essential hypertension 02/11/2008   CORONARY ATHEROSCLEROSIS NATIVE CORONARY ARTERY 02/11/2008   ATRIAL FIBRILLATION 02/11/2008   Past Medical History:  Diagnosis Date   Atrial fibrillation (HCC)    a. Persistent, s/p afib ablation by JA 08/16/10;  b. s/p failed DCCV 05/2011; c. Chronic Tikosyn  & Pradaxa  (CHA2DS2VASc = 3-4).   CAD (coronary artery disease)    a. 07/2002 Inf MI s/p BMS -->RCA, otw nonobs dzs;  b. 12/2011 Neg MV.   Cancer [facility])    Phreesia 05/09/2020   CHF (congestive heart failure) (HCC)    Phreesia 05/09/2020   Chronic diastolic CHF (congestive heart failure) (HCC)    Diabetes (HCC)    Diabetes mellitus without complication (  HCC)    Phreesia 05/09/2020   HTN (hypertension)    Hyperlipidemia    Hypertension    Phreesia 05/09/2020   Myocardial infarction (HCC)    Phreesia 05/09/2020   PVC's (premature ventricular contractions)    Sleep apnea    followed by Dr Corrie   Past Surgical History:  Procedure Laterality Date   atrial ablatio  08/16/2010   afib ablation by JA   ATRIAL FIBRILLATION ABLATION N/A 11/29/2020   Procedure: ATRIAL  FIBRILLATION ABLATION;  Surgeon: Scott Agent, MD;  Location: [facility];  Service: Cardiovascular;  Laterality: N/A;   CARDIAC CATHETERIZATION  03/19/2002    Successful PTCA and stent placement in the mid right   coronary artery with an extra 99% narrowing to 0% with placement of a 3.5 x  20 mm Express 2 stent with improvement of TIMI grade 1 flow to TIMI grade 3.   CARDIOVERSION  06/15/2011   Procedure: CARDIOVERSION;  Surgeon: Vina LULLA Gull, MD;  Location: [facility] OR;  Service: Cardiovascular;  Laterality: N/A;   INCISIONAL HERNIA REPAIR Bilateral    Phreesia 05/09/2020   ROTATOR CUFF REPAIR Left    SKIN CANCER EXCISION     TONSILLECTOMY     No Known Allergies Prior to Admission medications   Medication Sig Start Date End Date Taking? Authorizing Provider  apixaban  (ELIQUIS ) 5 MG TABS tablet TAKE 1 TABLET TWICE A DAY 11/15/23  Yes Fountain, Scott L, NP  ezetimibe  (ZETIA ) 10 MG tablet Take 1 tablet (10 mg total) by mouth daily. 12/03/22  Yes Scott Reyes SAUNDERS, MD  glucose blood test strip Use as instructed - 1-2 per day. 03/23/22  Yes Scott Reyes SAUNDERS, MD  HYDROcodone -acetaminophen  (NORCO/VICODIN) 5-325 MG tablet Take 1-2 tablets by mouth every 6 (six) hours as needed. 03/19/23  Yes Scott Agent, MD  JARDIANCE  10 MG TABS tablet TAKE 1 TABLET DAILY BEFORE BREAKFAST 01/14/23  Yes Scott Reyes SAUNDERS, MD  lisinopril  (ZESTRIL ) 20 MG tablet TAKE 1 TABLET DAILY IN THE EVENING 12/04/23  Yes Scott Reyes SAUNDERS, MD  Magnesium 100 MG TABS Take 400 mg by mouth 2 (two) times daily.   Yes [provider]  metFORMIN  (GLUCOPHAGE ) 1000 MG tablet TAKE 1 TABLET TWICE A DAY WITH MEALS 05/27/23  Yes Scott Reyes SAUNDERS, MD  Multiple Vitamins-Minerals (CENTRUM PO) Take 1 tablet by mouth daily.   Yes [provider]  Na Sulfate-K Sulfate-Mg Sulfate concentrate (SUPREP BOWEL PREP  KIT) 17.5-3.13-1.6 GM/177ML SOLN Take 1 kit (354 mLs total) by mouth as directed. For colonoscopy prep 11/22/23  Yes Scott Camie BRAVO, PA-C  nitroGLYCERIN  (NITROSTAT ) 0.4 MG SL tablet Place 1 tablet (0.4 mg total) under the tongue every 5 (five) minutes as needed. For chest pain at 3rd dose call 911 06/09/19  Yes Crenshaw, Redell RAMAN, MD  Omega-3 Fatty Acids (FISH OIL PO) Take 1,400 mg by mouth 2 (two) times daily.   Yes [provider]  rosuvastatin  (CRESTOR ) 40 MG tablet TAKE 1 TABLET DAILY 05/27/23  Yes Scott Reyes SAUNDERS, MD  tadalafil  (CIALIS ) 10 MG tablet TAKE 1 TABLET DAILY AS NEEDED FOR ERECTILE DYSFUNCTION. DO NOT USE IF TAKING NITROGLYCERIN  02/25/23  Yes Scott Reyes SAUNDERS, MD   Social History   Socioeconomic History   Marital status: Single    Spouse name: Not on file   Number of children: 0   Years of education: Not on file   Highest education level: Bachelor's degree (e.g., BA, AB, BS)  Occupational History  Occupation: Engineer, mining: [employer]  Tobacco Use   Smoking status: Never   Smokeless tobacco: Never  Vaping Use   Vaping status: Never Used  Substance and Sexual Activity   Alcohol use: Yes    Alcohol/week: 1.0 standard drink of alcohol    Types: 1 Cans of beer per week    Comment:  three times a week   Drug use: No   Sexual activity: Yes  Other Topics Concern   Not on file  Social History Narrative   Pt lives in [place] alone.  Single.   He is a Engineer, water for [employer]   Social Drivers of Health   Financial Resource Strain: Low Risk  (12/19/2023)   Overall Financial Resource Strain (CARDIA)    Difficulty of Paying Living Expenses: Not hard at all  Food Insecurity: No Food Insecurity (12/19/2023)   Hunger Vital Sign    Worried About Running Out of Food in the Last Year: Never true    Ran Out of Food in the Last Year: Never true  Transportation Needs: No Transportation Needs (12/19/2023)   PRAPARE - Administrator, Civil Service (Medical): No    Lack of Transportation (Non-Medical): No  Physical Activity: Insufficiently  Active (12/19/2023)   Exercise Vital Sign    Days of Exercise per Week: 2 days    Minutes of Exercise per Session: 60 min  Stress: No Stress Concern Present (12/19/2023)   [employer] of Occupational Health - Occupational Stress Questionnaire    Feeling of Stress: Only a little  Social Connections: Moderately Isolated (12/19/2023)   Social Connection and Isolation Panel    Frequency of Communication with Friends and Family: More than three times a week    Frequency of Social Gatherings with Friends and Family: Twice a week    Attends Religious Services: 1 to 4 times per year    Active Member of [employer] or Organizations: No    Attends Engineer, structural: Not on file    Marital Status: Never married  Intimate Partner Violence: Not on file    Review of Systems  Constitutional:  Negative for fatigue and unexpected weight change.  Eyes:  Negative for visual disturbance.  Respiratory:  Negative for cough, chest tightness and shortness of breath.   Cardiovascular:  Negative for chest pain, palpitations and leg swelling.  Gastrointestinal:  Negative for abdominal pain and blood in stool.  Neurological:  Negative for dizziness, light-headedness and headaches.     Objective:   Vitals:   12/19/23 1138  BP: 118/80  Pulse: 66  Temp: 98 F (36.7 C)  SpO2: 96%  Weight: 203 lb 12.8 oz (92.4 kg)  Height: 5' 11 (1.803 m)     Physical Exam Vitals reviewed.  Constitutional:      Appearance: He is well-developed.  HENT:     Head: Normocephalic and atraumatic.  Neck:     Vascular: No carotid bruit or JVD.  Cardiovascular:     Rate and Rhythm: Normal rate and regular rhythm.     Heart sounds: Normal heart sounds. No murmur heard. Pulmonary:     Effort: Pulmonary effort is normal.     Breath sounds: Normal breath sounds. No rales.  Musculoskeletal:     Right lower leg: No edema.     Left lower leg: No edema.  Skin:    General: Skin is warm and dry.  Neurological:      Mental  Status: He is alert and oriented to person, place, and time.  Psychiatric:        Mood and Affect: Mood normal.        Assessment & Plan:  Scott White is a 63 y.o. male . Type 2 diabetes mellitus with hyperglycemia, without long-term current use of insulin (HCC) - Plan: glucose blood test strip, empagliflozin  (JARDIANCE ) 10 MG TABS tablet, Hemoglobin A1c  -Tolerating current regimen.  May need to adjust depending on A1c but he has increased exercise recently, some positive health changes so if borderline may decide to continue monitoring on current regimen.  Plan to be decided by lab results.  Pure hypercholesterolemia - Plan: ezetimibe  (ZETIA ) 10 MG tablet, Comprehensive metabolic panel with GFR, Lipid panel  - Tolerating current regimen, check labs and adjust plan accordingly.  Recent cardiology visit as above.  No changes.  Essential hypertension  - Stable with current regimen, no changes.  OSA on CPAP  - Compliant with CPAP.  Dry mouth noted as above, he plans to follow-up with pulmonary as may need updated visit for new equipment if that were needed.  Continue consistent use of CPAP.  Does have humidifier in place.  Atrial fibrillation, unspecified type (HCC)  - Asymptomatic, tolerating anticoagulation, no changes.  Need for Tdap vaccination - Plan: Tdap vaccine greater than or equal to 7yo IM  Flu vaccine need - Plan: Flu vaccine trivalent PF, 6mos and older(Flulaval,Afluria,Fluarix,Fluzone)   Meds ordered this encounter  Medications   ezetimibe  (ZETIA ) 10 MG tablet    Sig: Take 1 tablet (10 mg total) by mouth daily.    Dispense:  90 tablet    Refill:  3   glucose blood test strip    Sig: Use as instructed - 1-2 per day.    Dispense:  70 each    Refill:  3    Dx. E11.65. Freestyle Lite Brand, Free Style Lite by [employer]   empagliflozin  (JARDIANCE ) 10 MG TABS tablet    Sig: Take 1 tablet (10 mg total) by mouth daily before breakfast.    Dispense:  90  tablet    Refill:  3   Patient Instructions  Thank you for coming in today. No change in medications at this time. If there are any concerns on your bloodwork, I will let you know. Take care!     Signed,   Reyes Pines, MD [facility], [facility] 12/19/23 1:15 PM

## 2023-12-24 ENCOUNTER — Ambulatory Visit: Payer: Self-pay | Admitting: Family Medicine

## 2024-01-02 ENCOUNTER — Ambulatory Visit: Admitting: Family Medicine

## 2024-01-03 ENCOUNTER — Encounter: Payer: Self-pay | Admitting: Internal Medicine

## 2024-01-09 NOTE — Progress Notes (Unsigned)
 [name] History and Physical   Primary Care Physician:  Scott Reyes SAUNDERS, MD   Reason for Procedure:  Colon cancer screening  Plan:    Colonoscopy     HPI: Scott White is a 63 y.o. male with a history of atrial fibrillation on Eliquis , presenting for screening colonoscopy.  Eliquis  has been held.  The patient also does have a history of hemorrhoids with some bleeding.  Colonoscopy in 2015 negative for neoplasia.   Past Medical History:  Diagnosis Date   Atrial fibrillation (HCC)    a. Persistent, s/p afib ablation by JA 08/16/10;  b. s/p failed DCCV 05/2011; c. Chronic Tikosyn  & Pradaxa  (CHA2DS2VASc = 3-4).   CAD (coronary artery disease)    a. 07/2002 Inf MI s/p BMS -->RCA, otw nonobs dzs;  b. 12/2011 Neg MV.   Cancer (HCC)    Phreesia 05/09/2020   CHF (congestive heart failure) (HCC)    Phreesia 05/09/2020   Chronic diastolic CHF (congestive heart failure) (HCC)    Diabetes (HCC)    Diabetes mellitus without complication (HCC)    Phreesia 05/09/2020   HTN (hypertension)    Hyperlipidemia    Hypertension    Phreesia 05/09/2020   Myocardial infarction (HCC)    Phreesia 05/09/2020   PVC's (premature ventricular contractions)    Sleep apnea    followed by Dr Corrie    Past Surgical History:  Procedure Laterality Date   atrial ablatio  08/16/2010   afib ablation by JA   ATRIAL FIBRILLATION ABLATION N/A 11/29/2020   Procedure: ATRIAL FIBRILLATION ABLATION;  Surgeon: Kelsie Agent, MD;  Location: [facility] INV[facility] LAB;  Service: Cardiovascular;  Laterality: N/A;   CARDIAC CATHETERIZATION  03/19/2002    Successful PTCA and stent placement in the mid right   coronary artery with an extra 99% narrowing to 0% with placement of a 3.5 x  20 mm Express 2 stent with improvement of TIMI grade 1 flow to TIMI grade 3.   CARDIOVERSION  06/15/2011   Procedure: CARDIOVERSION;  Surgeon: Vina LULLA Gull, MD;  Location: [facility] OR;  Service: Cardiovascular;  Laterality: N/A;    INCISIONAL HERNIA REPAIR Bilateral    Phreesia 05/09/2020   ROTATOR CUFF REPAIR Left    SKIN CANCER EXCISION     TONSILLECTOMY       Current Outpatient Medications  Medication Sig Dispense Refill   glucose blood test strip Use as instructed - 1-2 per day. 70 each 3   lisinopril  (ZESTRIL ) 20 MG tablet TAKE 1 TABLET DAILY IN THE EVENING 90 tablet 3   Magnesium 100 MG TABS Take 400 mg by mouth 2 (two) times daily.     metFORMIN  (GLUCOPHAGE ) 1000 MG tablet TAKE 1 TABLET TWICE A DAY WITH MEALS 180 tablet 3   Multiple Vitamins-Minerals (CENTRUM PO) Take 1 tablet by mouth daily.     Omega-3 Fatty Acids (FISH OIL PO) Take 1,400 mg by mouth 2 (two) times daily.     apixaban  (ELIQUIS ) 5 MG TABS tablet TAKE 1 TABLET TWICE A DAY 180 tablet 1   empagliflozin  (JARDIANCE ) 10 MG TABS tablet Take 1 tablet (10 mg total) by mouth daily before breakfast. 90 tablet 3   ezetimibe  (ZETIA ) 10 MG tablet Take 1 tablet (10 mg total) by mouth daily. 90 tablet 3   HYDROcodone -acetaminophen  (NORCO/VICODIN) 5-325 MG tablet Take 1-2 tablets by mouth every 6 (six) hours as needed. 10 tablet 0   nitroGLYCERIN  (NITROSTAT ) 0.4 MG SL tablet Place 1 tablet (0.4 mg  total) under the tongue every 5 (five) minutes as needed. For chest pain at 3rd dose call 911 25 tablet 4   rosuvastatin  (CRESTOR ) 40 MG tablet TAKE 1 TABLET DAILY 90 tablet 3   tadalafil  (CIALIS ) 10 MG tablet TAKE 1 TABLET DAILY AS NEEDED FOR ERECTILE DYSFUNCTION. DO NOT USE IF TAKING NITROGLYCERIN  24 tablet 5   Current Facility-Administered Medications  Medication Dose Route Frequency Provider Last Rate Last Admin   0.9 %  sodium chloride  infusion  500 mL Intravenous Once Avram Lupita BRAVO, MD        Allergies as of 01/10/2024   (No Known Allergies)    Family History  Problem Relation Age of Onset   Coronary artery disease Mother    Heart failure Mother    Heart disease Mother    Hyperlipidemia Mother    Hypertension Mother    Alzheimer's disease Father     Diabetes Father    Diabetes Brother    Heart disease Brother    Hyperlipidemia Brother    Hypertension Brother    Alzheimer's disease Brother    Hyperlipidemia Brother    Colon cancer Neg Hx    Pancreatic cancer Neg Hx    Rectal cancer Neg Hx    Stomach cancer Neg Hx    Esophageal cancer Neg Hx     Social History   Socioeconomic History   Marital status: Single    Spouse name: Not on file   Number of children: 0   Years of education: Not on file   Highest education level: Bachelor's degree (e.g., BA, AB, BS)  Occupational History   Occupation: Engineer, 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  Single.   He is a Engineer, water for [employer]   Social Drivers of Health   Financial Resource Strain: Low Risk  (12/19/2023)   Overall Financial Resource Strain (CARDIA)    Difficulty of Paying Living Expenses: Not hard at all  Food Insecurity: No Food Insecurity (12/19/2023)   Hunger Vital Sign    Worried About Running Out of Food in the Last Year: Never true    Ran Out of Food in the Last Year: Never true  Transportation Needs: No Transportation Needs (12/19/2023)   PRAPARE - Administrator, Civil Service (Medical): No    Lack of Transportation (Non-Medical): No  Physical Activity: Insufficiently Active (12/19/2023)   Exercise Vital Sign    Days of Exercise per Week: 2 days    Minutes of Exercise per Session: 60 min  Stress: No Stress Concern Present (12/19/2023)   [employer] of Occupational Health - Occupational Stress Questionnaire    Feeling  of Stress: Only a little  Social Connections: Moderately Isolated (12/19/2023)   Social Connection and Isolation Panel    Frequency of Communication with Friends and Family: More than three times a week    Frequency of Social Gatherings with Friends and Family: Twice a week  Attends Religious Services: 1 to 4 times per year    Active Member of Clubs or Organizations: No    Attends Engineer, structural: Not on file    Marital Status: Never married  Intimate Partner Violence: Not on file    Review of Systems:  All other review of systems negative except as mentioned in the HPI.  Physical Exam: Vital signs BP 122/72   Pulse 75   Temp 98 F (36.7 C)   Ht 5' 11 (1.803 m)   Wt 201 lb 6.4 oz (91.4 kg)   SpO2 98%   BMI 28.09 kg/m   General:   Alert,  Well-developed, well-nourished, pleasant and cooperative in NAD Lungs:  Clear throughout to auscultation.   Heart:  Regular rate and rhythm; no murmurs, clicks, rubs,  or gallops. Abdomen:  Soft, nontender and nondistended. Normal bowel sounds.   Neuro/Psych:  Alert and cooperative. Normal mood and affect. A and O x 3   @Conor Lata  CHARLENA Commander, MD, [facility] [phone] (pager) 01/10/2024 11:47 AM@

## 2024-01-10 ENCOUNTER — Encounter: Payer: Self-pay | Admitting: Internal Medicine

## 2024-01-10 ENCOUNTER — Ambulatory Visit: Admitting: Internal Medicine

## 2024-01-10 VITALS — BP 106/61 | HR 65 | Temp 98.0°F | Resp 15 | Ht 71.0 in | Wt 201.4 lb

## 2024-01-10 DIAGNOSIS — D124 Benign neoplasm of descending colon: Secondary | ICD-10-CM

## 2024-01-10 DIAGNOSIS — K635 Polyp of colon: Secondary | ICD-10-CM

## 2024-01-10 DIAGNOSIS — K5730 Diverticulosis of large intestine without perforation or abscess without bleeding: Secondary | ICD-10-CM | POA: Diagnosis not present

## 2024-01-10 DIAGNOSIS — K644 Residual hemorrhoidal skin tags: Secondary | ICD-10-CM | POA: Diagnosis not present

## 2024-01-10 DIAGNOSIS — D122 Benign neoplasm of ascending colon: Secondary | ICD-10-CM | POA: Diagnosis not present

## 2024-01-10 DIAGNOSIS — K648 Other hemorrhoids: Secondary | ICD-10-CM

## 2024-01-10 DIAGNOSIS — Z860101 Personal history of adenomatous and serrated colon polyps: Secondary | ICD-10-CM | POA: Insufficient documentation

## 2024-01-10 DIAGNOSIS — Z1211 Encounter for screening for malignant neoplasm of colon: Secondary | ICD-10-CM

## 2024-01-10 DIAGNOSIS — D125 Benign neoplasm of sigmoid colon: Secondary | ICD-10-CM

## 2024-01-10 MED ORDER — SODIUM CHLORIDE 0.9 % IV SOLN: 500.0000 mL | Freq: Once | INTRAVENOUS | Status: DC

## 2024-01-10 NOTE — Progress Notes (Signed)
 Called to room to assist during endoscopic procedure.  Patient ID and intended procedure confirmed with present staff. Received instructions for my participation in the procedure from the performing physician.

## 2024-01-10 NOTE — Patient Instructions (Addendum)
 I found and removed 3 small polyps that look benign.  I will let you know pathology results and when to have another routine colonoscopy by mail and/or My Chart.  You also have diverticulosis which are pockets or pouches in the colon, quite common and not typically a problem for people.  Please read the handout.  Internal and external hemorrhoids also seen, as you are aware that you have.I can arrange for treatment of internal hemorrhoids with hemorrhoid banding procedure if they continue to bother you.  Please resume Eliquis  tomorrow.  I appreciate the opportunity to care for you. Lupita CHARLENA Commander, MD, FACG  YOU HAD AN ENDOSCOPIC PROCEDURE TODAY AT THE  ENDOSCOPY CENTER:   Refer to the procedure report that was given to you for any specific questions about what was found during the examination.  If the procedure report does not answer your questions, please call your gastroenterologist to clarify.  If you requested that your care partner not be given the details of your procedure findings, then the procedure report has been included in a sealed envelope for you to review at your convenience later.  YOU SHOULD EXPECT: Some feelings of bloating in the abdomen. Passage of more gas than usual.  Walking can help get rid of the air that was put into your GI tract during the procedure and reduce the bloating. If you had a lower endoscopy (such as a colonoscopy or flexible sigmoidoscopy) you may notice spotting of blood in your stool or on the toilet paper. If you underwent a bowel prep for your procedure, you may not have a normal bowel movement for a few days.  Please Note:  You might notice some irritation and congestion in your nose or some drainage.  This is from the oxygen used during your procedure.  There is no need for concern and it should clear up in a day or so.  SYMPTOMS TO REPORT IMMEDIATELY:  Following lower endoscopy (colonoscopy or flexible sigmoidoscopy):  Excessive amounts of  blood in the stool  Significant tenderness or worsening of abdominal pains  Swelling of the abdomen that is new, acute  Fever of 100F or higher   For urgent or emergent issues, a gastroenterologist can be reached at any hour by calling [phone]. Do not use MyChart messaging for urgent concerns.    DIET:  We do recommend a small meal at first, but then you may proceed to your regular diet.  Drink plenty of fluids but you should avoid alcoholic beverages for 24 hours.  ACTIVITY:  You should plan to take it easy for the rest of today and you should NOT DRIVE or use heavy machinery until tomorrow (because of the sedation medicines used during the test).    FOLLOW UP: Our staff will call the number listed on your records the next business day following your procedure.  We will call around 7:15- 8:00 am to check on you and address any questions or concerns that you may have regarding the information given to you following your procedure. If we do not reach you, we will leave a message.     If any biopsies were taken you will be contacted by phone or by letter within the next 1-3 weeks.  Please call us  at [phone] if you have not heard about the biopsies in 3 weeks.    SIGNATURES/CONFIDENTIALITY: You and/or your care partner have signed paperwork which will be entered into your electronic medical record.  These signatures attest to  the fact that that the information above on your After Visit Summary has been reviewed and is understood.  Full responsibility of the confidentiality of this discharge information lies with you and/or your care-partner.

## 2024-01-10 NOTE — Progress Notes (Signed)
 To PACU via stretcher, sedated, good respiratory effort, VSS.

## 2024-01-10 NOTE — Op Note (Addendum)
 [facility] Patient Name: Scott White Procedure Date: 01/10/2024 11:37 AM MRN: [id number] Endoscopist: Lupita FORBES Commander , MD, [id number] Age: 63 Referring MD:  Date of Birth: 1960-06-04 Gender: Male Account #: [id number] Procedure:                Colonoscopy Indications:              Screening for colorectal malignant neoplasm, Last                            colonoscopy: 2015 Medicines:                Monitored Anesthesia Care Procedure:                Pre-Anesthesia Assessment:                           - Prior to the procedure, a History and Physical                            was performed, and patient medications and                            allergies were reviewed. The patient's tolerance of                            previous anesthesia was also reviewed. The risks                            and benefits of the procedure and the sedation                            options and risks were discussed with the patient.                            All questions were answered, and informed consent                            was obtained. Prior Anticoagulants: The patient                            last took Eliquis  (apixaban ) 2 days prior to the                            procedure. ASA Grade Assessment: III - A patient                            with severe systemic disease. After reviewing the                            risks and benefits, the patient was deemed in                            satisfactory condition to undergo the procedure.  After obtaining informed consent, the colonoscope                            was passed under direct vision. Throughout the                            procedure, the patient's blood pressure, pulse, and                            oxygen saturations were monitored continuously. The                            CF [id number] #[id number] was introduced through the anus                            and advanced to the the  cecum, identified by                            appendiceal orifice and ileocecal valve. The                            colonoscopy was performed without difficulty. The                            patient tolerated the procedure well. The quality                            of the bowel preparation was good. The ileocecal                            valve, appendiceal orifice, and rectum were                            photographed. The bowel preparation used was SUPREP                            via split dose instruction. Scope In: 11:55:00 AM Scope Out: 12:12:30 PM Scope Withdrawal Time: 0 hours 13 minutes 21 seconds  Total Procedure Duration: 0 hours 17 minutes 30 seconds  Findings:                 The perianal and digital rectal examinations were                            normal except for small external hemorrhoid tagsa..                            Pertinent negatives include normal prostate (size,                            shape, and consistency).                           Three sessile polyps were found in the sigmoid  colon, descending colon and ascending colon. The                            polyps were 5 mm in size. These polyps were removed                            with a cold snare. Resection and retrieval were                            complete. Verification of patient identification                            for the specimen was done. Estimated blood loss was                            minimal.                           Multiple medium-mouthed diverticula were found in                            the sigmoid colon, descending colon and transverse                            colon.                           Internal hemorrhoids were found.                           The exam was otherwise without abnormality on                            direct and retroflexion views. Complications:            No immediate complications. Estimated Blood Loss:      Estimated blood loss was minimal. Impression:               - Three 5 mm polyps in the sigmoid colon, in the                            descending colon and in the ascending colon,                            removed with a cold snare. Resected and retrieved.                           - Diverticulosis in the sigmoid colon, in the                            descending colon and in the transverse colon.                           - Internal and external hemorrhoids.                           -  The examination was otherwise normal on direct                            and retroflexion views. Recommendation:           - Patient has a contact number available for                            emergencies. The signs and symptoms of potential                            delayed complications were discussed with the                            patient. Return to normal activities tomorrow.                            Written discharge instructions were provided to the                            patient.                           - Resume previous diet.                           - Continue present medications.                           - Resume Eliquis  (apixaban ) at prior dose tomorrow.                           - Will offer hemorrhoid banding - discussed in                            recovery - he has Grade 2 prolapse symptoms and is                            interested in banding - explained that my office                            staff will make a banding appointment and handout                            provided. Lupita FORBES Commander, MD 01/10/2024 12:22:33 PM This report has been signed electronically.

## 2024-01-13 ENCOUNTER — Telehealth: Payer: Self-pay

## 2024-01-13 NOTE — Telephone Encounter (Signed)
  Follow up Call-     01/10/2024   11:00 AM  Call back number  Post procedure Call Back phone  # [phone]  Permission to leave phone message Yes     Left message

## 2024-01-13 NOTE — Telephone Encounter (Signed)
 I left Scott White a detailed message to call us  back and set up a banding appointment.

## 2024-01-13 NOTE — Telephone Encounter (Signed)
-----   Message from Lupita Commander sent at 01/10/2024 12:38 PM EDT ----- Regarding: Schedule ending appointment You will see in his colonoscopy report but please set him up for hemorrhoid banding appointment at his convenience

## 2024-01-15 LAB — SURGICAL PATHOLOGY

## 2024-01-17 ENCOUNTER — Ambulatory Visit: Admitting: Family Medicine

## 2024-01-23 ENCOUNTER — Ambulatory Visit: Payer: Self-pay | Admitting: Internal Medicine

## 2024-01-23 ENCOUNTER — Encounter: Payer: Self-pay | Admitting: Internal Medicine

## 2024-01-23 DIAGNOSIS — Z860101 Personal history of adenomatous and serrated colon polyps: Secondary | ICD-10-CM

## 2024-02-07 NOTE — Telephone Encounter (Signed)
 Scheduled patient for first hemorrhoid banding appointment. Mychart message sent to patient.

## 2024-02-20 ENCOUNTER — Other Ambulatory Visit: Payer: Self-pay | Admitting: Family Medicine

## 2024-02-20 DIAGNOSIS — N529 Male erectile dysfunction, unspecified: Secondary | ICD-10-CM

## 2024-03-11 ENCOUNTER — Encounter: Admitting: Family Medicine

## 2024-03-20 ENCOUNTER — Ambulatory Visit: Admitting: Family Medicine

## 2024-03-24 ENCOUNTER — Encounter: Payer: Self-pay | Admitting: Internal Medicine

## 2024-03-24 ENCOUNTER — Ambulatory Visit (INDEPENDENT_AMBULATORY_CARE_PROVIDER_SITE_OTHER): Admitting: Internal Medicine

## 2024-03-24 VITALS — BP 138/82 | HR 82 | Ht 71.0 in | Wt 205.0 lb

## 2024-03-24 DIAGNOSIS — K641 Second degree hemorrhoids: Secondary | ICD-10-CM | POA: Diagnosis not present

## 2024-03-24 NOTE — Patient Instructions (Signed)
HEMORRHOID BANDING PROCEDURE    FOLLOW-UP CARE   The procedure you have had should have been relatively painless since the banding of the area involved does not have nerve endings and there is no pain sensation.  The rubber band cuts off the blood supply to the hemorrhoid and the band may fall off as soon as 48 hours after the banding (the band may occasionally be seen in the toilet bowl following a bowel movement). You may notice a temporary feeling of fullness in the rectum which should respond adequately to plain Tylenol or Motrin.  Following the banding, avoid strenuous exercise that evening and resume full activity the next day.  A sitz bath (soaking in a warm tub) or bidet is soothing, and can be useful for cleansing the area after bowel movements.     To avoid constipation, take two tablespoons of natural wheat bran, natural oat bran, flax, Benefiber or any over the counter fiber supplement and increase your water intake to 7-8 glasses daily.    Unless you have been prescribed anorectal medication, do not put anything inside your rectum for two weeks: No suppositories, enemas, fingers, etc.  Occasionally, you may have more bleeding than usual after the banding procedure.  This is often from the untreated hemorrhoids rather than the treated one.  Don't be concerned if there is a tablespoon or so of blood.  If there is more blood than this, lie flat with your bottom higher than your head and apply an ice pack to the area. If the bleeding does not stop within a half an hour or if you feel faint, call our office at [phone] or go to the emergency room.  Problems are not common; however, if there is a substantial amount of bleeding, severe pain, chills, fever or difficulty passing urine (very rare) or other problems, you should call us at [phone] or report to the nearest emergency room.  Do not stay seated continuously for more than 2-3 hours for a day or two after the procedure.   Tighten your buttock muscles 10-15 times every two hours and take 10-15 deep breaths every 1-2 hours.  Do not spend more than a few minutes on the toilet if you cannot empty your bowel; instead re-visit the toilet at a later time.  I appreciate the opportunity to care for you. Carl Gessner, MD, FACG  

## 2024-03-24 NOTE — Progress Notes (Signed)
"      ° ° °  PROCEDURE NOTE: The patient presents with symptomatic grade 2  hemorrhoids, requesting rubber band ligation of his/her hemorrhoidal disease.  All risks, benefits and alternative forms of therapy were described and informed consent was obtained.  Rectal - small RP and RA old external hemorrhoid tags. Slight bulge of anus at LL. DRE palpable LL>RP internal hemorrhoids  In the Left Lateral Decubitus position anoscopic examination revealed grade 2 hemorrhoids in the all position(s).  The anorectum was pre-medicated with 5% lidocaine  The decision was made to band the LL internal hemorrhoid, and the [facility] ORegan System was used to perform band ligation without complication.  Digital anorectal examination was then performed to assure proper positioning of the band, and to adjust the banded tissue as required.  The patient was discharged home without pain or other issues.  Dietary and behavioral recommendations were given and along with follow-up instructions.     The following adjunctive treatments were recommended:  Benefiber 1 tbsp daily  The patient will return in 2 weeks for  follow-up and possible additional banding as required. No complications were encountered and the patient tolerated the procedure well.  "

## 2024-04-07 ENCOUNTER — Ambulatory Visit: Admitting: Internal Medicine

## 2024-04-07 ENCOUNTER — Encounter: Payer: Self-pay | Admitting: Internal Medicine

## 2024-04-07 VITALS — BP 114/70 | HR 83 | Ht 71.0 in | Wt 200.2 lb

## 2024-04-07 DIAGNOSIS — K641 Second degree hemorrhoids: Secondary | ICD-10-CM | POA: Diagnosis not present

## 2024-04-07 NOTE — Patient Instructions (Addendum)
 HEMORRHOID BANDING PROCEDURE    FOLLOW-UP CARE   The procedure you have had should have been relatively painless since the banding of the area involved does not have nerve endings and there is no pain sensation.  The rubber band cuts off the blood supply to the hemorrhoid and the band may fall off as soon as 48 hours after the banding (the band may occasionally be seen in the toilet bowl following a bowel movement). You may notice a temporary feeling of fullness in the rectum which should respond adequately to plain Tylenol  or Motrin.  Following the banding, avoid strenuous exercise that evening and resume full activity the next day.  A sitz bath (soaking in a warm tub) or bidet is soothing, and can be useful for cleansing the area after bowel movements.     To avoid constipation, take two tablespoons of natural wheat bran, natural oat bran, flax, Benefiber or any over the counter fiber supplement and increase your water intake to 7-8 glasses daily.  STAY ON YOUR METAMUCIL.   Unless you have been prescribed anorectal medication, do not put anything inside your rectum for two weeks: No suppositories, enemas, fingers, etc.  Occasionally, you may have more bleeding than usual after the banding procedure.  This is often from the untreated hemorrhoids rather than the treated one.  Dont be concerned if there is a tablespoon or so of blood.  If there is more blood than this, lie flat with your bottom higher than your head and apply an ice pack to the area. If the bleeding does not stop within a half an hour or if you feel faint, call our office at [phone] or go to the emergency room.  Problems are not common; however, if there is a substantial amount of bleeding, severe pain, chills, fever or difficulty passing urine (very rare) or other problems, you should call us  at [phone] or report to the nearest emergency room.  Do not stay seated continuously for more than 2-3 hours for a day or  two after the procedure.  Tighten your buttock muscles 10-15 times every two hours and take 10-15 deep breaths every 1-2 hours.  Do not spend more than a few minutes on the toilet if you cannot empty your bowel; instead re-visit the toilet at a later time.  I appreciate the opportunity to care for you. Lupita Commander, MD, [facility]

## 2024-04-07 NOTE — Progress Notes (Signed)
"      ° ° °  PROCEDURE NOTE: The patient presents with symptomatic grade 2  hemorrhoids, requesting rubber band ligation of his/her hemorrhoidal disease.  All risks, benefits and alternative forms of therapy were described and informed consent was obtained.   The anorectum was pre-medicated with 5% lidocine The decision was made to band the RP internal hemorrhoid, and the [facility] ORegan System was used to perform band ligation without complication.  Digital anorectal examination was then performed to assure proper positioning of the band, and to adjust the banded tissue as required.  The patient was discharged home without pain or other issues.  Dietary and behavioral recommendations were given and along with follow-up instructions.     The following adjunctive treatments were recommended:  psyllium  The patient will return 06/12/2024 for  follow-up and possible additional banding as required. No complications were encountered and the patient tolerated the procedure well.  "

## 2024-06-04 ENCOUNTER — Encounter: Admitting: Family Medicine

## 2024-06-12 ENCOUNTER — Encounter: Admitting: Internal Medicine
